# Patient Record
Sex: Male | Born: 1950 | Race: White | Hispanic: No | Marital: Married | Smoking: Never smoker
Health system: Southern US, Community
[De-identification: ages and names within clinical notes are randomized; demographics above are authoritative.]

## PROBLEM LIST (undated history)

## (undated) DIAGNOSIS — F0390 Unspecified dementia without behavioral disturbance: Secondary | ICD-10-CM

## (undated) DIAGNOSIS — N189 Chronic kidney disease, unspecified: Secondary | ICD-10-CM

## (undated) DIAGNOSIS — E109 Type 1 diabetes mellitus without complications: Secondary | ICD-10-CM

## (undated) DIAGNOSIS — G8929 Other chronic pain: Secondary | ICD-10-CM

## (undated) DIAGNOSIS — C61 Malignant neoplasm of prostate: Secondary | ICD-10-CM

## (undated) DIAGNOSIS — E039 Hypothyroidism, unspecified: Secondary | ICD-10-CM

## (undated) DIAGNOSIS — N183 Chronic kidney disease, stage 3 unspecified: Secondary | ICD-10-CM

## (undated) DIAGNOSIS — E079 Disorder of thyroid, unspecified: Secondary | ICD-10-CM

## (undated) DIAGNOSIS — M545 Other chronic pain: Secondary | ICD-10-CM

## (undated) DIAGNOSIS — N529 Male erectile dysfunction, unspecified: Secondary | ICD-10-CM

## (undated) DIAGNOSIS — G5603 Carpal tunnel syndrome, bilateral upper limbs: Secondary | ICD-10-CM

## (undated) DIAGNOSIS — E119 Type 2 diabetes mellitus without complications: Secondary | ICD-10-CM

## (undated) DIAGNOSIS — G629 Polyneuropathy, unspecified: Secondary | ICD-10-CM

## (undated) HISTORY — PX: FOOT SURGERY: SHX648

## (undated) HISTORY — DX: Malignant neoplasm of prostate: C61

## (undated) HISTORY — PX: APPENDECTOMY: SHX54

## (undated) HISTORY — PX: LUMBAR FUSION: SHX111

## (undated) HISTORY — DX: Disorder of thyroid, unspecified: E07.9

---

## 2000-06-14 HISTORY — PX: OTHER SURGICAL HISTORY: SHX169

## 2008-06-14 HISTORY — PX: PROSTATE CRYOABLATION: SUR358

## 2009-06-14 DIAGNOSIS — C61 Malignant neoplasm of prostate: Secondary | ICD-10-CM | POA: Insufficient documentation

## 2016-12-09 ENCOUNTER — Ambulatory Visit (INDEPENDENT_AMBULATORY_CARE_PROVIDER_SITE_OTHER): Payer: BLUE CROSS/BLUE SHIELD | Admitting: Urology

## 2016-12-09 ENCOUNTER — Encounter: Payer: Self-pay | Admitting: Urology

## 2016-12-09 VITALS — BP 127/71 | HR 73 | Ht 73.0 in | Wt 193.1 lb

## 2016-12-09 DIAGNOSIS — N5237 Erectile dysfunction following prostate ablative therapy: Secondary | ICD-10-CM

## 2016-12-09 DIAGNOSIS — R229 Localized swelling, mass and lump, unspecified: Secondary | ICD-10-CM

## 2016-12-09 DIAGNOSIS — C61 Malignant neoplasm of prostate: Secondary | ICD-10-CM | POA: Diagnosis not present

## 2016-12-09 NOTE — Progress Notes (Signed)
12/09/2016 10:06 AM   Laural Hess [birth year] [id number]  Referring provider: Glendon Axe, MD [facility] [place], [place] [address]  Chief Complaint  Patient presents with  . Erectile Dysfunction    HPI: The patient is a 66 year old gentleman with a past medical history of prostate cancer treated with cryosurgery in February 2011 presents today to discuss his erectile dysfunction.  1. Erectile dysfunction The patient has erectile dysfunction since cryotherapy for his prostate. He is not very concerned by this. His only concern was that he has been told by his previous urologist that this would help with his circulation. He is cold all the time, and his previous urologist told him that his erections would improve this. He is not currently sexually active and does not desire erection for intercourse.  2. History of prostate cancer   PSA was 0.02 in June 2018. He had cryotherapy in 2011 in [place], [place]. He is unaware of his Gleason score. Until today he had never heard the term before. He believes his pretreatment PSA was approximately 15. He states that when he was diagnosed with prostate cancer he was called in emergently and taken back for cryotherapy. He states that he was not informed of other treatment options including surgery or radiation therapy. He was told that this was an emergent surgery needed to treat his prostate cancer. He has never had imaging for his prostate cancer and again does not know what his Gleason score was.  3. Skin nodules The patient has skin nodules throughout his abdomen and chest that are superficial. He was told by his previous urologist that these are results of his cryotherapy and that they would eventually go away.   PMH: Past Medical History:  Diagnosis Date  . Prostate cancer ([place])   . Thyroid disease     Surgical History: Past Surgical History:  Procedure Laterality Date  . PROSTATE CRYOABLATION  2010     Home Medications:  Allergies as of 12/09/2016   No Known Allergies     Medication List       Accurate as of 12/09/16 10:06 AM. Always use your most recent med list.          cholecalciferol 1000 units tablet Commonly known as:  VITAMIN D Take by mouth.   levothyroxine 100 MCG tablet Commonly known as:  SYNTHROID, LEVOTHROID Take by mouth.   Vitamin B 12 100 MCG Lozg Take by mouth.       Allergies: No Known Allergies  Family History: Family History  Problem Relation Age of Onset  . Prostate cancer Neg Hx   . Bladder Cancer Neg Hx   . Kidney cancer Neg Hx     Social History:  reports that he has never smoked. He has never used smokeless tobacco. He reports that he drinks alcohol. He reports that he does not use drugs.  ROS: UROLOGY Frequent Urination?: No Hard to postpone urination?: No Burning/pain with urination?: No Get up at night to urinate?: No Leakage of urine?: No Urine stream starts and stops?: No Trouble starting stream?: No Do you have to strain to urinate?: No Blood in urine?: No Urinary tract infection?: No Sexually transmitted disease?: No Injury to kidneys or bladder?: No Painful intercourse?: No Weak stream?: No Erection problems?: Yes Penile pain?: No  Gastrointestinal Nausea?: No Vomiting?: No Indigestion/heartburn?: No Diarrhea?: No Constipation?: No  Constitutional Fever: No Night sweats?: No Weight loss?: No Fatigue?: No  Skin Skin rash/lesions?: No Itching?: No  Eyes Blurred vision?: No Double vision?: No  Ears/Nose/Throat Sore throat?: No Sinus problems?: No  Hematologic/Lymphatic Swollen glands?: No Easy bruising?: No  Cardiovascular Leg swelling?: No Chest pain?: No  Respiratory Cough?: No Shortness of breath?: No  Endocrine Excessive thirst?: No  Musculoskeletal Back pain?: No Joint pain?: No  Neurological Headaches?: No Dizziness?: No  Psychologic Depression?: No Anxiety?:  No  Physical Exam: BP 127/71 (BP Location: Left Arm, Patient Position: Sitting, Cuff Size: Normal)   Pulse 73   Ht 6\' 1"  (1.854 m)   Wt 193 lb 1.6 oz (87.6 kg)   BMI 25.48 kg/m   Constitutional:  Alert and oriented, No acute distress. HEENT: [place] AT, moist mucus membranes.  Trachea midline, no masses. Cardiovascular: No clubbing, cyanosis, or edema. Respiratory: Normal respiratory effort, no increased work of breathing. GI: Abdomen is soft, nontender, nondistended, no abdominal masses GU: No CVA tenderness.  Skin: No rashes, bruises or suspicious lesions. Lymph: No cervical or inguinal adenopathy. Neurologic: Grossly intact, no focal deficits, moving all 4 extremities. Psychiatric: Normal mood and affect.  Laboratory Data: No results found for: WBC, HGB, HCT, MCV, PLT  No results found for: CREATININE  No results found for: PSA  No results found for: TESTOSTERONE  No results found for: HGBA1C  Urinalysis No results found for: COLORURINE, APPEARANCEUR, LABSPEC, PHURINE, GLUCOSEU, HGBUR, BILIRUBINUR, KETONESUR, PROTEINUR, UROBILINOGEN, NITRITE, LEUKOCYTESUR   Assessment & Plan:    1. Erectile dysfunction I informed the patient that obtaining erection will not help with his circulation. Since he is not interested intercourse, I do not think he needs any intervention for this.  2. History of prostate cancer   -No evidence of disease. It is unclear what the patient's Gleason score was in the logic behind his emergent treatment for prostate cancer as this is not typically the case. We will try to obtain records from [place]. He'll follow-up with me annually.  3. Skin nodules I informed the patient and his skin nodules had no relationship to his cryotherapy.  Return in about 1 year (around 12/09/2017).  Nickie Retort, MD  [facility] [address] [place],  [address] [phone]

## 2017-05-31 DIAGNOSIS — N183 Chronic kidney disease, stage 3 unspecified: Secondary | ICD-10-CM | POA: Insufficient documentation

## 2017-05-31 DIAGNOSIS — E039 Hypothyroidism, unspecified: Secondary | ICD-10-CM | POA: Insufficient documentation

## 2017-12-08 ENCOUNTER — Encounter: Payer: Self-pay | Admitting: Urology

## 2017-12-08 ENCOUNTER — Ambulatory Visit: Payer: BLUE CROSS/BLUE SHIELD | Admitting: Urology

## 2017-12-28 DIAGNOSIS — M545 Low back pain, unspecified: Secondary | ICD-10-CM | POA: Insufficient documentation

## 2017-12-29 ENCOUNTER — Other Ambulatory Visit: Payer: Self-pay | Admitting: Internal Medicine

## 2017-12-29 DIAGNOSIS — N183 Chronic kidney disease, stage 3 unspecified: Secondary | ICD-10-CM

## 2018-01-04 ENCOUNTER — Ambulatory Visit
Admission: RE | Admit: 2018-01-04 | Discharge: 2018-01-04 | Disposition: A | Discharge status: Home / Self Care | Payer: Medicare Other | Source: Ambulatory Visit | Attending: Internal Medicine | Admitting: Internal Medicine

## 2018-01-04 DIAGNOSIS — N183 Chronic kidney disease, stage 3 unspecified: Secondary | ICD-10-CM

## 2018-03-21 IMAGING — US US RENAL
1 series · 14 of 25 positions shown · non-contrast
Comparison: None.

CLINICAL DATA: Chronic kidney disease stage 3

EXAM:
RENAL / URINARY TRACT ULTRASOUND COMPLETE

[Series 1: us renal · 0.25mm/px · 14 of 36 slices shown]
[im 1/36]
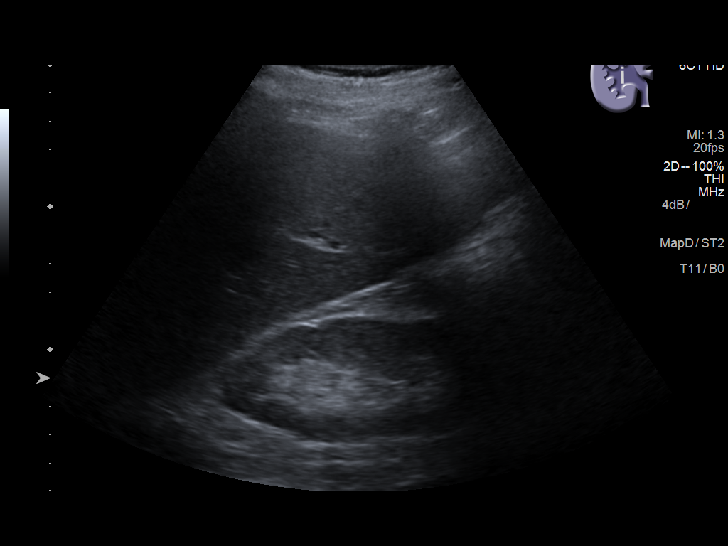
[im 3/36]
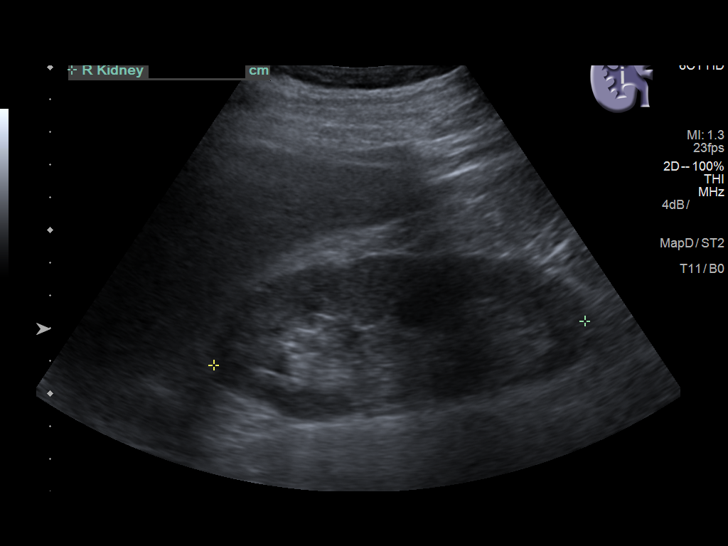
[im 6/36]
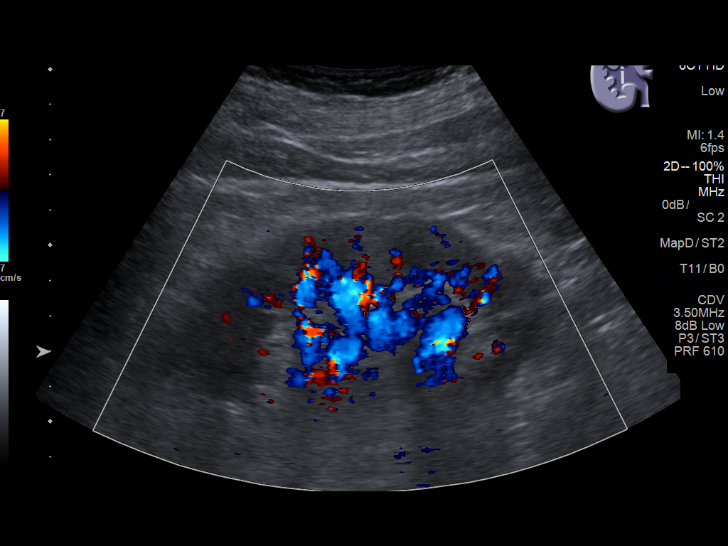
[im 9/36]
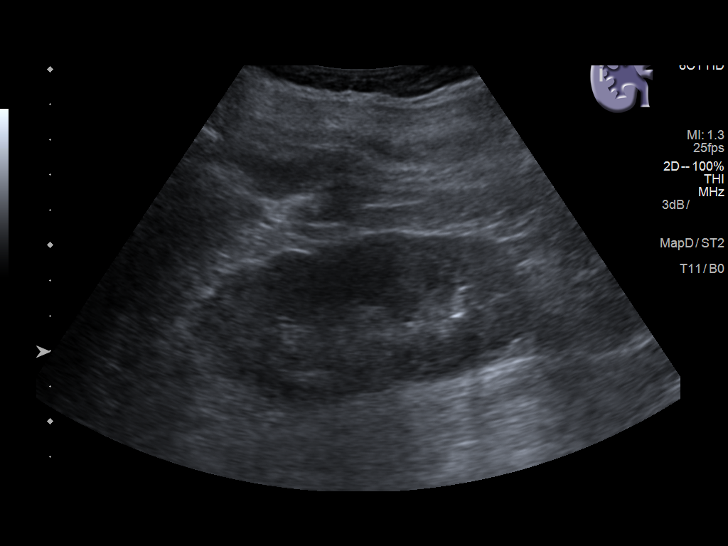
[im 12/36]
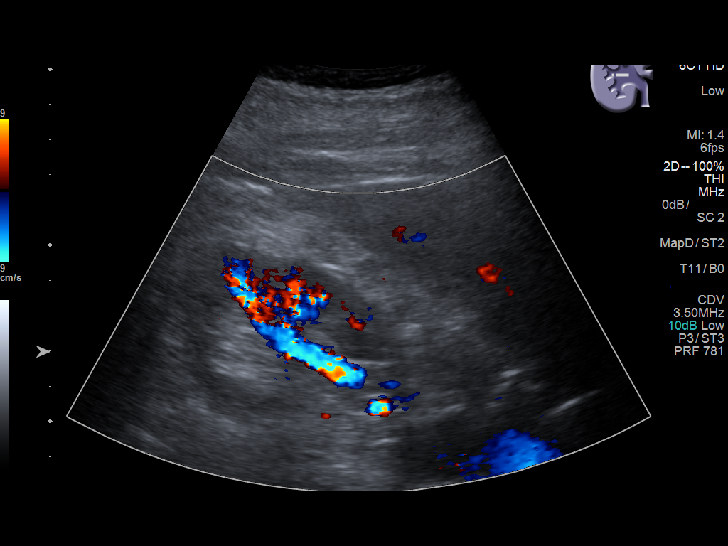
[im 14/36]
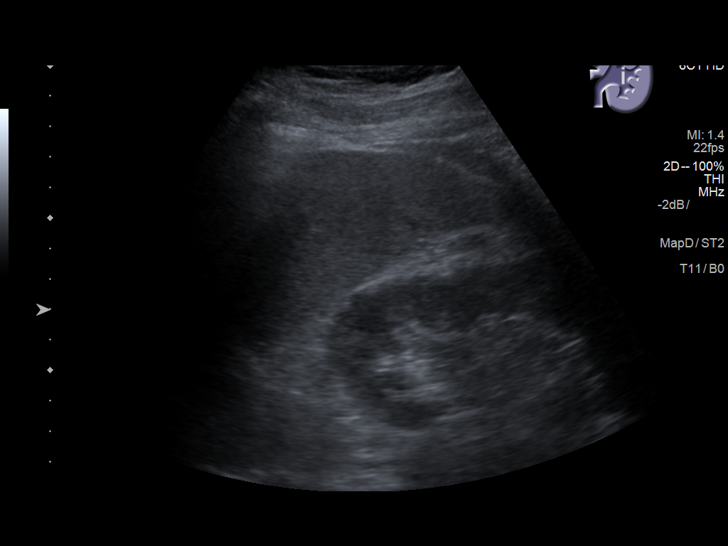
[im 17/36]
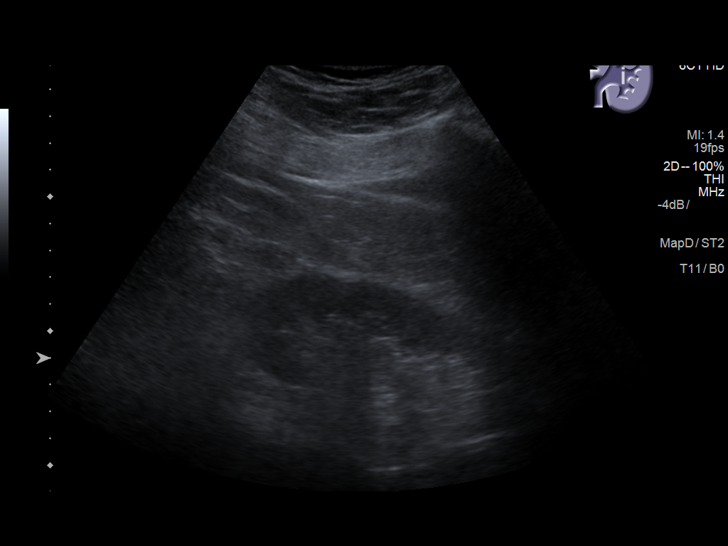
[im 19/36]
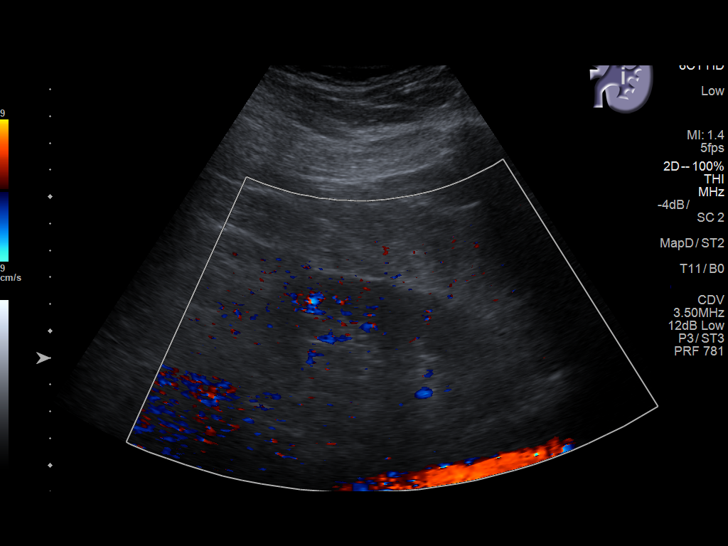
[im 22/36]
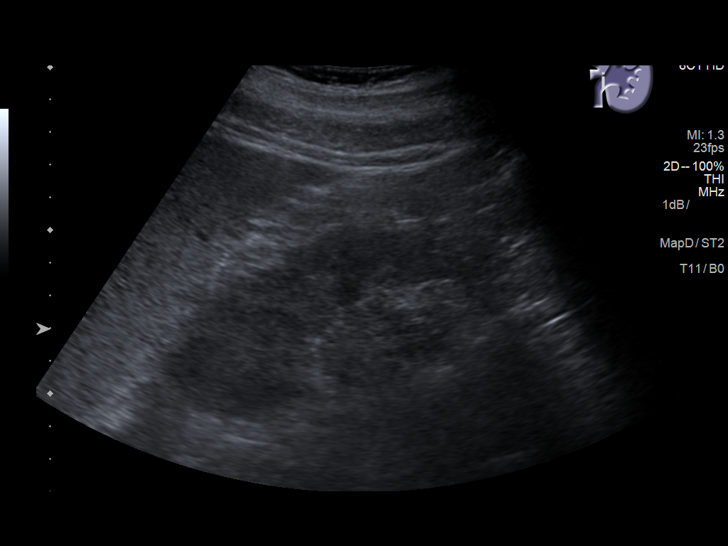
[im 24/36]
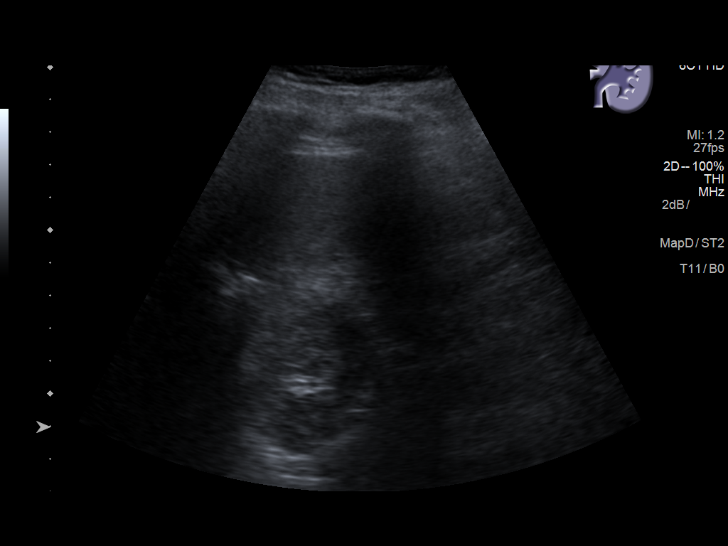
[im 27/36]
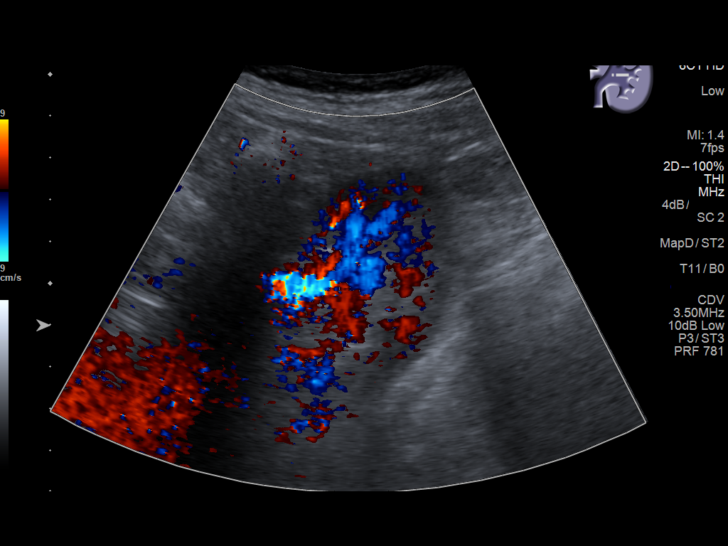
[im 30/36]
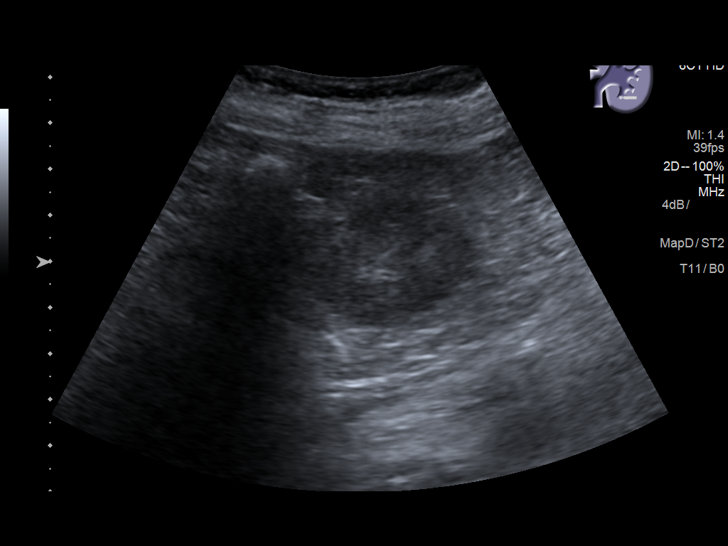
[im 33/36]
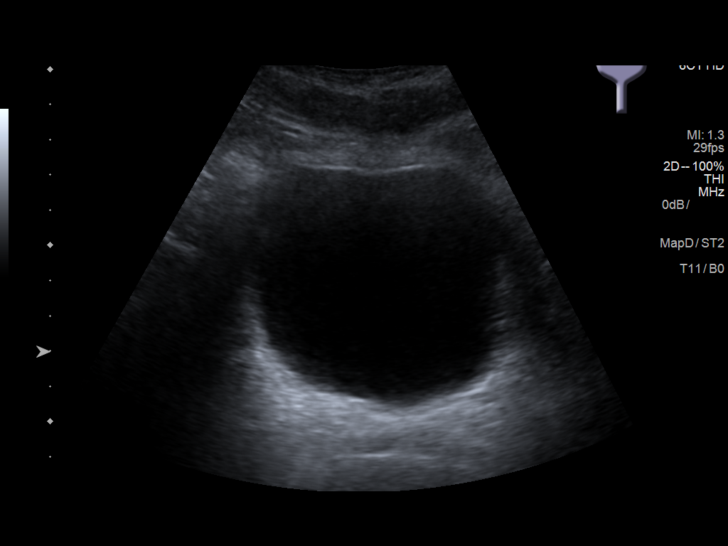
[im 36/36]
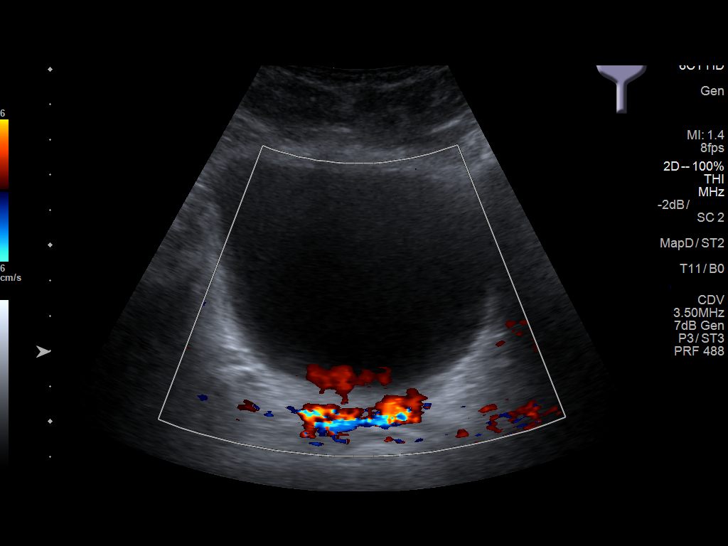

[14 of 25 positions shown; findings below may reference images not displayed]

FINDINGS: Right Kidney:

Length: 11.4 cm. Echogenicity within normal limits. No mass or
hydronephrosis visualized.

Left Kidney:

Length: 11.5 cm. Echogenicity within normal limits. No mass or
hydronephrosis visualized.

Bladder:

Appears normal for degree of bladder distention.
IMPRESSION: Normal renal ultrasound

## 2019-04-28 ENCOUNTER — Emergency Department: Payer: Medicare Other

## 2019-04-28 ENCOUNTER — Other Ambulatory Visit: Payer: Self-pay

## 2019-04-28 ENCOUNTER — Encounter: Payer: Self-pay | Admitting: Emergency Medicine

## 2019-04-28 ENCOUNTER — Emergency Department
Admission: EM | Admit: 2019-04-28 | Discharge: 2019-04-28 | Disposition: A | Discharge status: Home / Self Care | Payer: Medicare Other | Attending: Emergency Medicine | Admitting: Emergency Medicine

## 2019-04-28 DIAGNOSIS — Z20828 Contact with and (suspected) exposure to other viral communicable diseases: Secondary | ICD-10-CM | POA: Diagnosis not present

## 2019-04-28 DIAGNOSIS — R112 Nausea with vomiting, unspecified: Secondary | ICD-10-CM | POA: Diagnosis not present

## 2019-04-28 DIAGNOSIS — R519 Headache, unspecified: Secondary | ICD-10-CM | POA: Diagnosis not present

## 2019-04-28 LAB — COMPREHENSIVE METABOLIC PANEL
ALT: 20 U/L (ref 0–44)
AST: 20 U/L (ref 15–41)
Albumin: 4.1 g/dL (ref 3.5–5.0)
Alkaline Phosphatase: 75 U/L (ref 38–126)
Anion gap: 9 (ref 5–15)
BUN: 15 mg/dL (ref 8–23)
CO2: 23 mmol/L (ref 22–32)
Calcium: 8.7 mg/dL — ABNORMAL LOW (ref 8.9–10.3)
Chloride: 107 mmol/L (ref 98–111)
Creatinine, Ser: 1.2 mg/dL (ref 0.61–1.24)
GFR calc Af Amer: >60 mL/min (ref >60)
GFR calc non Af Amer: >60 mL/min (ref >60)
Glucose, Bld: 96 mg/dL (ref 70–99)
Potassium: 3.6 mmol/L (ref 3.5–5.1)
Sodium: 139 mmol/L (ref 135–145)
Total Bilirubin: 0.6 mg/dL (ref 0.3–1.2)
Total Protein: 7.2 g/dL (ref 6.5–8.1)

## 2019-04-28 LAB — URINALYSIS, COMPLETE (UACMP) WITH MICROSCOPIC
Bacteria, UA: NONE SEEN
Bilirubin Urine: NEGATIVE
Glucose, UA: NEGATIVE mg/dL
Hgb urine dipstick: NEGATIVE
Ketones, ur: NEGATIVE mg/dL
Leukocytes,Ua: NEGATIVE
Nitrite: NEGATIVE
Protein, ur: NEGATIVE mg/dL
Specific Gravity, Urine: 1.014 (ref 1.005–1.030)
Squamous Epithelial / HPF: NONE SEEN (ref 0–5)
pH: 6 (ref 5.0–8.0)

## 2019-04-28 LAB — LIPASE, BLOOD: Lipase: 26 U/L (ref 11–51)

## 2019-04-28 LAB — CK: Total CK: 90 U/L (ref 49–397)

## 2019-04-28 LAB — CBC
HCT: 40.6 % (ref 39.0–52.0)
Hemoglobin: 14.1 g/dL (ref 13.0–17.0)
MCH: 29.1 pg (ref 26.0–34.0)
MCHC: 34.7 g/dL (ref 30.0–36.0)
MCV: 83.7 fL (ref 80.0–100.0)
Platelets: 205 10*3/uL (ref 150–400)
RBC: 4.85 MIL/uL (ref 4.22–5.81)
RDW: 12.5 % (ref 11.5–15.5)
WBC: 5.3 10*3/uL (ref 4.0–10.5)
nRBC: 0 % (ref 0.0–0.2)

## 2019-04-28 LAB — INFLUENZA PANEL BY PCR (TYPE A & B)
Influenza A By PCR: NEGATIVE
Influenza B By PCR: NEGATIVE

## 2019-04-28 LAB — SARS CORONAVIRUS 2 (TAT 6-24 HRS): SARS Coronavirus 2: NEGATIVE

## 2019-04-28 IMAGING — CR DG CHEST 1V
1 series · 1 of 1 positions shown · non-contrast
Comparison: Report dated [DATE].

CLINICAL DATA: Fatigue. Low back pain.

EXAM:
CHEST  1 VIEW

[dg chest 1 view]
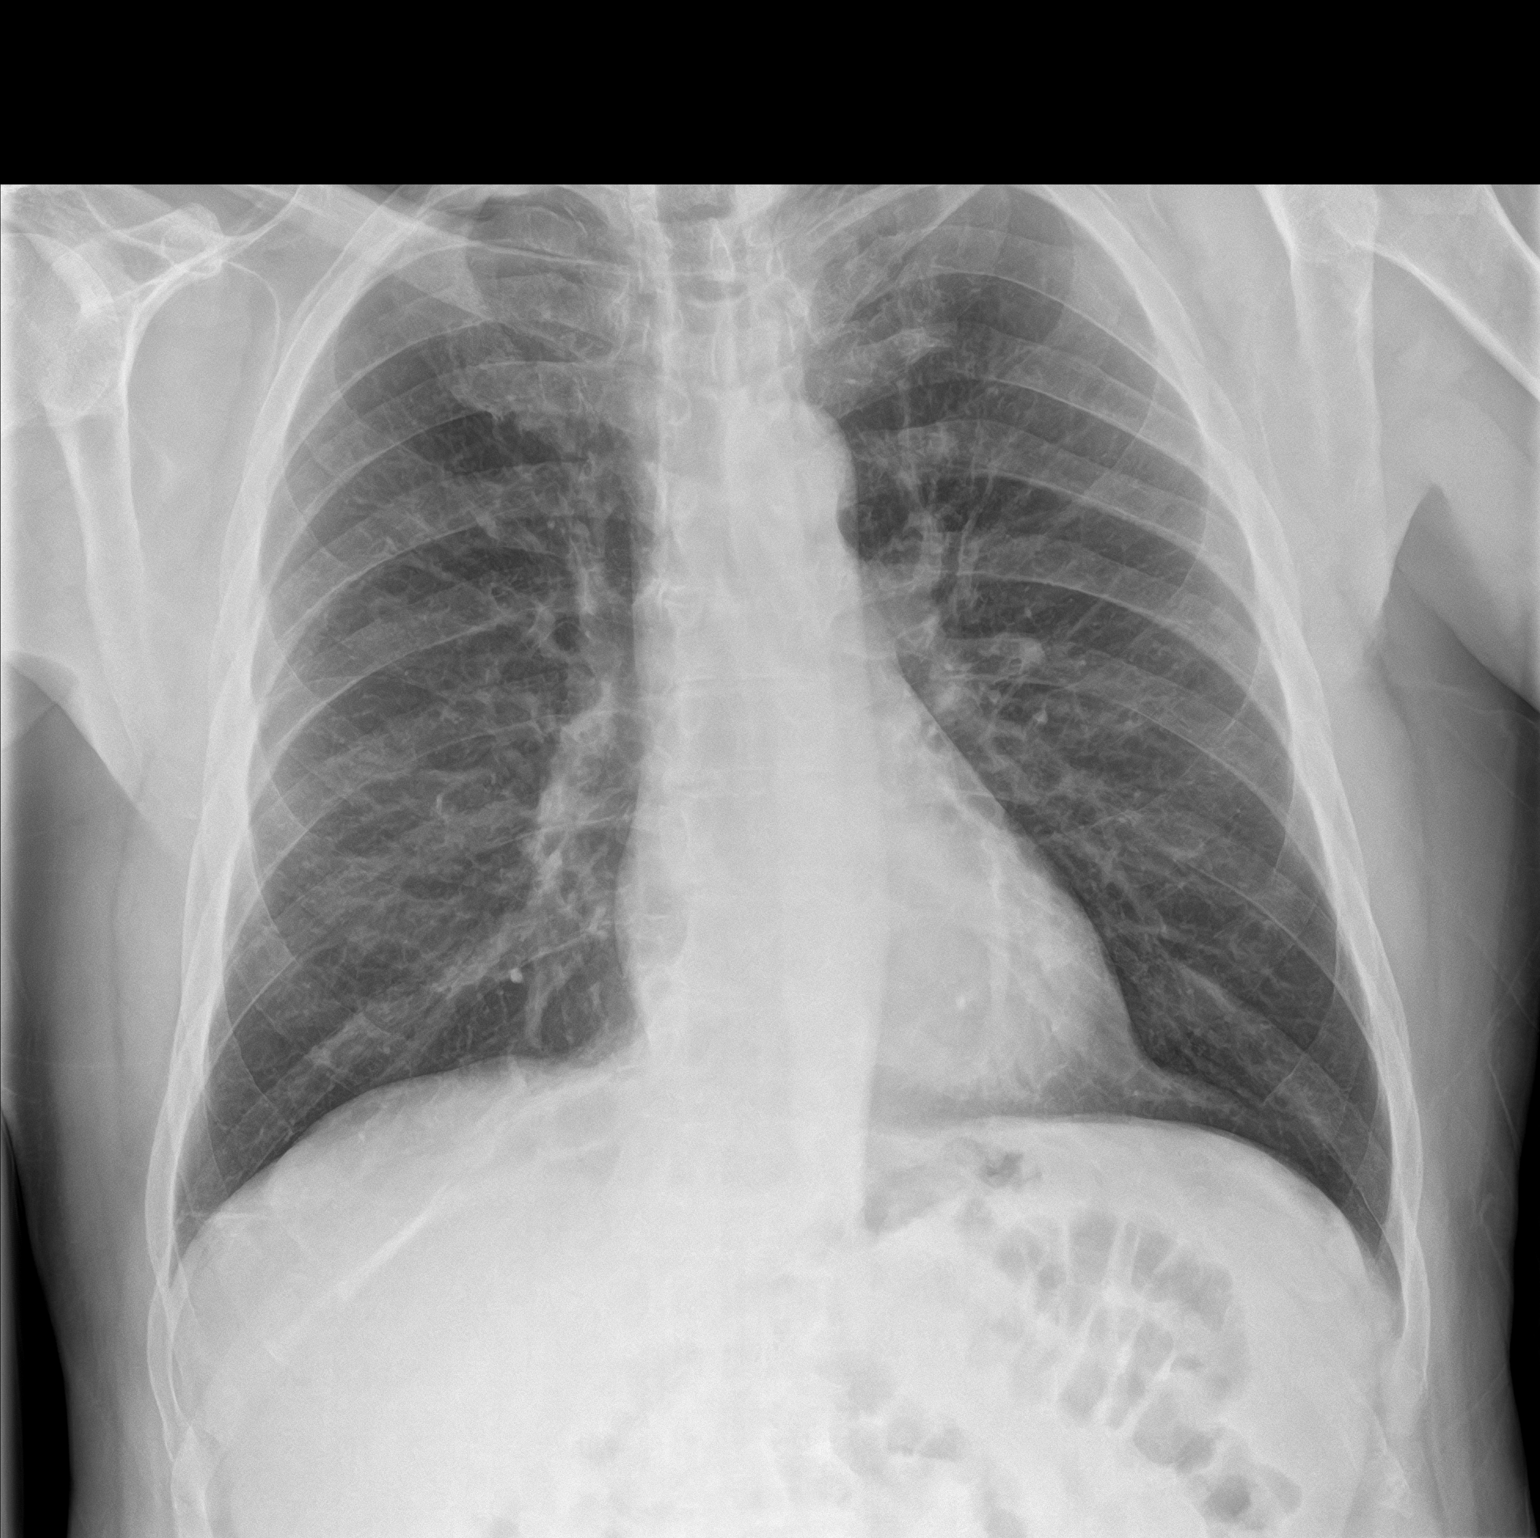

[1 of 1 positions shown; findings below may reference images not displayed]

FINDINGS: Normal sized heart. Clear lungs. The lungs are mildly hyperexpanded.
Unremarkable bones.
IMPRESSION: No acute abnormality. Mild changes of COPD.

## 2019-04-28 IMAGING — CT CT HEAD W/O CM
3 series · 15 of 46 positions shown, 18 images · non-contrast
Comparison: None.

CLINICAL DATA: Low back pain, headache and fatigue for 1 week.
Dizziness with rapid head movement. History of prostate cancer.

EXAM:
CT HEAD WITHOUT CONTRAST
TECHNIQUE: Contiguous axial images were obtained from the base of the skull
through the vertex without intravenous contrast.

[Series 3: head wo · axial · 0.43mm/px · z∈[+491,+611]mm · 9 of 29 slices shown, 12 images]
[im 3/29  brain]
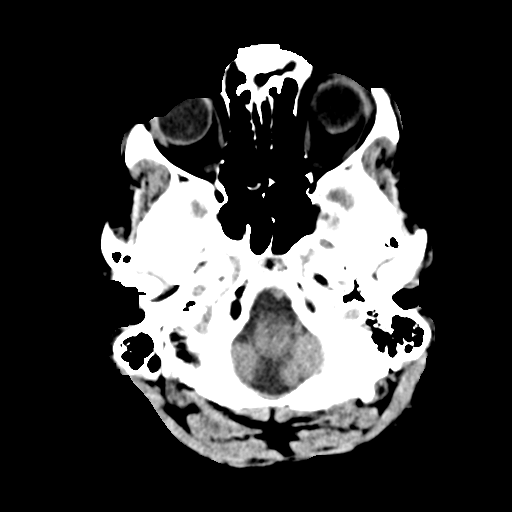
[im 3/29  bone]
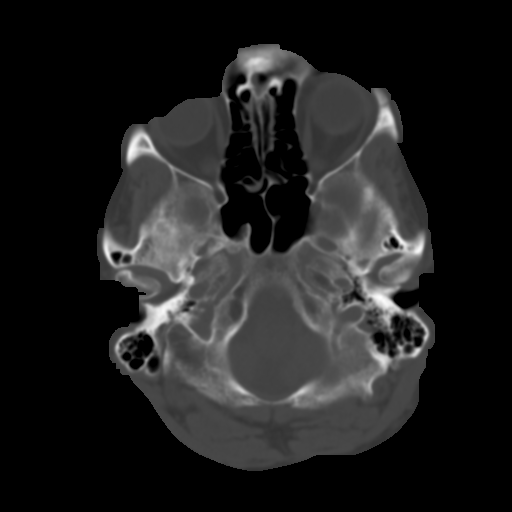
[im 6/29  brain]
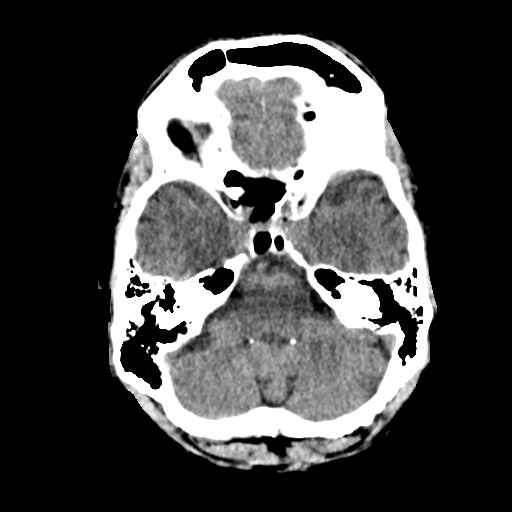
[im 9/29  brain]
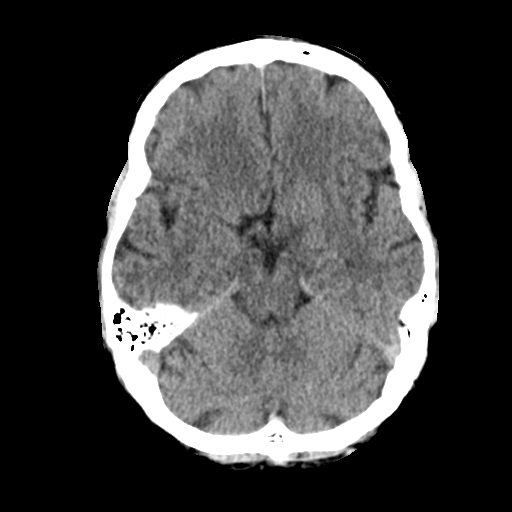
[im 12/29  brain]
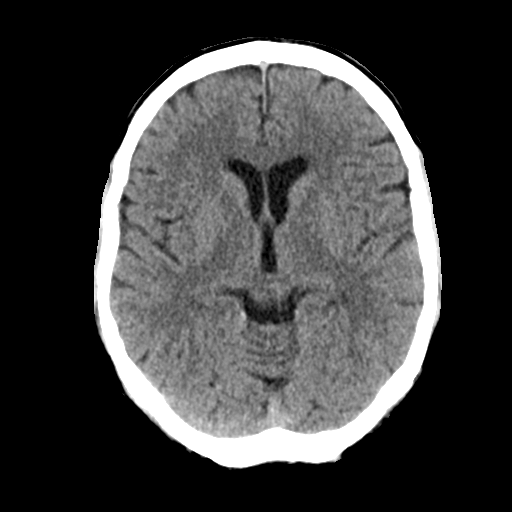
[im 15/29  brain]
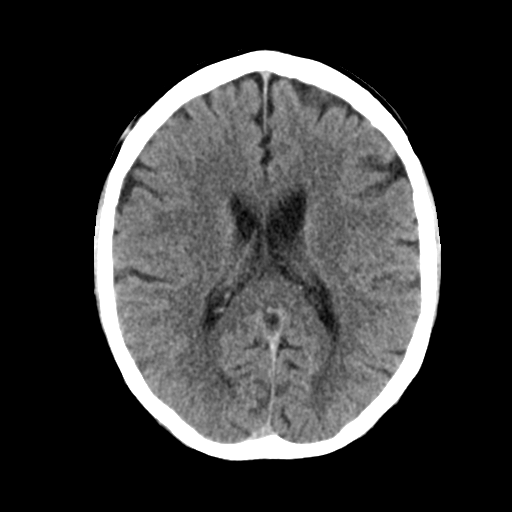
[im 15/29  bone]
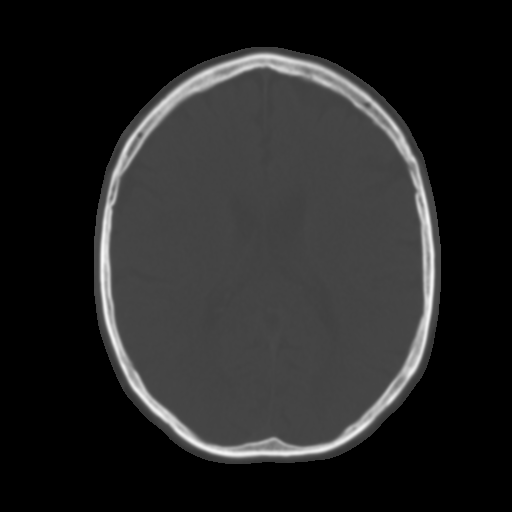
[im 18/29  brain]
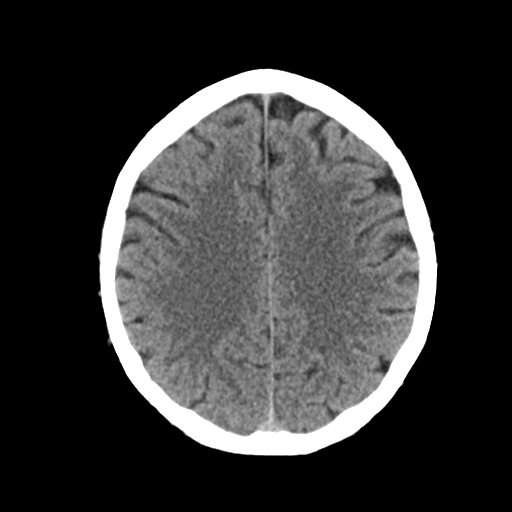
[im 21/29  brain]
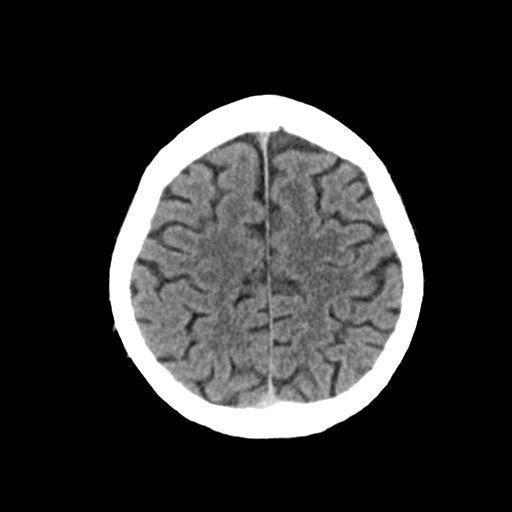
[im 24/29  brain]
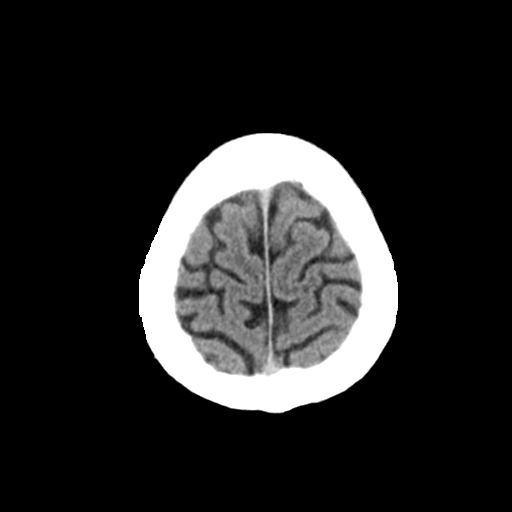
[im 27/29  brain]
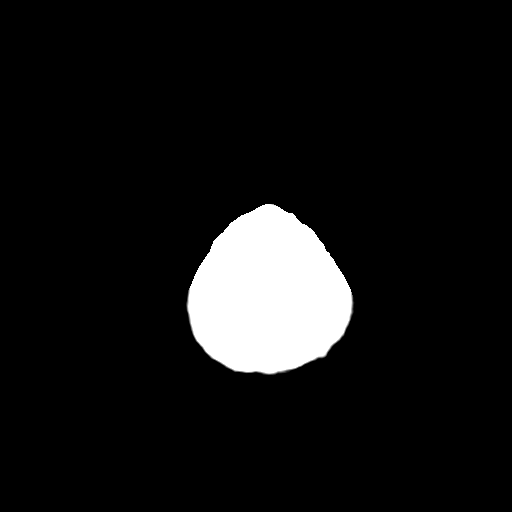
[im 27/29  bone]
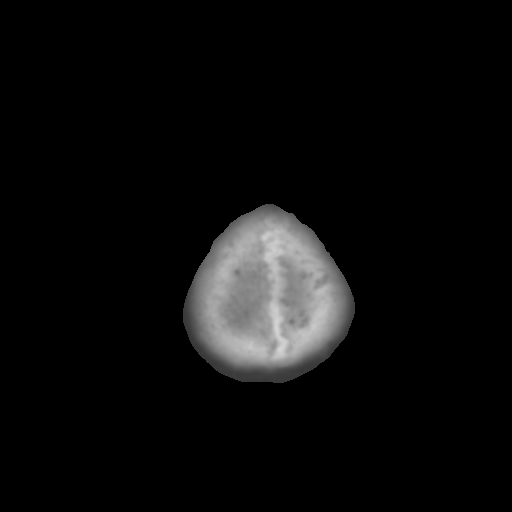

[Series 4: coronal soft tissue · coronal · 0.28mm/px · 3 of 65 slices shown]
[im 22/65  brain]
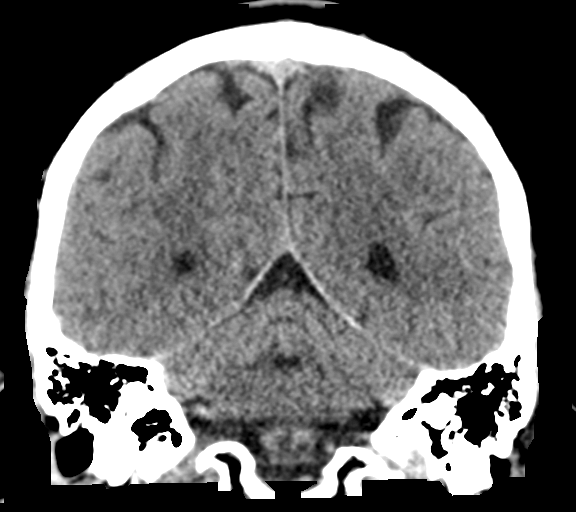
[im 29/65  brain]
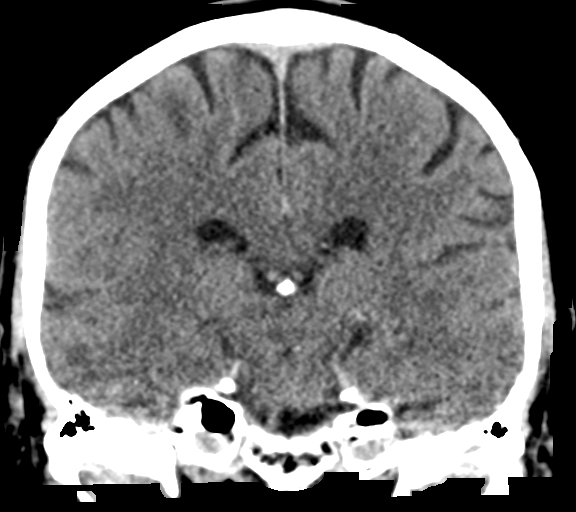
[im 36/65  brain]
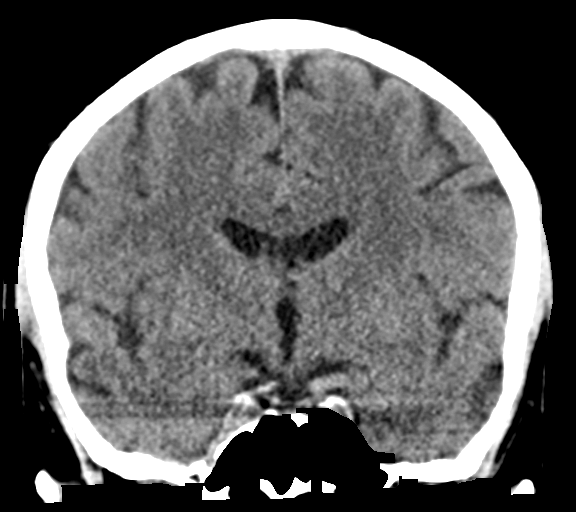

[Series 5: sagittal soft tissue · sagittal · 0.28mm/px · 3 of 54 slices shown]
[im 18/54  brain]
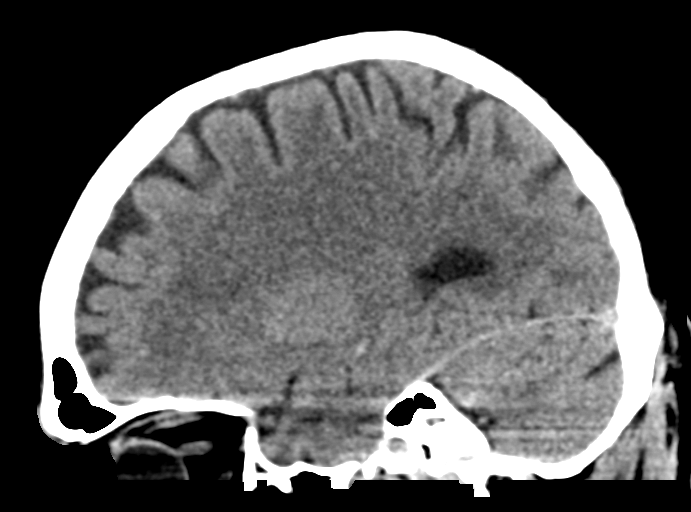
[im 27/54  brain]
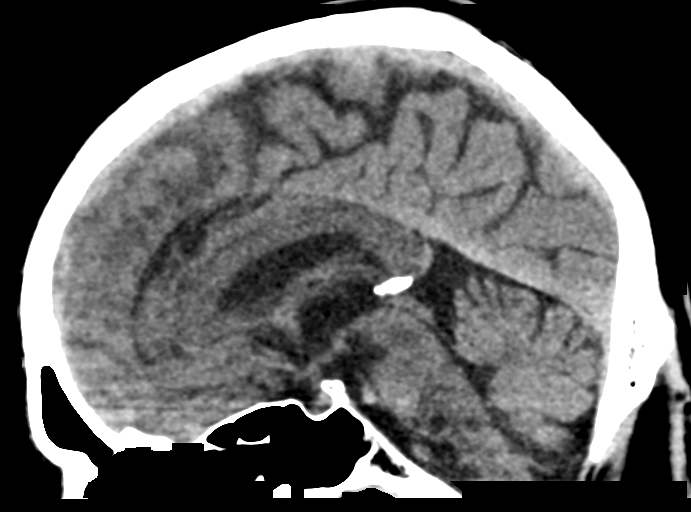
[im 36/54  brain]
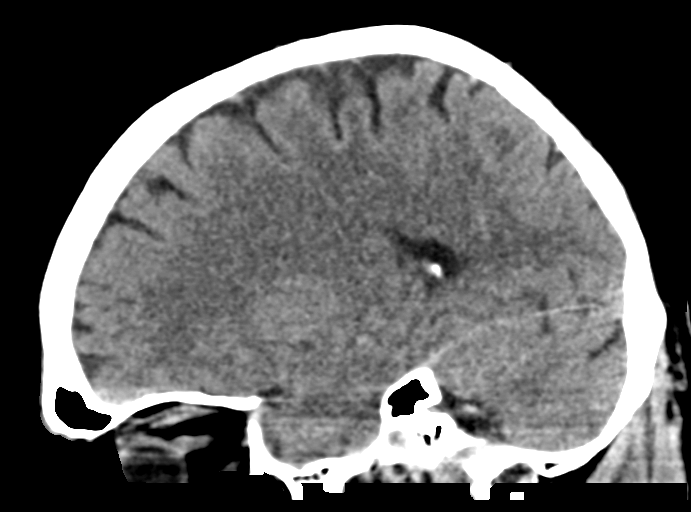

[15 of 46 positions shown; findings below may reference images not displayed]

FINDINGS: Brain: Minimally enlarged ventricles and subarachnoid spaces. No
intracranial hemorrhage, mass lesion or CT evidence of acute
infarction.

Vascular: No hyperdense vessel or unexpected calcification.

Skull: Normal. Negative for fracture or focal lesion.

Sinuses/Orbits: Unremarkable.

Other: None.
IMPRESSION: No acute abnormality. Minimal diffuse cerebral and cerebellar
atrophy.

## 2019-04-28 MED ORDER — ONDANSETRON HCL 4 MG PO TABS: 4.0000 mg | ORAL_TABLET | Freq: Three times a day (TID) | ORAL | 0 refills | Status: DC | PRN

## 2019-04-28 MED ORDER — TRAMADOL HCL 50 MG PO TABS: 50.0000 mg | ORAL_TABLET | Freq: Four times a day (QID) | ORAL | 0 refills | Status: AC | PRN

## 2019-04-28 NOTE — Discharge Instructions (Signed)
Take Zofran as needed for nausea. You have also been prescribed tramadol, which is a medicine that can be taken for headache. You should have COVID-19 results back in 1 to 2 days. Please return to the emergency department with new or worsening symptoms.

## 2019-04-28 NOTE — ED Provider Notes (Signed)
[facility] Emergency Department Provider Note  ____________________________________________  Time seen: Approximately 4:09 PM  I have reviewed the triage vital signs and the nursing notes.   HISTORY  Chief Complaint Emesis and Headache    HPI Jared Hess is a 68 y.o. male with a history of hypothyroidism, presents to the emergency department with multiple medical complaints that he has experienced for the past week.  Patient states that he is primarily concerned about constant headache that wraps around his head like a band with associated nausea and 2 episodes of vomiting that occurred last night.  Patient states that he is also had diarrhea for the past 24 hours and low back pain that started at the beginning of the week.  Patient states that he is also experienced some unusual malaise.  Patient owns a Biomedical scientist business and states that he lives an active lifestyle and does not seek medical care often.  Patient states that he has had hypothyroidism most of his adult life and states that his medication is well titrated and his thyroid labs were last assessed in the spring 2020.  He states that he has been under unusual stresses his businesses slower than what it normally is.  He has no sick contacts in his home and states that he has no sick contacts to his knowledge.  He denies rhinorrhea, nasal congestion, nonproductive cough and sore throat.  He has had no chest pain and chest tightness.  He does state that he has had some epigastric discomfort but no real pain.  No hemoptysis or hematochezia.  No other alleviating measures have been attempted.  Patient states that he does not take medication unnecessarily.        Past Medical History:  Diagnosis Date  . Prostate cancer ([place])   . Thyroid disease     There are no active problems to display for this patient.   Past Surgical History:  Procedure Laterality Date  . PROSTATE CRYOABLATION  2010    Prior  to Admission medications   Medication Sig Start Date End Date Taking? Authorizing Provider  diclofenac Sodium (VOLTAREN) 1 % GEL Apply 2 g topically 2 (two) times daily. 07/24/18 07/24/19 Yes [provider]  levothyroxine (SYNTHROID) 112 MCG tablet Take 112 mcg by mouth daily. 01/30/19   [provider]  ondansetron (ZOFRAN) 4 MG tablet Take 1 tablet (4 mg total) by mouth every 8 (eight) hours as needed for nausea or vomiting. 04/28/19   Lannie Fields, PA-C  traMADol (ULTRAM) 50 MG tablet Take 1 tablet (50 mg total) by mouth every 6 (six) hours as needed for up to 3 days. 04/28/19 05/01/19  Lannie Fields, PA-C    Allergies Patient has no known allergies.  Family History  Problem Relation Age of Onset  . Prostate cancer Neg Hx   . Bladder Cancer Neg Hx   . Kidney cancer Neg Hx     Social History Social History   Tobacco Use  . Smoking status: Never Smoker  . Smokeless tobacco: Never Used  Substance Use Topics  . Alcohol use: Yes  . Drug use: No     Review of Systems  Constitutional: No fever/chills Eyes: No visual changes. No discharge ENT: No upper respiratory complaints. Cardiovascular: no chest pain. Respiratory: no cough. No SOB. Gastrointestinal: No abdominal pain.  Patient has had nausea, emesis and diarrhea.  No diarrhea.  No constipation. Genitourinary: Negative for dysuria. No hematuria Musculoskeletal: Patient has body aches. Skin: Negative for  rash, abrasions, lacerations, ecchymosis. Neurological: Patient has headache.   ____________________________________________   PHYSICAL EXAM:  VITAL SIGNS: ED Triage Vitals  Enc Vitals Group     BP 04/28/19 1256 136/90     Pulse Rate 04/28/19 1256 75     Resp 04/28/19 1256 16     Temp 04/28/19 1256 97.7 F (36.5 C)     Temp Source 04/28/19 1256 Oral     SpO2 04/28/19 1256 98 %     Weight 04/28/19 1258 184 lb (83.5 kg)     Height 04/28/19 1258 6\' 1"  (1.854 m)     Head Circumference --       Peak Flow --      Pain Score 04/28/19 1257 5     Pain Loc --      Pain Edu? --      Excl. in [place]? --      Constitutional: Alert and oriented. Well appearing and in no acute distress. Eyes: Conjunctivae are normal. PERRL. EOMI. Head: Atraumatic. ENT:      Ears: TMs are effused bilaterally.      Nose: No congestion/rhinnorhea.      Mouth/Throat: Mucous membranes are moist.  Neck: No stridor.  No cervical spine tenderness to palpation. Cardiovascular: Normal rate, regular rhythm. Normal S1 and S2.  Good peripheral circulation. Respiratory: Normal respiratory effort without tachypnea or retractions. Lungs CTAB. Good air entry to the bases with no decreased or absent breath sounds. Gastrointestinal: Bowel sounds 4 quadrants. Soft and nontender to palpation. No guarding or rigidity. No palpable masses. No distention. No CVA tenderness. Musculoskeletal: Full range of motion to all extremities. No gross deformities appreciated. Neurologic:  Normal speech and language. No gross focal neurologic deficits are appreciated.  Skin:  Skin is warm, dry and intact. No rash noted. Psychiatric: Mood and affect are normal. Speech and behavior are normal. Patient exhibits appropriate insight and judgement.   ____________________________________________   LABS (all labs ordered are listed, but only abnormal results are displayed)  Labs Reviewed  COMPREHENSIVE METABOLIC PANEL - Abnormal; Notable for the following components:      Result Value   Calcium 8.7 (*)    All other components within normal limits  URINALYSIS, COMPLETE (UACMP) WITH MICROSCOPIC - Abnormal; Notable for the following components:   Color, Urine YELLOW (*)    APPearance CLEAR (*)    All other components within normal limits  SARS CORONAVIRUS 2 (TAT 6-24 HRS)  LIPASE, BLOOD  CBC  CK  INFLUENZA PANEL BY PCR (TYPE A & B)    ____________________________________________  EKG   ____________________________________________  RADIOLOGY I personally viewed and evaluated these images as part of my medical decision making, as well as reviewing the written report by the radiologist.    Dg Chest 1 View  Result Date: 04/28/2019 CLINICAL DATA:  Fatigue. Low back pain. EXAM: CHEST  1 VIEW COMPARISON:  Report dated 07/24/2018. FINDINGS: Normal sized heart. Clear lungs. The lungs are mildly hyperexpanded. Unremarkable bones. IMPRESSION: No acute abnormality. Mild changes of COPD. Electronically Signed   By: Claudie Revering M.D.   On: 04/28/2019 16:42   Ct Head Wo Contrast  Result Date: 04/28/2019 CLINICAL DATA:  Low back pain, headache and fatigue for 1 week. Dizziness with rapid head movement. History of prostate cancer. EXAM: CT HEAD WITHOUT CONTRAST TECHNIQUE: Contiguous axial images were obtained from the base of the skull through the vertex without intravenous contrast. COMPARISON:  None. FINDINGS: Brain: Minimally enlarged ventricles and subarachnoid spaces. No  intracranial hemorrhage, mass lesion or CT evidence of acute infarction. Vascular: No hyperdense vessel or unexpected calcification. Skull: Normal. Negative for fracture or focal lesion. Sinuses/Orbits: Unremarkable. Other: None. IMPRESSION: No acute abnormality. Minimal diffuse cerebral and cerebellar atrophy. Electronically Signed   By: Claudie Revering M.D.   On: 04/28/2019 16:30    ____________________________________________    PROCEDURES  Procedure(s) performed:    Procedures    Medications - No data to display   ____________________________________________   INITIAL IMPRESSION / ASSESSMENT AND PLAN / ED COURSE  Pertinent labs & imaging results that were available during my care of the patient were reviewed by me and considered in my medical decision making (see chart for details).  Review of the [place] CSRS was performed in accordance of the  [place] prior to dispensing any controlled drugs.           Assessment and plan Headache,  Nausea Diarrhea Low back pain  68 year old male presents to the emergency department with headache, malaise, low back pain, diarrhea and nausea for approximately 1 week.  Patient was mildly bradycardic at triage but vital signs were otherwise reassuring.  On physical exam, patient was alert and active and provided assisting to history.  He does not have an extensive past medical history and states that persistence of current symptoms have prompted him to seek care in the emergency department.  TMs were effused bilaterally.  Neuro exam was reassuring without acute deficits.  Had no abdominal tenderness or guarding elicited on palpation.  Differential diagnosis included COVID-19, influenza, rhabdo, intracranial bleed, anxiety, cystitis...  CBC returned without leukopenia or leukocytosis.  No concerning findings on CMP.  Urinalysis was noncontributory for cystitis.  Lipase was within reference range.  Plan is to obtain CT head, chest x-ray and testing for COVID-19 and influenza and will reassess....  CT head was reassuring without intracranial bleed.  No concerning findings on chest x-ray.  COVID-19 and flu testing are in process.  Patient was discharged with a short course of tramadol and Zofran.  He was given return precautions to return to the emergency department with new or worsening symptoms.  He voiced understanding.   ____________________________________________  FINAL CLINICAL IMPRESSION(S) / ED DIAGNOSES  Final diagnoses:  Acute nonintractable headache, unspecified headache type      NEW MEDICATIONS STARTED DURING THIS VISIT:  ED Discharge Orders         Ordered    ondansetron (ZOFRAN) 4 MG tablet  Every 8 hours PRN     04/28/19 1736    traMADol (ULTRAM) 50 MG tablet  Every 6 hours PRN     04/28/19 1736              This chart was dictated using voice recognition  software/Dragon. Despite best efforts to proofread, errors can occur which can change the meaning. Any change was purely unintentional.    Lannie Fields, PA-C 04/28/19 1755    Nena Polio, MD 04/28/19 [phone]

## 2019-04-28 NOTE — ED Triage Notes (Addendum)
Patient presents to the ED with lower back pain, headache and fatigue x 1 week.  Patient states, "If I move my head really fast, I feel dizzy."  Patient states, "It's just not my normal, I'm not sure what's going on."  Patient reports 2 episodes of vomiting and 3 episodes of diarrhea in the past 24 hours.

## 2019-04-28 NOTE — ED Notes (Signed)
Patient was in a hurry to leave due to having a Zoom meeting at 1800.

## 2019-04-28 NOTE — ED Notes (Signed)
CT tech was contacted and will come to transport patient to CT before swabs are obtained.

## 2019-07-31 ENCOUNTER — Other Ambulatory Visit: Payer: Self-pay | Admitting: Internal Medicine

## 2019-07-31 DIAGNOSIS — R270 Ataxia, unspecified: Secondary | ICD-10-CM

## 2019-07-31 DIAGNOSIS — H532 Diplopia: Secondary | ICD-10-CM

## 2019-08-09 ENCOUNTER — Ambulatory Visit
Admission: RE | Admit: 2019-08-09 | Discharge: 2019-08-09 | Disposition: A | Discharge status: Home / Self Care | Payer: Medicare Other | Source: Ambulatory Visit | Attending: Internal Medicine | Admitting: Internal Medicine

## 2019-08-09 ENCOUNTER — Other Ambulatory Visit: Payer: Self-pay

## 2019-08-09 DIAGNOSIS — H532 Diplopia: Secondary | ICD-10-CM

## 2019-08-09 DIAGNOSIS — R270 Ataxia, unspecified: Secondary | ICD-10-CM | POA: Diagnosis present

## 2019-08-09 LAB — POCT I-STAT CREATININE: Creatinine, Ser: 1.5 mg/dL — ABNORMAL HIGH (ref 0.61–1.24)

## 2019-08-09 IMAGING — MR MR HEAD WO/W CM
11 of 12 series · 45 of 48 positions shown · IV contrast (gadavist)
Comparison: None.

CLINICAL DATA: Diplopia for 2 months.

EXAM:
MRI HEAD WITHOUT AND WITH CONTRAST
TECHNIQUE: Multiplanar, multiecho pulse sequences of the brain and surrounding
structures were obtained without and with intravenous contrast.
CONTRAST:  8mL GADAVIST GADOBUTROL 1 MMOL/ML IV SOLN

[Series 5: ax dwi_tracew · axial · 3.0mm · 0.71mm/px · z∈[-97,+64]mm · 7 of 56 slices shown]
[im 1/56]
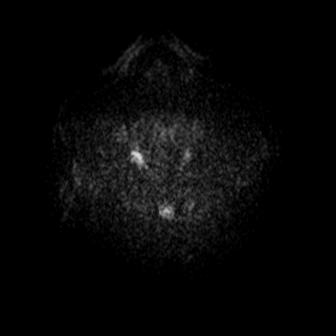
[im 10/56]
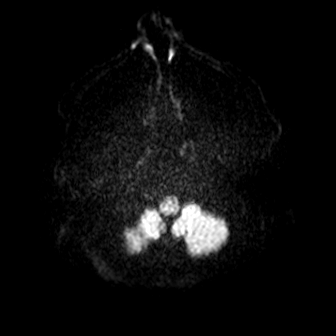
[im 19/56]
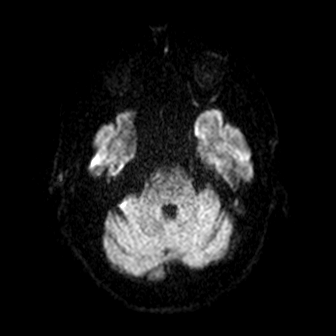
[im 28/56]
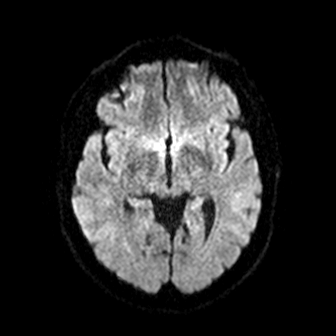
[im 37/56]
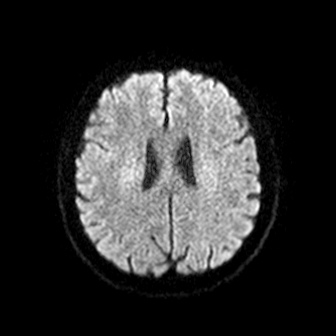
[im 46/56]
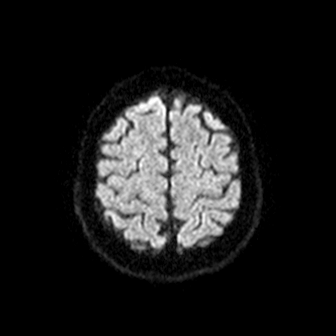
[im 56/56]
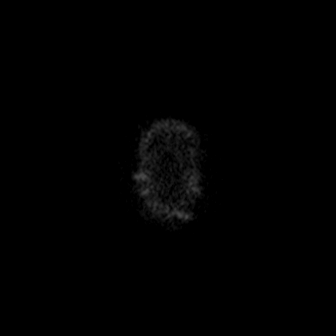

[Series 6: ax dwi_adc · axial · 3.0mm · 0.71mm/px · z∈[-97,+64]mm · 7 of 56 slices shown]
[im 1/56]
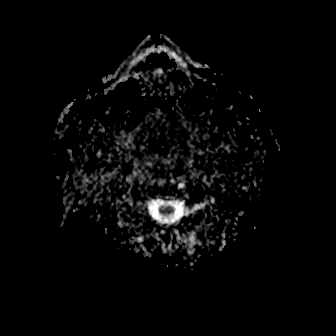
[im 10/56]
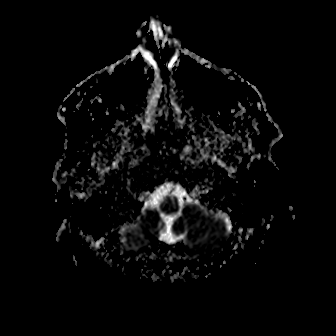
[im 19/56]
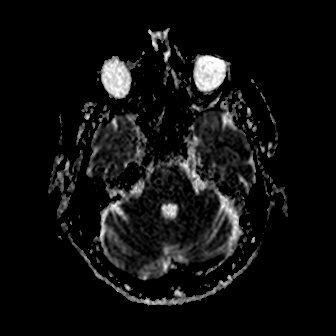
[im 28/56]
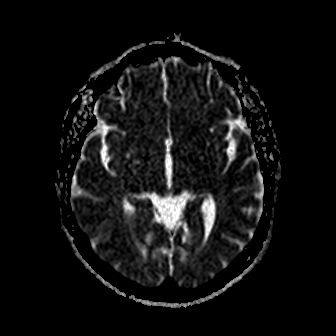
[im 37/56]
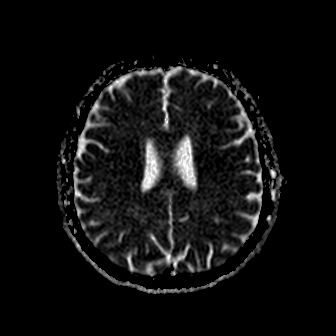
[im 46/56]
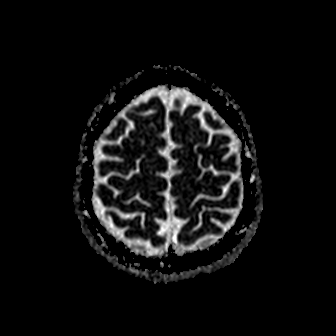
[im 56/56]
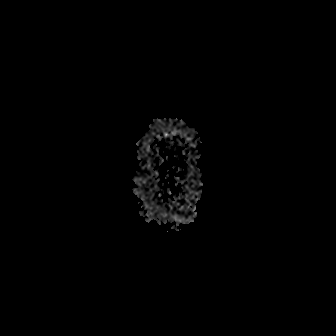

[Series 7: cor dwi_tracew · coronal · 5.0mm · 0.68mm/px · 4 of 36 slices shown]
[im 1/36]
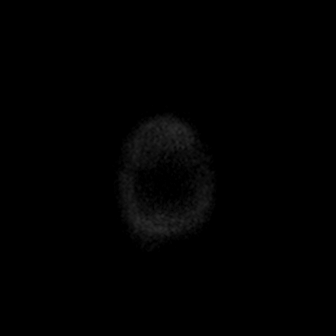
[im 12/36]
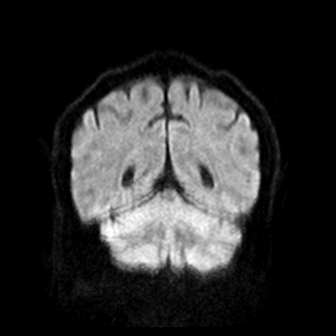
[im 24/36]
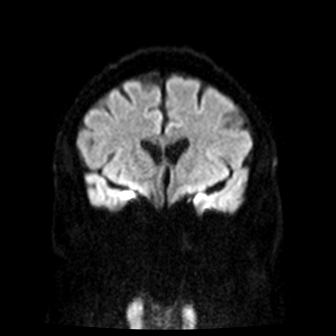
[im 36/36]
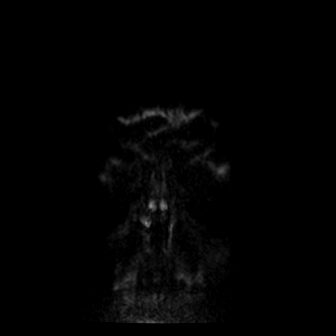

[Series 8: cor dwi_adc · coronal · 5.0mm · 0.68mm/px · 4 of 36 slices shown]
[im 1/36]
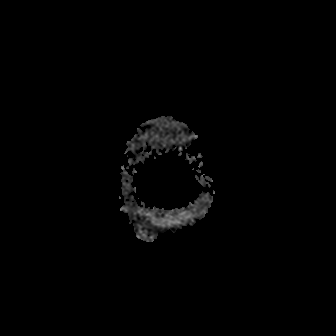
[im 12/36]
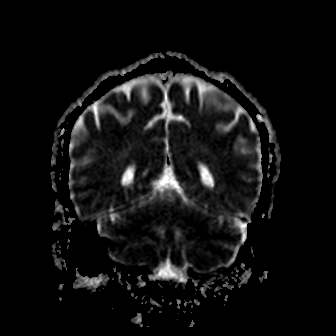
[im 24/36]
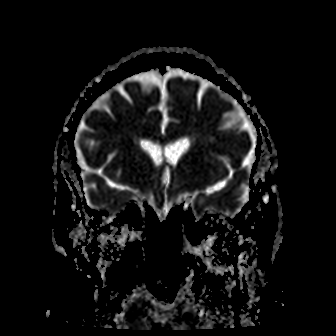
[im 36/36]
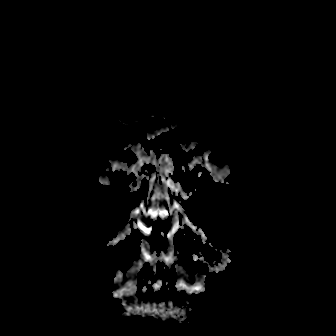

[Series 9: T1 · sagittal · 5.0mm · 0.94mm/px · 3 of 25 slices shown (1 of 2)]
[im 1/25]
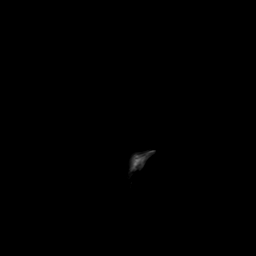
[im 13/25]
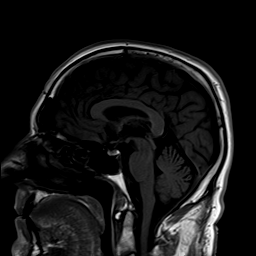
[im 25/25]
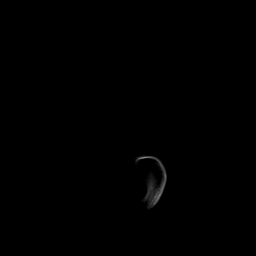

[Series 10: T2 · axial · 5.0mm · 0.45mm/px · z∈[-96,+62]mm · 3 of 28 slices shown (1 of 2)]
[im 1/28]
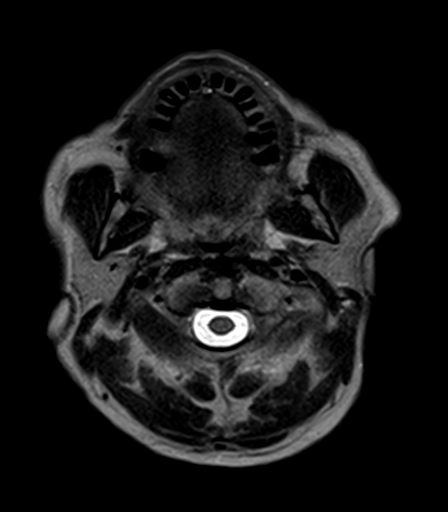
[im 14/28]
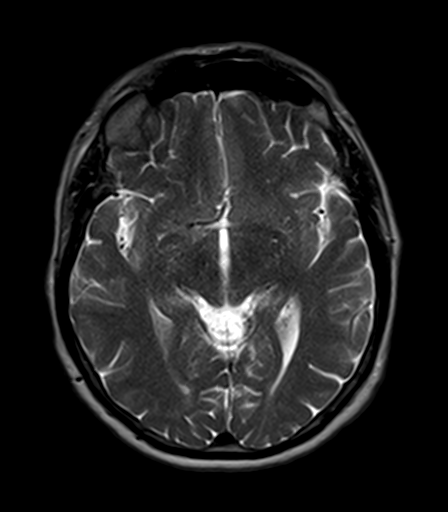
[im 28/28]
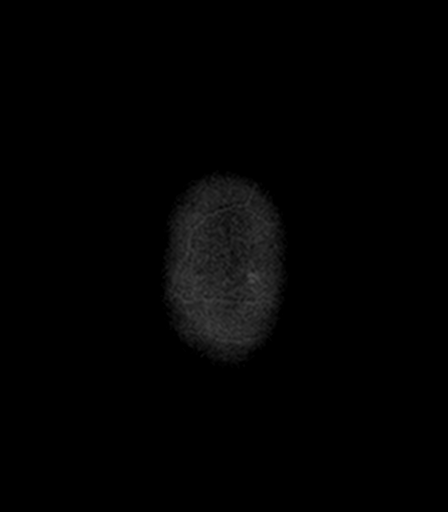

[Series 12: T1 · axial · 5.0mm · 0.90mm/px · z∈[-96,+62]mm · 3 of 28 slices shown (2 of 2)]
[im 1/28]
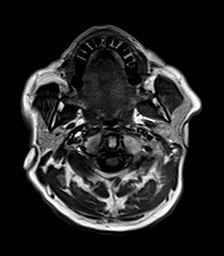
[im 14/28]
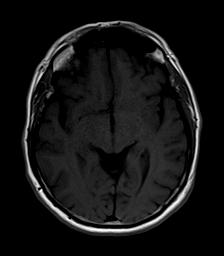
[im 28/28]
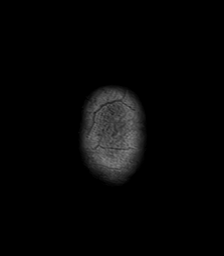

[Series 13: FLAIR · axial · 5.0mm · 1.20mm/px · z∈[-97,+62]mm · 3 of 28 slices shown]
[im 1/28]
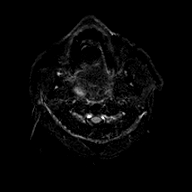
[im 14/28]
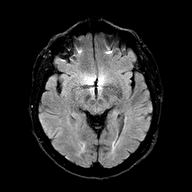
[im 28/28]
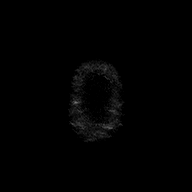

[Series 14: T2 · coronal · 5.0mm · 0.45mm/px · 4 of 31 slices shown (2 of 2)]
[im 1/31]
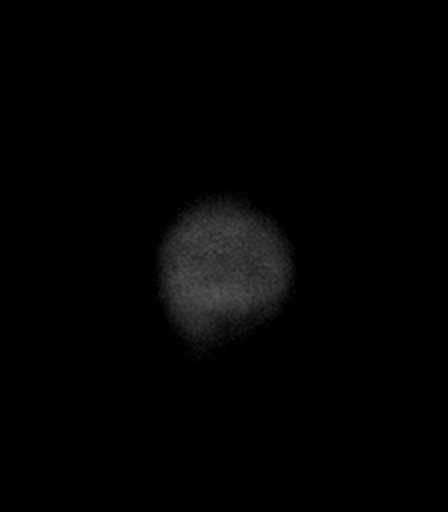
[im 11/31]
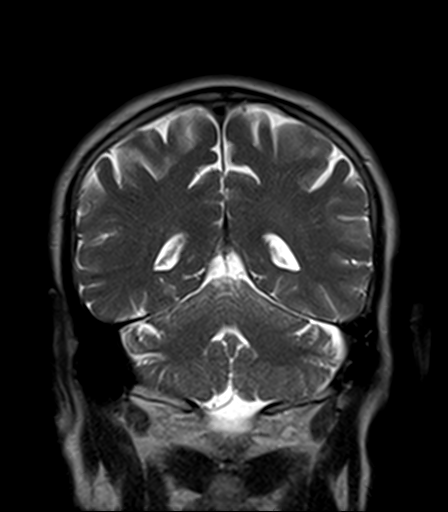
[im 21/31]
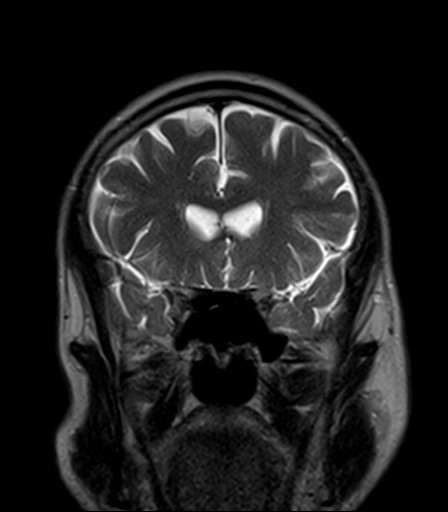
[im 31/31]
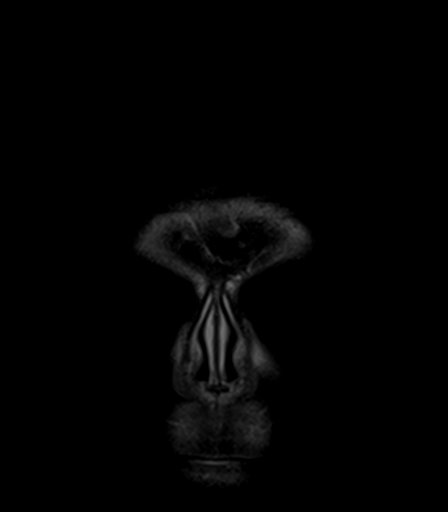

[Series 15: T1 post-contrast · axial · 5.0mm · 0.90mm/px · z∈[-96,+62]mm · 3 of 28 slices shown (1 of 2)]
[im 1/28]
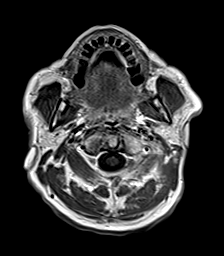
[im 14/28]
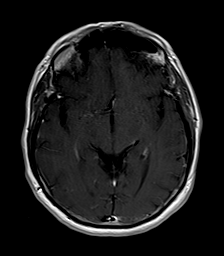
[im 28/28]
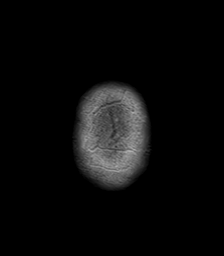

[Series 16: T1 post-contrast · coronal · 5.0mm · 0.90mm/px · 4 of 31 slices shown (2 of 2)]
[im 1/31]
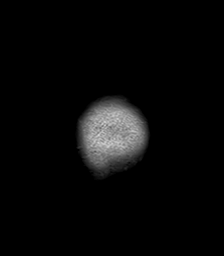
[im 11/31]
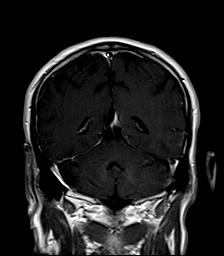
[im 21/31]
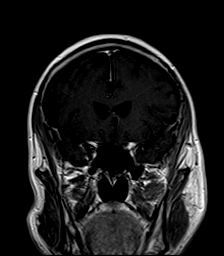
[im 31/31]
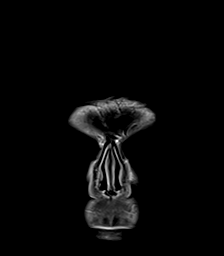

[45 of 48 positions shown; findings below may reference images not displayed]

FINDINGS: Brain: No acute infarct, hemorrhage, or mass lesion is present. No
significant white matter lesions are present. The ventricles are of
normal size. Minimal periventricular and subcortical T2
hyperintensities are likely within normal limits for age. No
associated enhancement is present.

The internal auditory canals are within normal limits. The
ventricles are of normal size.

Postcontrast images demonstrate no pathologic enhancement.

Vascular: Flow is present in the major intracranial arteries.

Skull and upper cervical spine: The craniocervical junction is
normal. Upper cervical spine is within normal limits. Marrow signal
is unremarkable.

Sinuses/Orbits: The paranasal sinuses and mastoid air cells are
clear. The globes and orbits are within normal limits.
IMPRESSION: Normal MRI of the brain for age. No acute or focal lesion to explain
the patient's symptoms.

## 2019-08-09 MED ORDER — GADOBUTROL 1 MMOL/ML IV SOLN
8.0000 mL | Freq: Once | INTRAVENOUS | Status: AC | PRN
  Administered 2019-08-09: 17:00:00 8 mL via INTRAVENOUS

## 2019-10-24 ENCOUNTER — Other Ambulatory Visit: Payer: Self-pay

## 2019-10-24 ENCOUNTER — Ambulatory Visit
Admission: EM | Admit: 2019-10-24 | Discharge: 2019-10-24 | Disposition: A | Discharge status: Home / Self Care | Payer: Medicare Other | Attending: Family Medicine | Admitting: Family Medicine

## 2019-10-24 ENCOUNTER — Ambulatory Visit (INDEPENDENT_AMBULATORY_CARE_PROVIDER_SITE_OTHER): Payer: Medicare Other

## 2019-10-24 DIAGNOSIS — S6710XA Crushing injury of unspecified finger(s), initial encounter: Secondary | ICD-10-CM | POA: Diagnosis not present

## 2019-10-24 DIAGNOSIS — S61217A Laceration without foreign body of left little finger without damage to nail, initial encounter: Secondary | ICD-10-CM

## 2019-10-24 IMAGING — CR DG FINGER LITTLE 2+V*L*
3 series · 3 of 3 positions shown · non-contrast
Comparison: None

CLINICAL DATA: Crush injury and laceration LEFT little finger,
ANIULA car door shut on his finger while he was working on
today

EXAM:
LEFT LITTLE FINGER 2+V

[finger ap]
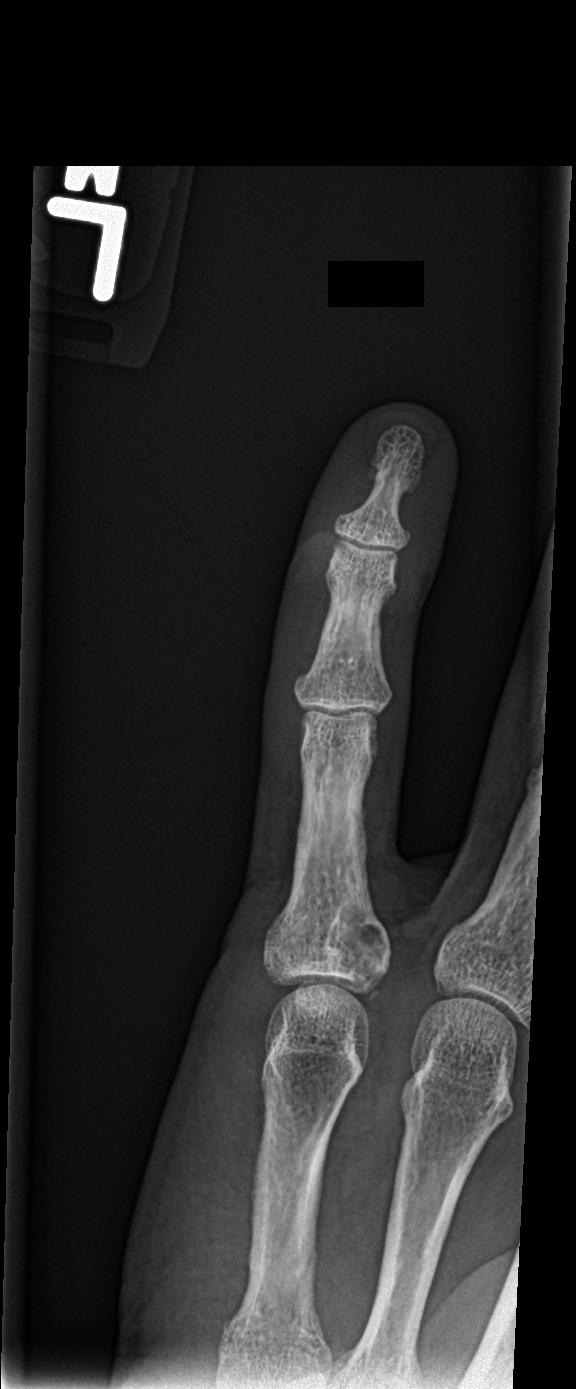

[finger obl]
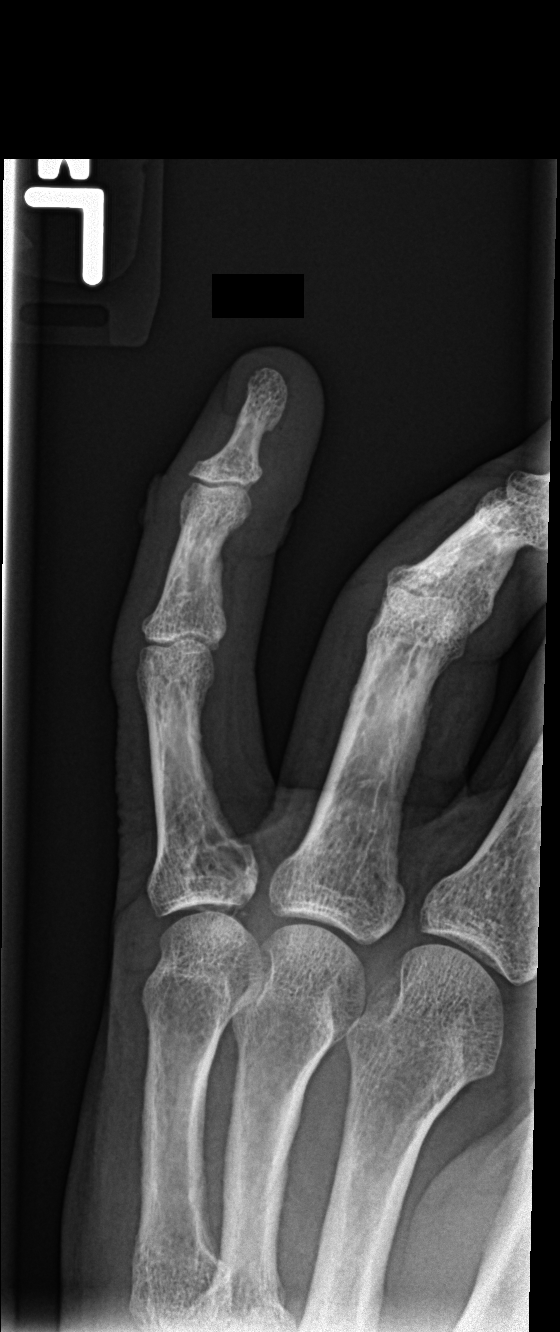

[finger lat]
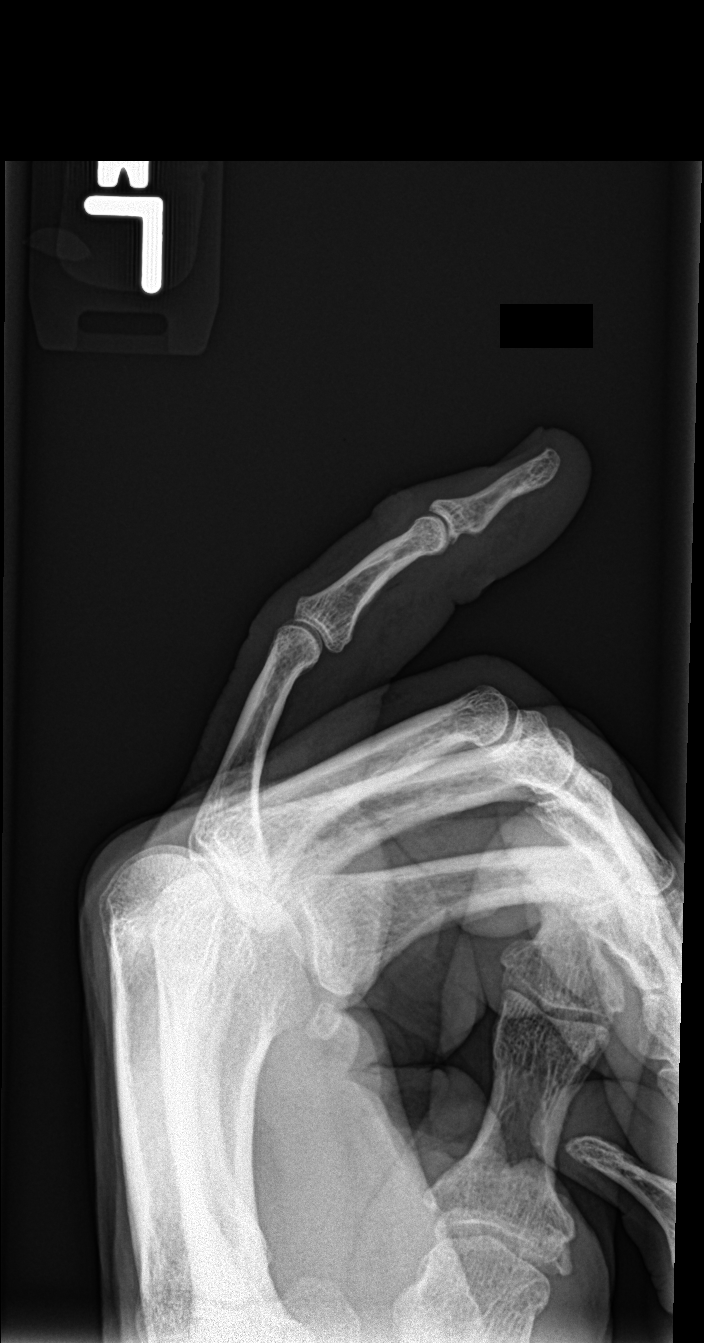

[3 of 3 positions shown; findings below may reference images not displayed]

FINDINGS: Osseous demineralization.

Bubbly well-circumscribed lytic focus at the base of the proximal
phalanx question small enchondroma.

Joint spaces preserved.

No acute fracture, dislocation, or bone destruction.
IMPRESSION: No acute osseous abnormalities.

Question small enchondroma at base of proximal phalanx LEFT little
finger.

## 2019-10-24 MED ORDER — MUPIROCIN 2 % EX OINT: TOPICAL_OINTMENT | CUTANEOUS | 0 refills | Status: DC

## 2019-10-24 MED ORDER — BUPIVACAINE HCL 0.25 % IJ SOLN
5.0000 mL | Freq: Once | INTRAMUSCULAR | Status: AC
  Administered 2019-10-24: 5 mL

## 2019-10-24 MED ORDER — CEPHALEXIN 500 MG PO CAPS: 500.0000 mg | ORAL_CAPSULE | Freq: Three times a day (TID) | ORAL | 0 refills | Status: AC

## 2019-10-24 MED ORDER — TETANUS-DIPHTH-ACELL PERTUSSIS 5-2.5-18.5 LF-MCG/0.5 IM SUSP
0.5000 mL | Freq: Once | INTRAMUSCULAR | Status: AC
  Administered 2019-10-24: 0.5 mL via INTRAMUSCULAR

## 2019-10-24 MED ORDER — LIDOCAINE HCL (PF) 1 % IJ SOLN
5.0000 mL | Freq: Once | INTRAMUSCULAR | Status: AC
  Administered 2019-10-24: 5 mL

## 2019-10-24 NOTE — ED Provider Notes (Signed)
[facility] ____________________________________________  Time seen: Approximately 3:48 PM  I have reviewed the triage vital signs and the nursing notes.   HISTORY  Chief Complaint Finger Injury (10/24/19)   HPI Loraine Ferriss is a 69 y.o. male presenting for evaluation of laceration to left pinky finger that occurred approximately 2 hours prior to arrival.  Patient reports he was working on his truck and the door shot on the pinky finger causing pain and injury.  Has been holding pressure.  Pain currently moderate.  Denies other alleviating measures.  Pain worse with movement.  Denies paresthesias.  Right-hand-dominant.  Unsure of last tetanus immunization.  Denies recent sickness or fever.  Reports otherwise doing well.   Past Medical History:  Diagnosis Date  . Prostate cancer ([place])   . Thyroid disease     There are no problems to display for this patient.   Past Surgical History:  Procedure Laterality Date  . PROSTATE CRYOABLATION  2010     No current facility-administered medications for this encounter.  Current Outpatient Medications:  .  levothyroxine (SYNTHROID) 112 MCG tablet, Take 112 mcg by mouth daily., Disp: , Rfl:  .  cephALEXin (KEFLEX) 500 MG capsule, Take 1 capsule (500 mg total) by mouth 3 (three) times daily for 7 days., Disp: 21 capsule, Rfl: 0 .  mupirocin ointment (BACTROBAN) 2 %, Apply two times a day for 7 days., Disp: 22 g, Rfl: 0 .  ondansetron (ZOFRAN) 4 MG tablet, Take 1 tablet (4 mg total) by mouth every 8 (eight) hours as needed for nausea or vomiting., Disp: 20 tablet, Rfl: 0  Allergies Patient has no known allergies.  Family History  Problem Relation Age of Onset  . Prostate cancer Neg Hx   . Bladder Cancer Neg Hx   . Kidney cancer Neg Hx     Social History Social History   Tobacco Use  . Smoking status: Never Smoker  . Smokeless tobacco: Never Used  Substance Use Topics  . Alcohol use: Yes  . Drug use: No     Review of Systems Constitutional: No fever Cardiovascular: Denies chest pain. Respiratory: Denies shortness of breath. Musculoskeletal: Positive left pinky pain. Skin: Positive laceration  ____________________________________________   PHYSICAL EXAM:  VITAL SIGNS: ED Triage Vitals  Enc Vitals Group     BP 10/24/19 1439 (!) 148/97     Pulse Rate 10/24/19 1439 85     Resp 10/24/19 1439 18     Temp 10/24/19 1439 97.8 F (36.6 C)     Temp Source 10/24/19 1439 Oral     SpO2 10/24/19 1439 100 %     Weight 10/24/19 1437 176 lb (79.8 kg)     Height 10/24/19 1437 6' 1.5" (1.867 m)     Head Circumference --      Peak Flow --      Pain Score 10/24/19 1436 9     Pain Loc --      Pain Edu? --      Excl. in [place]? --     Constitutional: Alert and oriented. Well appearing and in no acute distress. Eyes: Conjunctivae are normal.  ENT      Head: Normocephalic and atraumatic. Cardiovascular:Good peripheral circulation. Respiratory: Normal respiratory effort without tachypnea nor retractions. Musculoskeletal: Except: Left hand fifth digit middle phalanx to distal phalanx moderate tenderness to direct palpation with mild swelling, dorsal aspect along DIP joint 1.5 cm superficial laceration, palmar aspect of DIP joint 1.5 cm laceration with minimal dehiscence and  active bleeding, no foreign body noted, painful and mildly limited distal resisted flexion and extension of fifth digit.  Left hand otherwise nontender Neurologic:  Normal speech and language.  Skin:  Skin is warm, dry.  As above Psychiatric: Mood and affect are normal. Speech and behavior are normal. Patient exhibits appropriate insight and judgment   ___________________________________________   LABS (all labs ordered are listed, but only abnormal results are displayed)  Labs Reviewed - No data to display  RADIOLOGY  DG Finger Little Left  Result Date: 10/24/2019 CLINICAL DATA:  Crush injury and laceration LEFT little  finger, [place] blue car door shut on his finger while he was working on today EXAM: LEFT LITTLE FINGER 2+V COMPARISON:  None FINDINGS: Osseous demineralization. Bubbly well-circumscribed lytic focus at the base of the proximal phalanx question small enchondroma. Joint spaces preserved. No acute fracture, dislocation, or bone destruction. IMPRESSION: No acute osseous abnormalities. 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 Electronically Signed   By: Lavonia Dana M.D.   On: 10/24/2019 15:30   ____________________________________________   PROCEDURES Procedures  Procedure(s) performed:  Procedure explained and verbal consent obtained. Consent: Verbal consent obtained. Written consent not obtained. Risks and benefits: risks, benefits and alternatives were discussed Patient identity confirmed: verbally with patient and hospital-assigned identification number  Consent given by: patient   Laceration Repair Location: Left fifth digit Length: 1.5 cm Foreign bodies: no foreign bodies Tendon involvement: none Nerve involvement: none Preparation: Patient was prepped and draped in the usual sterile fashion. Digital block 0.25% bupivacaine 2 mL Anesthesia with 1% Lidocaine 3 mls Cleaned with Betadine Irrigation solution: saline Irrigation method: jet lavage Amount of cleaning: copious Repaired with 5-0 nylon Number of sutures: 5 sutures to palmar aspect and one suture to dorsal aspect Technique: simple interrupted  Approximation: loose Patient tolerate well. Wound well approximated post repair.  Antibiotic ointment and dressing applied.  Wound care instructions provided.  Observe for any signs of infection or other problems.      INITIAL IMPRESSION / ASSESSMENT AND PLAN / ED COURSE  Pertinent labs & imaging results that were available during my care of the patient were reviewed by me and considered in my medical decision making (see chart for details).   Well-appearing patient.  No acute distress.  Crush injury to left fifth digit.  Wound copiously cleaned, irrigated and repaired as above.  Left fifth digit x-ray as above radiologist, no acute osseous abnormalities, question small and enchondroma.  Counseled regarding x-ray findings with patient and x-ray copy given to patient-follow-up with primary care.  Patient does have mildly limited distal flexion and extension but overall range of motion good, recommend for patient have follow-up with orthopedic to ensure no tendon injury.  Prophylactic Keflex and topical Bactroban.  Tetanus immunization updated.  Ice elevation and wound care.  Finger splint applied and buddy taping for 3 days.  Discussed her follow-up and return parameters.  Return in 10 days for suture removal.Discussed indication, risks and benefits of medications with patient.   Discussed follow up with Primary care physician this week. Discussed follow up and return parameters including no resolution or any worsening concerns. Patient verbalized understanding and agreed to plan.   ____________________________________________   FINAL CLINICAL IMPRESSION(S) / ED DIAGNOSES  Final diagnoses:  Laceration of left little finger without foreign body without damage to nail, initial encounter  Crushing injury of finger, initial encounter     ED Discharge Orders  Ordered    cephALEXin (KEFLEX) 500 MG capsule  3 times daily     10/24/19 1613    mupirocin ointment (BACTROBAN) 2 %     10/24/19 1613           Note: This dictation was prepared with Dragon dictation along with smaller phrase technology. Any transcriptional errors that result from this process are unintentional.         Marylene Land, NP 10/24/19 1659

## 2019-10-24 NOTE — Discharge Instructions (Addendum)
Take medication as prescribed. Rest. Keep clean. Monitor.  Use finger splint for the next 3 to 4 days.  Ice.  Follow-up with orthopedic, see above, as discussed.  Suture removal in 10 days.  Return to urgent care or follow-up with primary care for suture removal.  Follow up with your primary care physician this week as needed. Return to Urgent care for new or worsening concerns.

## 2019-10-24 NOTE — ED Triage Notes (Signed)
Pt presents with c/o laceration and crushing injury to the left pinky finger that happened today. Pt states he was working on his car door and the wind blew it shut on his finger. Pt has small laceration to top and bottom of distal knuckle, along with decreased ROM. Pt does not recall his last Tetanus shot.

## 2020-01-10 ENCOUNTER — Encounter: Payer: Self-pay | Admitting: Emergency Medicine

## 2020-01-10 ENCOUNTER — Other Ambulatory Visit: Payer: Self-pay

## 2020-01-10 ENCOUNTER — Ambulatory Visit
Admission: EM | Admit: 2020-01-10 | Discharge: 2020-01-10 | Disposition: A | Discharge status: Home / Self Care | Payer: Medicare Other | Attending: Family Medicine | Admitting: Family Medicine

## 2020-01-10 DIAGNOSIS — T148XXA Other injury of unspecified body region, initial encounter: Secondary | ICD-10-CM

## 2020-01-10 DIAGNOSIS — L089 Local infection of the skin and subcutaneous tissue, unspecified: Secondary | ICD-10-CM | POA: Diagnosis not present

## 2020-01-10 MED ORDER — LEVOFLOXACIN 750 MG PO TABS: 750.0000 mg | ORAL_TABLET | Freq: Every day | ORAL | 0 refills | Status: DC

## 2020-01-10 MED ORDER — MUPIROCIN 2 % EX OINT: 1.0000 "application " | TOPICAL_OINTMENT | Freq: Two times a day (BID) | CUTANEOUS | 0 refills | Status: AC

## 2020-01-10 NOTE — ED Provider Notes (Signed)
[facility]    CSN: [id number] Arrival date & time: 01/10/20  1225  History   Chief Complaint Chief Complaint  Patient presents with  . Puncture Wound    right foot   HPI   69 year old male presents with the above complaint.  Patient states that he stepped on a nail yesterday.  He had no issues.  Punctured shoe and then subsequently punctured his right foot just below the MTP joint of the fifth toe.  Tetanus up-to-date.  Area is painful.  No current drainage.  Mild erythema.  No fever.  No other associated symptoms.  No other complaints or concerns at this time.  Past Medical History:  Diagnosis Date  . Prostate cancer ([place])   . Thyroid disease    Past Surgical History:  Procedure Laterality Date  . PROSTATE CRYOABLATION  2010   Home Medications    Prior to Admission medications   Medication Sig Start Date End Date Taking? Authorizing Provider  levothyroxine (SYNTHROID) 112 MCG tablet Take 112 mcg by mouth daily. 01/30/19  Yes [provider]  levofloxacin (LEVAQUIN) 750 MG tablet Take 1 tablet (750 mg total) by mouth daily. 01/10/20   Coral Spikes, DO  mupirocin ointment (BACTROBAN) 2 % Apply 1 application topically 2 (two) times daily for 7 days. 01/10/20 01/17/20  Coral Spikes, DO    Family History Family History  Problem Relation Age of Onset  . Prostate cancer Neg Hx   . Bladder Cancer Neg Hx   . Kidney cancer Neg Hx     Social History Social History   Tobacco Use  . Smoking status: Never Smoker  . Smokeless tobacco: Never Used  Vaping Use  . Vaping Use: Never used  Substance Use Topics  . Alcohol use: Yes  . Drug use: No     Allergies   Patient has no known allergies.   Review of Systems Review of Systems  Constitutional: Negative for fever.  Skin: Positive for wound.   Physical Exam Triage Vital Signs ED Triage Vitals  Enc Vitals Group     BP 01/10/20 1239 120/82     Pulse Rate 01/10/20 1239 76     Resp 01/10/20 1239  18     Temp 01/10/20 1239 98.1 F (36.7 C)     Temp Source 01/10/20 1239 Oral     SpO2 01/10/20 1239 98 %     Weight 01/10/20 1236 175 lb 14.8 oz (79.8 kg)     Height 01/10/20 1236 6' 0.5" (1.842 m)     Head Circumference --      Peak Flow --      Pain Score 01/10/20 1236 7     Pain Loc --      Pain Edu? --      Excl. in [place]? --    Updated Vital Signs BP 120/82 (BP Location: Left Arm)   Pulse 76   Temp 98.1 F (36.7 C) (Oral)   Resp 18   Ht 6' 0.5" (1.842 m)   Wt 79.8 kg   SpO2 98%   BMI 23.53 kg/m   Visual Acuity Right Eye Distance:   Left Eye Distance:   Bilateral Distance:    Right Eye Near:   Left Eye Near:    Bilateral Near:     Physical Exam Vitals and nursing note reviewed.  Constitutional:      General: He is not in acute distress.    Appearance: Normal appearance. He is not ill-appearing.  HENT:     Head: Normocephalic and atraumatic.  Eyes:     General:        Right eye: No discharge.        Left eye: No discharge.     Conjunctiva/sclera: Conjunctivae normal.  Pulmonary:     Effort: Pulmonary effort is normal. No respiratory distress.  Musculoskeletal:       Feet:  Feet:     Comments: Small puncture wound noted at the labeled location.  Tender to palpation.  Mild surrounding erythema and warmth. Neurological:     Mental Status: He is alert.  Psychiatric:        Mood and Affect: Mood normal.        Behavior: Behavior normal.    UC Treatments / Results  Labs (all labs ordered are listed, but only abnormal results are displayed) Labs Reviewed - No data to display  EKG   Radiology No results found.  Procedures Procedures (including critical care time)  Medications Ordered in UC Medications - No data to display  Initial Impression / Assessment and Plan / UC Course  I have reviewed the triage vital signs and the nursing notes.  Pertinent labs & imaging results that were available during my care of the patient were reviewed by me and  considered in my medical decision making (see chart for details).    69 year old male presents with an infected plantar puncture wound.  Treating with Levaquin and Bactroban ointment.   Final Clinical Impressions(s) / UC Diagnoses   Final diagnoses:  Infected puncture wound     Discharge Instructions     Medication as prescribed.  If you worsen, go to the ER.  Take care  Dr. Lacinda Axon    ED Prescriptions    Medication Sig Dispense Auth. Provider   mupirocin ointment (BACTROBAN) 2 % Apply 1 application topically 2 (two) times daily for 7 days. 22 g Leyanna Bittman G, DO   levofloxacin (LEVAQUIN) 750 MG tablet Take 1 tablet (750 mg total) by mouth daily. 7 tablet Coral Spikes, DO     PDMP not reviewed this encounter.   Coral Spikes, [place] 01/10/20 1328

## 2020-01-10 NOTE — ED Triage Notes (Signed)
Pt states he stepped on a nail yesterday with his right foot. His last tetanus vaccine was 10/24/19.

## 2020-01-10 NOTE — Discharge Instructions (Signed)
Medication as prescribed. ° °If you worsen, go to the ER. ° °Take care ° °Dr. Dashay Giesler  °

## 2020-02-20 ENCOUNTER — Other Ambulatory Visit: Payer: Self-pay | Admitting: Neurology

## 2020-02-20 ENCOUNTER — Other Ambulatory Visit (HOSPITAL_COMMUNITY): Payer: Self-pay | Admitting: Neurology

## 2020-02-20 DIAGNOSIS — R4189 Other symptoms and signs involving cognitive functions and awareness: Secondary | ICD-10-CM

## 2020-03-08 ENCOUNTER — Other Ambulatory Visit: Payer: Self-pay

## 2020-03-08 ENCOUNTER — Ambulatory Visit
Admission: RE | Admit: 2020-03-08 | Discharge: 2020-03-08 | Disposition: A | Discharge status: Home / Self Care | Payer: Medicare Other | Source: Ambulatory Visit | Attending: Neurology | Admitting: Neurology

## 2020-03-08 DIAGNOSIS — R4189 Other symptoms and signs involving cognitive functions and awareness: Secondary | ICD-10-CM | POA: Insufficient documentation

## 2020-03-08 IMAGING — MR MR HEAD WO/W CM
15 series · 48 of 48 positions shown · IV contrast (gadavist)
Comparison: [DATE] MRI head.  [DATE] head CT.

CLINICAL DATA: Cognitive impairment.

EXAM:
MRI HEAD WITHOUT AND WITH CONTRAST
TECHNIQUE: Multiplanar, multiecho pulse sequences of the brain and surrounding
structures were obtained without and with intravenous contrast.
CONTRAST:  7.5mL GADAVIST GADOBUTROL 1 MMOL/ML IV SOLN

[Series 5: ax dwi_tracew · axial · 3.0mm · 0.71mm/px · z∈[-154,+9]mm · 5 of 112 slices shown]
[im 1/112]
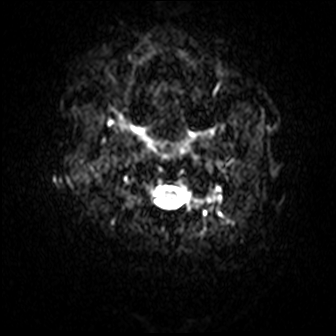
[im 28/112]
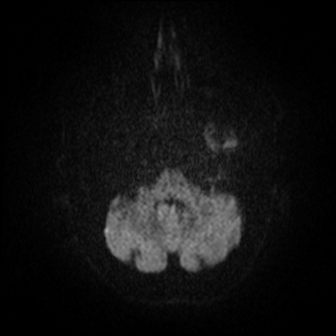
[im 56/112]
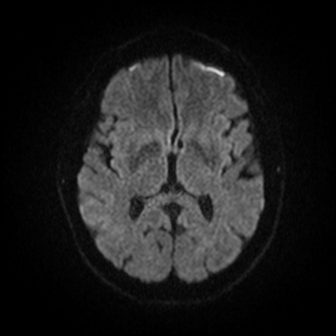
[im 84/112]
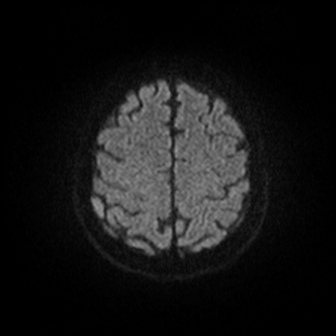
[im 112/112]
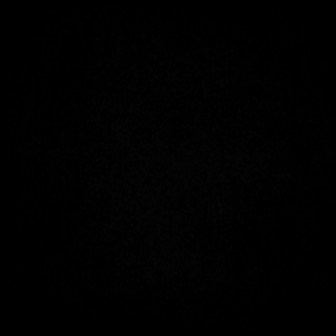

[Series 6: ax dwi_adc · axial · 3.0mm · 0.71mm/px · z∈[-154,+6]mm · 3 of 55 slices shown]
[im 1/55]
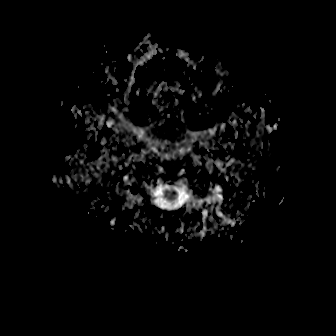
[im 28/55]
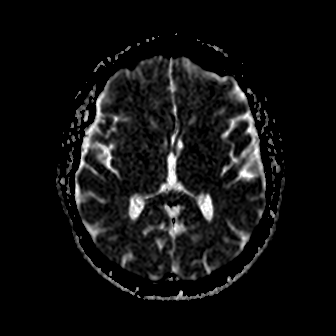
[im 55/55]
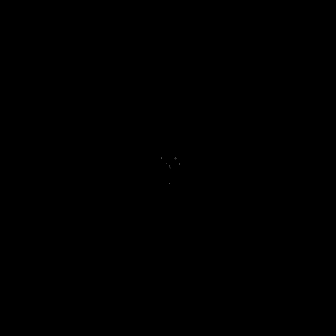

[Series 7: cor dwi_tracew · coronal · 5.0mm · 0.68mm/px · 4 of 80 slices shown]
[im 1/80]
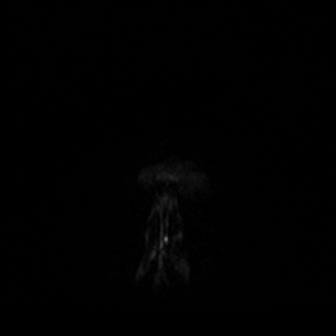
[im 27/80]
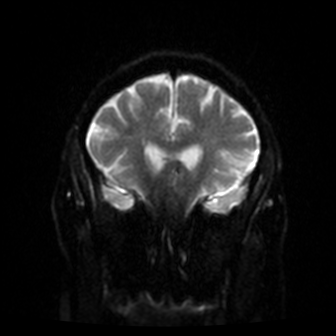
[im 53/80]
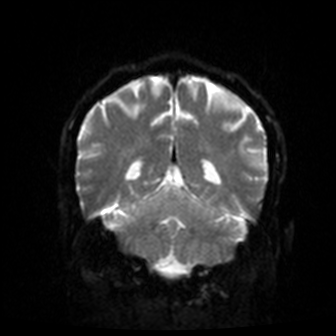
[im 80/80]
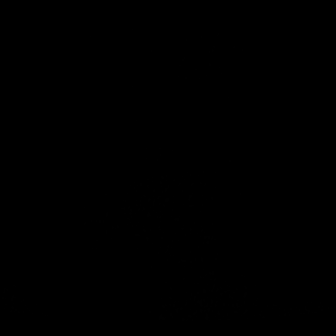

[Series 8: cor dwi_adc · coronal · 5.0mm · 0.68mm/px · 2 of 39 slices shown]
[im 1/39]
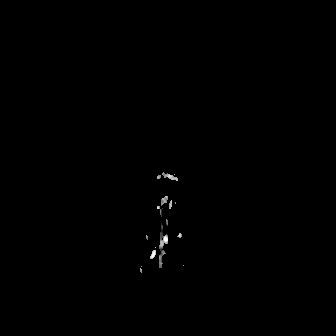
[im 39/39]
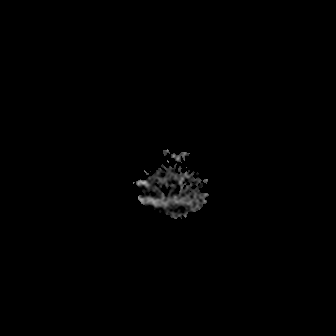

[Series 9: T1 · sagittal · 5.0mm · 0.94mm/px · 1 of 25 slices shown (1 of 2)]
[im 1/25]
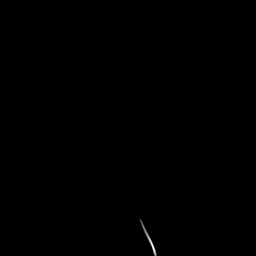

[Series 10: T2 · axial · 5.0mm · 0.53mm/px · 1 of 25 slices shown]
[im 1/25]
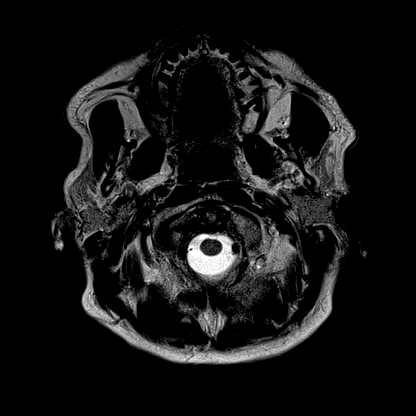

[Series 11: mag_images · axial · 3.0mm · 0.90mm/px · z∈[-159,+16]mm · 3 of 60 slices shown]
[im 1/60]
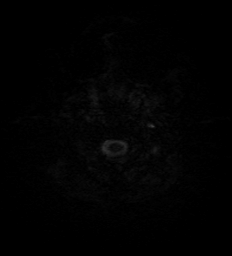
[im 30/60]
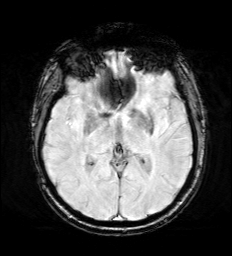
[im 60/60]
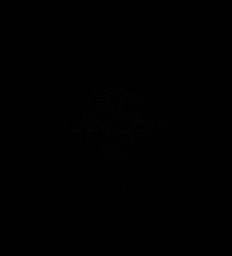

[Series 12: pha_images · axial · 3.0mm · 0.90mm/px · z∈[-159,+16]mm · 3 of 58 slices shown]
[im 1/58]
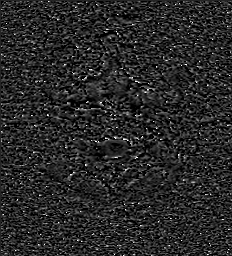
[im 29/58]
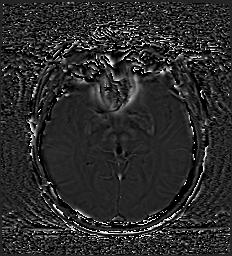
[im 58/58]
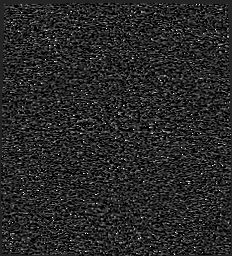

[Series 13: swi_images · axial · 3.0mm · 0.90mm/px · z∈[-159,+16]mm · 3 of 60 slices shown]
[im 1/60]
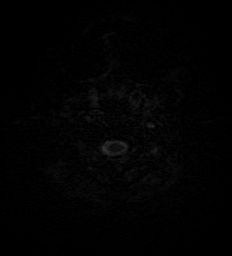
[im 30/60]
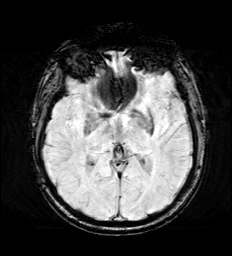
[im 60/60]
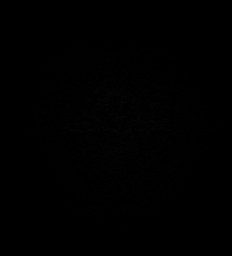

[Series 14: mip_images(sw) · axial · 24.0mm · 0.90mm/px · z∈[-149,+6]mm · 2 of 53 slices shown]
[im 1/53]
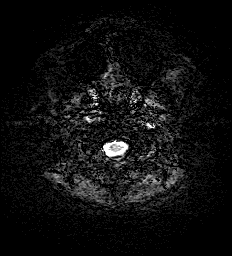
[im 53/53]
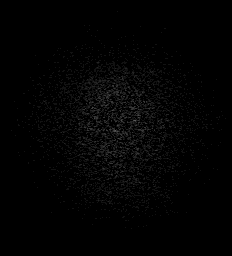

[Series 15: FLAIR · axial · 3.0mm · 0.53mm/px · z∈[-151,+9]mm · 3 of 55 slices shown]
[im 1/55]
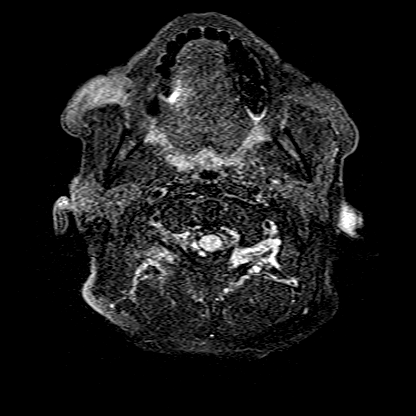
[im 28/55]
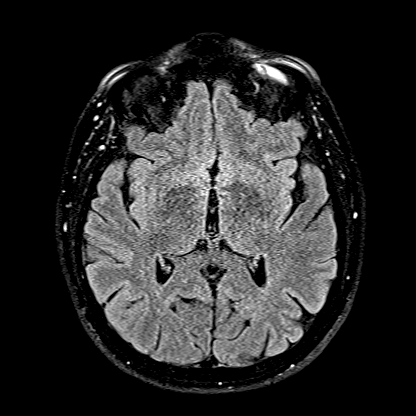
[im 55/55]
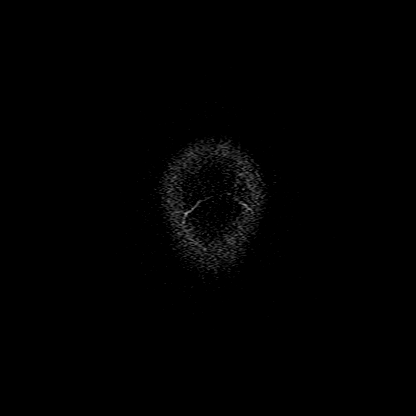

[Series 16: T1 · axial · 1.0mm · 0.98mm/px · z∈[-160,+13]mm · 8 of 176 slices shown (2 of 2)]
[im 1/176]
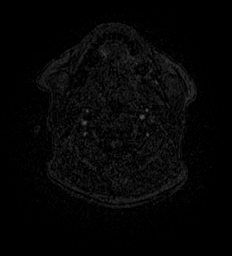
[im 26/176]
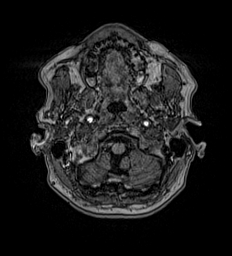
[im 51/176]
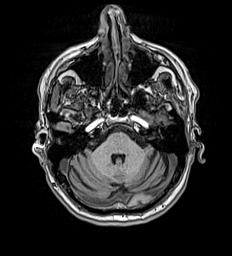
[im 76/176]
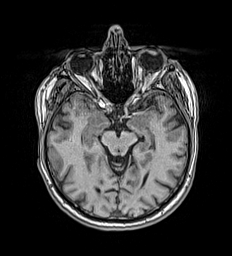
[im 101/176]
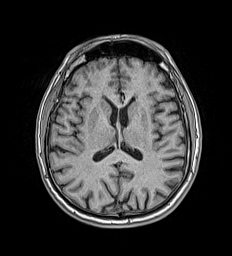
[im 126/176]
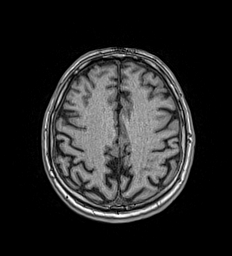
[im 151/176]
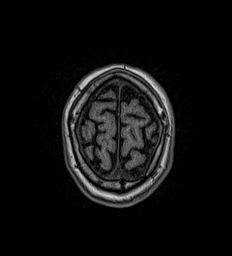
[im 176/176]
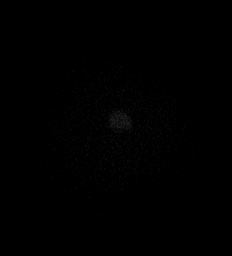

[Series 17: T2 post-contrast · coronal · 5.0mm · 0.57mm/px · 1 of 29 slices shown]
[im 1/29]
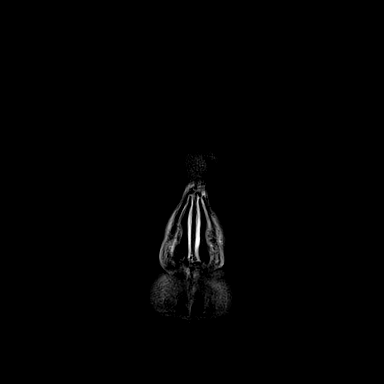

[Series 18: T1 post-contrast · axial · 1.0mm · 0.98mm/px · z∈[-160,+13]mm · 8 of 176 slices shown (1 of 2)]
[im 1/176]
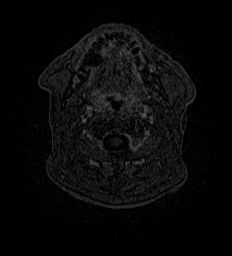
[im 26/176]
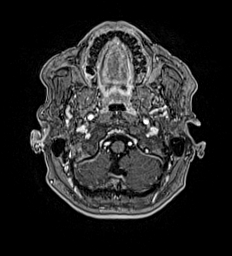
[im 51/176]
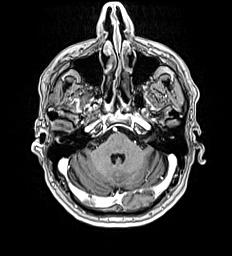
[im 76/176]
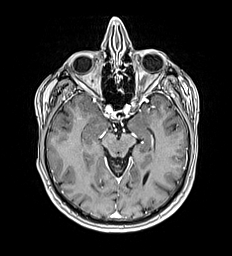
[im 101/176]
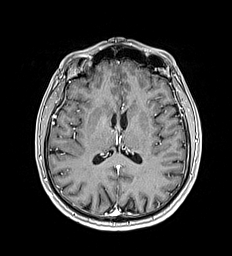
[im 126/176]
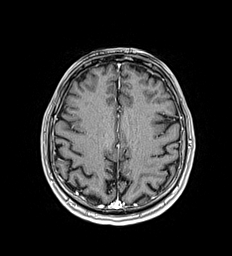
[im 151/176]
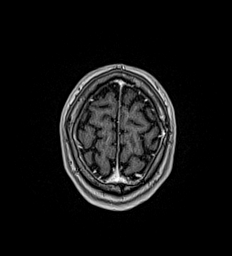
[im 176/176]
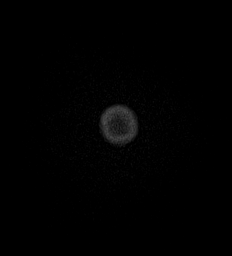

[Series 19: T1 post-contrast · coronal · 5.0mm · 0.57mm/px · 1 of 29 slices shown (2 of 2)]
[im 1/29]
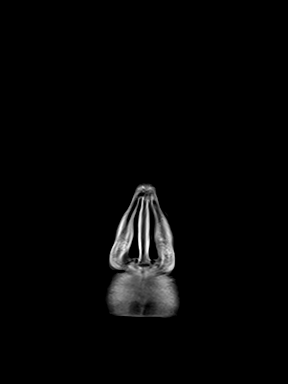

[48 of 48 positions shown; findings below may reference images not displayed]

FINDINGS: Brain: No diffusion-weighted signal abnormality. No intracranial
hemorrhage. Cerebral volume is within normal limits. No significant
white matter disease. Bilateral hippocampi are symmetric
demonstrating normal morphology and signal intensity.

No midline shift, ventriculomegaly or extra-axial fluid collection.
No mass lesion. No abnormal enhancement.

Vascular: Normal flow voids.

Skull and upper cervical spine: Normal marrow signal.

Sinuses/Orbits: Normal orbits. Clear paranasal sinuses. No mastoid
effusion.

Other: None.
IMPRESSION: Normal MRI of the brain.

## 2020-03-08 MED ORDER — GADOBUTROL 1 MMOL/ML IV SOLN
7.5000 mL | Freq: Once | INTRAVENOUS | Status: AC | PRN
  Administered 2020-03-08: 7.5 mL via INTRAVENOUS

## 2020-06-14 DIAGNOSIS — R2689 Other abnormalities of gait and mobility: Secondary | ICD-10-CM | POA: Insufficient documentation

## 2020-06-19 DIAGNOSIS — F0390 Unspecified dementia without behavioral disturbance: Secondary | ICD-10-CM | POA: Insufficient documentation

## 2020-12-26 ENCOUNTER — Other Ambulatory Visit: Payer: Self-pay

## 2020-12-26 ENCOUNTER — Ambulatory Visit
Admission: EM | Admit: 2020-12-26 | Discharge: 2020-12-26 | Disposition: A | Discharge status: Home / Self Care | Payer: Medicare Other | Attending: Family Medicine | Admitting: Family Medicine

## 2020-12-26 ENCOUNTER — Encounter: Payer: Self-pay | Admitting: Emergency Medicine

## 2020-12-26 DIAGNOSIS — E871 Hypo-osmolality and hyponatremia: Secondary | ICD-10-CM | POA: Insufficient documentation

## 2020-12-26 DIAGNOSIS — E875 Hyperkalemia: Secondary | ICD-10-CM | POA: Diagnosis present

## 2020-12-26 DIAGNOSIS — R739 Hyperglycemia, unspecified: Secondary | ICD-10-CM | POA: Insufficient documentation

## 2020-12-26 LAB — CBC WITH DIFFERENTIAL/PLATELET
Abs Immature Granulocytes: 0.05 10*3/uL (ref 0.00–0.07)
Basophils Absolute: 0 10*3/uL (ref 0.0–0.1)
Basophils Relative: 1 %
Eosinophils Absolute: 0.1 10*3/uL (ref 0.0–0.5)
Eosinophils Relative: 2 %
HCT: 42.9 % (ref 39.0–52.0)
Hemoglobin: 15.1 g/dL (ref 13.0–17.0)
Immature Granulocytes: 1 %
Lymphocytes Relative: 25 %
Lymphs Abs: 1.7 10*3/uL (ref 0.7–4.0)
MCH: 29.1 pg (ref 26.0–34.0)
MCHC: 35.2 g/dL (ref 30.0–36.0)
MCV: 82.7 fL (ref 80.0–100.0)
Monocytes Absolute: 0.6 10*3/uL (ref 0.1–1.0)
Monocytes Relative: 9 %
Neutro Abs: 4.3 10*3/uL (ref 1.7–7.7)
Neutrophils Relative %: 62 %
Platelets: 231 10*3/uL (ref 150–400)
RBC: 5.19 MIL/uL (ref 4.22–5.81)
RDW: 12 % (ref 11.5–15.5)
WBC: 6.8 10*3/uL (ref 4.0–10.5)
nRBC: 0 % (ref 0.0–0.2)

## 2020-12-26 LAB — COMPREHENSIVE METABOLIC PANEL
ALT: 25 U/L (ref 0–44)
AST: 17 U/L (ref 15–41)
Albumin: 4.4 g/dL (ref 3.5–5.0)
Alkaline Phosphatase: 136 U/L — ABNORMAL HIGH (ref 38–126)
Anion gap: 8 (ref 5–15)
BUN: 24 mg/dL — ABNORMAL HIGH (ref 8–23)
CO2: 28 mmol/L (ref 22–32)
Calcium: 9.2 mg/dL (ref 8.9–10.3)
Chloride: 88 mmol/L — ABNORMAL LOW (ref 98–111)
Creatinine, Ser: 1.7 mg/dL — ABNORMAL HIGH (ref 0.61–1.24)
GFR, Estimated: 43 mL/min — ABNORMAL LOW (ref >60)
Glucose, Bld: 808 mg/dL (ref 70–99)
Potassium: 5.5 mmol/L — ABNORMAL HIGH (ref 3.5–5.1)
Sodium: 124 mmol/L — ABNORMAL LOW (ref 135–145)
Total Bilirubin: 0.9 mg/dL (ref 0.3–1.2)
Total Protein: 8.2 g/dL — ABNORMAL HIGH (ref 6.5–8.1)

## 2020-12-26 LAB — HEMOGLOBIN A1C
Hgb A1c MFr Bld: 11.3 % — ABNORMAL HIGH (ref 4.8–5.6)
Mean Plasma Glucose: 277.61 mg/dL

## 2020-12-26 LAB — URINALYSIS, COMPLETE (UACMP) WITH MICROSCOPIC
Bilirubin Urine: NEGATIVE
Glucose, UA: >1000 mg/dL — AB
Hgb urine dipstick: NEGATIVE
Ketones, ur: NEGATIVE mg/dL
Leukocytes,Ua: NEGATIVE
Nitrite: NEGATIVE
Protein, ur: NEGATIVE mg/dL
Specific Gravity, Urine: <1.005 — ABNORMAL LOW (ref 1.005–1.030)
pH: 5 (ref 5.0–8.0)

## 2020-12-26 NOTE — ED Triage Notes (Signed)
Patient c/o increase in urination and urinary frequency that started a month ago.  Patient reports having to go to the bathroom every 2 hours.  Patient states that he had blood work done 3 weeks ago.  Patient denies any pain.

## 2020-12-26 NOTE — ED Provider Notes (Signed)
[facility]    CSN: [id number] Arrival date & time: 12/26/20  0857      History   Chief Complaint Chief Complaint  Patient presents with   Urinary Frequency    HPI Jared Hess is a 70 y.o. male.   HPI  70 year old male here for evaluation of urinary complaints.  Patient is here with his wife who is reporting increasing urinary frequency over the past month.  He states that he is voiding large volumes of urine every hour and a half.  This is despite limiting oral intake of fluids.  He has had associated symptoms of mouth dryness with increased thirst, has lost 8 pounds in last 2 months without trying, and fatigue.  He also has blurry vision with eye movement.  He states that when he turns his head or moves his eyes it takes him a second to focus on what he is looking.  He denies any pain with urination, blood in his urine, back pain, or fever.  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  He is currently being followed by neurology and has a primary care provider at [facility] clinic.  Patient states that he recently had blood work done 3 weeks ago at [facility] in [place] but we do not have those results available to [place].  When asked about diet and fluid intake [name] that his usual was to drink 2-3 bottles of water during the day and some nonalcoholic beers at night but now due to the mouth dryness he has to drink 3 bottles of water first thing in the morning when he gets up as his mouth and throat are so dry he can even speak.  Patient's wife is with him and reports that she knows he eats a good dinner but she is unsure of what he eats during the day.  Patient and spouse both state that he does not eat breakfast every  day.  Past Medical History:  Diagnosis Date   Prostate cancer [facility])    Thyroid disease     There are no problems to display for this patient.   Past Surgical History:  Procedure Laterality Date   PROSTATE CRYOABLATION  2010       Home Medications    Prior to Admission medications   Medication Sig Start Date End Date Taking? Authorizing Provider  levothyroxine (SYNTHROID) 112 MCG tablet Take 112 mcg by mouth daily. 01/30/19  Yes [provider]    Family History Family History  Problem Relation Age of Onset   Prostate cancer Neg Hx    Bladder Cancer Neg Hx    Kidney cancer Neg Hx     Social History Social History   Tobacco Use   Smoking status: Never   Smokeless tobacco: Never  Vaping Use   Vaping Use: Never used  Substance Use Topics   Alcohol use: Yes   Drug use: No     Allergies   Patient has no known allergies.   Review of Systems Review of Systems  Constitutional:  Positive for fatigue and unexpected weight change. Negative for activity change, appetite change and fever.  Endocrine: Positive for polydipsia and polyuria.  Hematological: Negative.   Psychiatric/Behavioral:  Negative.      Physical Exam Triage Vital Signs ED Triage Vitals  Enc Vitals Group     BP 12/26/20 0910 120/85     Pulse Rate 12/26/20 0910 72     Resp 12/26/20 0910 16     Temp 12/26/20 0910 97.7 F (36.5 C)     Temp Source 12/26/20 0910 Oral     SpO2 12/26/20 0910 97 %     Weight 12/26/20 0907 178 lb (80.7 kg)     Height 12/26/20 0907 6\' 1"  (1.854 m)     Head Circumference --      Peak Flow --      Pain Score 12/26/20 0907 0     Pain Loc --      Pain Edu? --      Excl. in [place]? --    No data found.  Updated Vital Signs BP 120/85 (BP Location: Left Arm)   Pulse 72   Temp 97.7 F (36.5 C) (Oral)   Resp 16   Ht 6\' 1"  (1.854 m)   Wt 178 lb (80.7 kg)   SpO2 97%   BMI 23.48 kg/m   Visual Acuity Right Eye Distance:   Left Eye Distance:   Bilateral  Distance:    Right Eye Near:   Left Eye Near:    Bilateral Near:     Physical Exam Vitals and nursing note reviewed.  Constitutional:      General: He is not in acute distress.    Appearance: Normal appearance. He is normal weight. He is not ill-appearing.  HENT:     Head: Normocephalic and atraumatic.  Cardiovascular:     Rate and Rhythm: Normal rate and regular rhythm.     Pulses: Normal pulses.     Heart sounds: Normal heart sounds. No murmur heard.   No gallop.  Pulmonary:     Effort: Pulmonary effort is normal.     Breath sounds: Normal breath sounds. No wheezing, rhonchi or rales.  Abdominal:     General: Abdomen is flat.     Palpations: Abdomen is soft.     Tenderness: There is no abdominal tenderness. There is no right CVA tenderness, left CVA tenderness or guarding.  Skin:    General: Skin is warm and dry.     Capillary Refill: Capillary refill takes less than 2 seconds.     Findings: No erythema.  Neurological:     General: No focal deficit present.     Mental Status: He is alert and oriented to person, place, and time. Mental status is at baseline.  Psychiatric:        Mood and Affect: Mood normal.        Behavior: Behavior normal.        Thought Content: Thought content normal.        Judgment: Judgment normal.     UC Treatments / Results  Labs (all labs ordered are listed, but only abnormal results are displayed) Labs Reviewed  URINALYSIS, COMPLETE (UACMP) WITH MICROSCOPIC - Abnormal; Notable for the following components:      Result Value   Specific Gravity, Urine <1.005 (*)    Glucose, UA >1,000 (*)    Bacteria, UA RARE (*)    All other components within normal limits  COMPREHENSIVE METABOLIC PANEL - Abnormal; Notable for the following components:   Sodium 124 (*)    Potassium 5.5 (*)    Chloride 88 (*)    Glucose, Bld 808 (*)  BUN 24 (*)    Creatinine, Ser 1.70 (*)    Total Protein 8.2 (*)    Alkaline Phosphatase 136 (*)    GFR, Estimated  43 (*)    All other components within normal limits  CBC WITH DIFFERENTIAL/PLATELET  HEMOGLOBIN A1C    EKG   Radiology No results found.  Procedures Procedures (including critical care time)  Medications Ordered in UC Medications - No data to display  Initial Impression / Assessment and Plan / UC Course  I have reviewed the triage vital signs and the nursing notes.  Pertinent labs & imaging results that were available during my care of the patient were reviewed by me and considered in my medical decision making (see chart for details).  Patient is a pleasant, nontoxic-appearing 70 year old male here for evaluation of urinary complaints as outlined in HPI above.  The complaint has been increased urination despite fluid restriction without pain or blood going on a month.  He has not had any recent blood work with his primary care provider but did go to a new clinic 3 weeks ago.  Those results not available to [place].  Patient's physical exam reveals a benign cardiopulmonary exam.  No CVA tenderness elicited on exam and abdomen is soft and nontender.  Urinalysis was collected at triage.  Urinalysis shows a low specific gravity of <1.005, >1000 glucose, and rare bacteria.  Urinalysis otherwise unremarkable.  With glucosuria we will draw CBC, CMP, and hemoglobin A1c as I suspect patient may be a new onset diabetic.  My other concern is SIADH though patient is not showing concentration of his urine on his UA.  CBC is unremarkable.  CMP shows critically low sodium of 124, elevated potassium of 5.5, glucose of 808, BUN of 24 and creatinine of 1.70.  I have advised the patient that he needs to be evaluated in the emergency department for his altered chemistry and will probably be admitted.  He has elected to go via privately owned vehicle to [facility].  Called to Pearson Forster the charge nurse in the ER at [facility]   Final Clinical Impressions(s) / UC Diagnoses   Final  diagnoses:  Hyperglycemia  Hyponatremia  Hyperkalemia     Discharge Instructions      Go to the emergency department [facility] to be evaluated for your elevated blood sugar, low sodium, and high potassium.     ED Prescriptions   None    PDMP not reviewed this encounter.   Margarette Canada, NP 12/26/20 1202

## 2020-12-26 NOTE — Discharge Instructions (Addendum)
Go to the emergency department [facility] to be evaluated for your elevated blood sugar, low sodium, and high potassium.

## 2021-01-14 DIAGNOSIS — E1165 Type 2 diabetes mellitus with hyperglycemia: Secondary | ICD-10-CM | POA: Insufficient documentation

## 2021-05-11 DIAGNOSIS — E079 Disorder of thyroid, unspecified: Secondary | ICD-10-CM | POA: Insufficient documentation

## 2021-05-11 DIAGNOSIS — G629 Polyneuropathy, unspecified: Secondary | ICD-10-CM | POA: Insufficient documentation

## 2021-05-26 ENCOUNTER — Other Ambulatory Visit: Payer: Self-pay | Admitting: Specialist

## 2021-05-26 DIAGNOSIS — S46011A Strain of muscle(s) and tendon(s) of the rotator cuff of right shoulder, initial encounter: Secondary | ICD-10-CM

## 2021-05-27 ENCOUNTER — Other Ambulatory Visit: Payer: Self-pay

## 2021-05-27 ENCOUNTER — Ambulatory Visit
Admission: RE | Admit: 2021-05-27 | Discharge: 2021-05-27 | Disposition: A | Discharge status: Home / Self Care | Payer: Medicare Other | Source: Ambulatory Visit | Attending: Specialist | Admitting: Specialist

## 2021-05-27 DIAGNOSIS — S46011A Strain of muscle(s) and tendon(s) of the rotator cuff of right shoulder, initial encounter: Secondary | ICD-10-CM

## 2021-06-06 ENCOUNTER — Ambulatory Visit
Admission: RE | Admit: 2021-06-06 | Discharge: 2021-06-06 | Disposition: A | Discharge status: Home / Self Care | Payer: Medicare Other | Source: Ambulatory Visit | Attending: Specialist | Admitting: Specialist

## 2021-06-06 ENCOUNTER — Other Ambulatory Visit: Payer: Self-pay

## 2021-06-06 DIAGNOSIS — S46011A Strain of muscle(s) and tendon(s) of the rotator cuff of right shoulder, initial encounter: Secondary | ICD-10-CM | POA: Diagnosis not present

## 2021-06-06 IMAGING — MR MR SHOULDER*R* W/O CM
4 of 5 series · 31 of 40 positions shown · non-contrast
Comparison: None.

CLINICAL DATA: Right shoulder pain after fall 4 weeks ago

EXAM:
MRI OF THE RIGHT SHOULDER WITHOUT CONTRAST
TECHNIQUE: Multiplanar, multisequence MR imaging of the shoulder was performed.
No intravenous contrast was administered.

[Series 5: T2 fat-sat · axial · right · 4.0mm · 0.44mm/px · z∈[-59,+59]mm · 8 of 26 slices shown (1 of 3)]
[im 1/26]
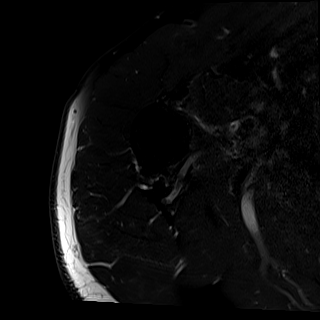
[im 4/26]
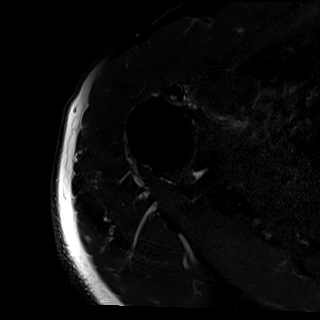
[im 8/26]
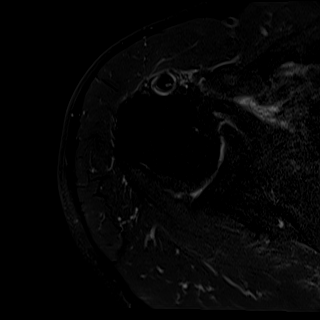
[im 11/26]
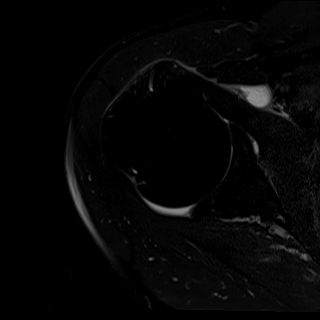
[im 15/26]
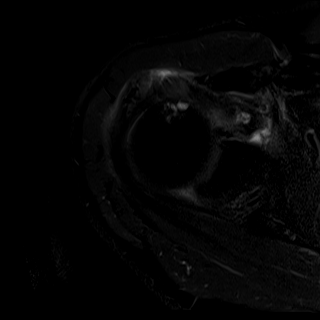
[im 18/26]
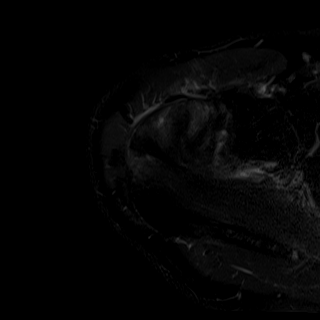
[im 22/26]
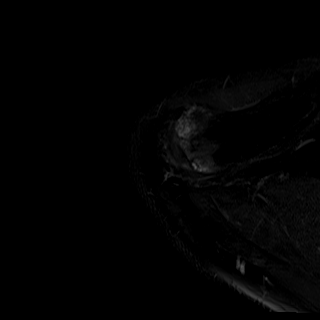
[im 26/26]
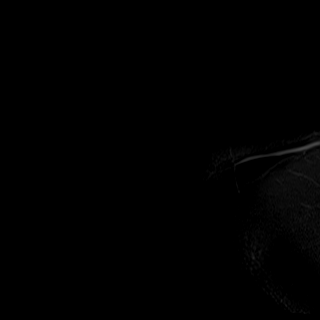

[Series 6: PD · oblique · right · 4.0mm · 0.44mm/px · 9 of 26 slices shown]
[im 1/26]
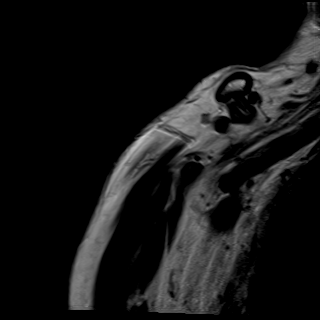
[im 4/26]
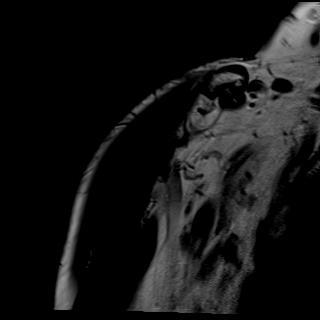
[im 7/26]
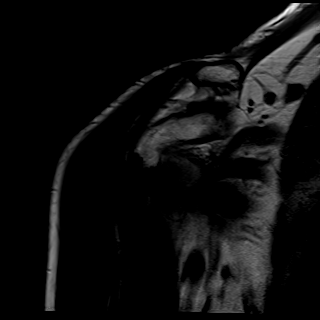
[im 10/26]
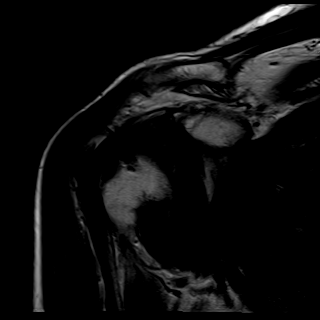
[im 13/26]
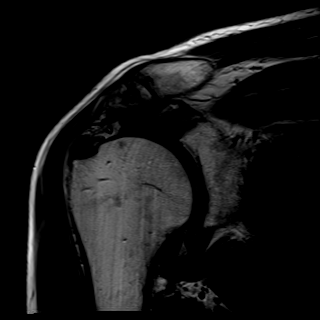
[im 16/26]
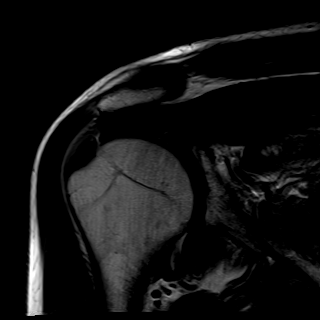
[im 19/26]
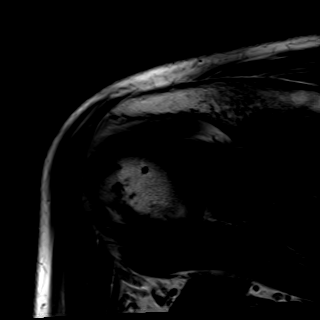
[im 22/26]
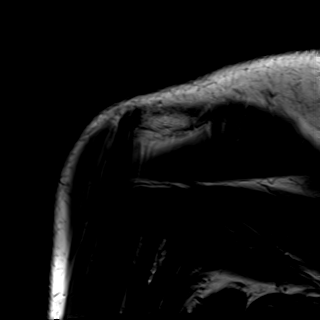
[im 26/26]
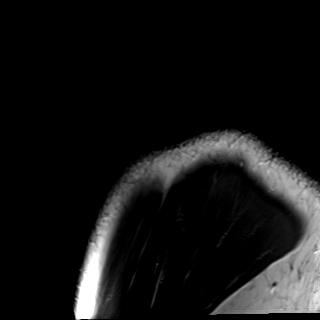

[Series 7: T2 fat-sat · oblique · right · 4.0mm · 0.44mm/px · 9 of 26 slices shown (2 of 3)]
[im 1/26]
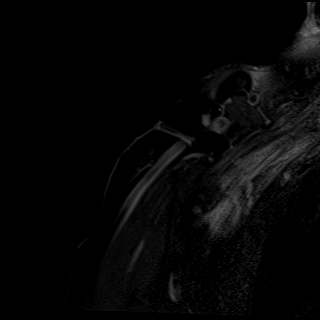
[im 4/26]
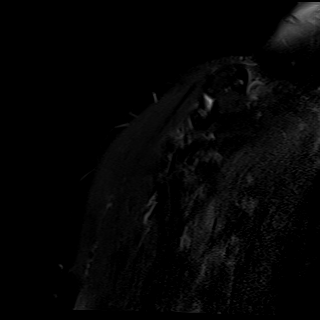
[im 7/26]
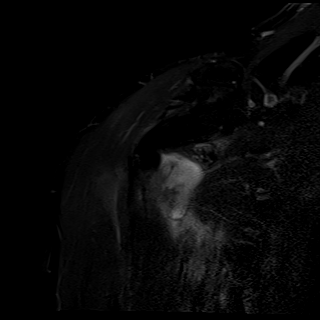
[im 10/26]
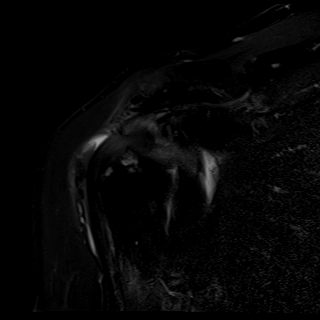
[im 13/26]
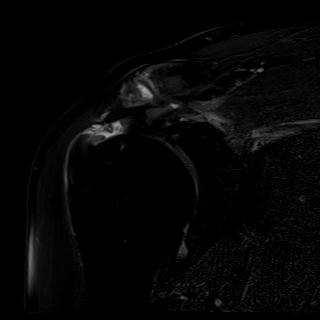
[im 16/26]
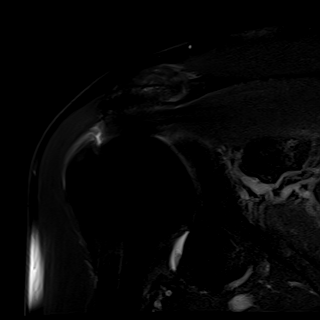
[im 19/26]
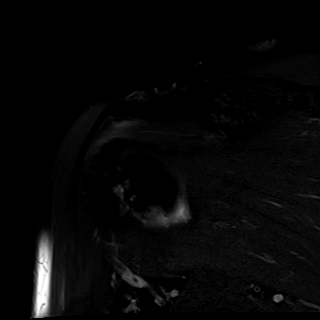
[im 22/26]
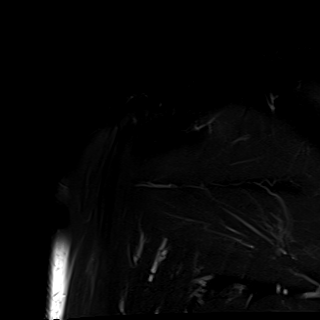
[im 26/26]
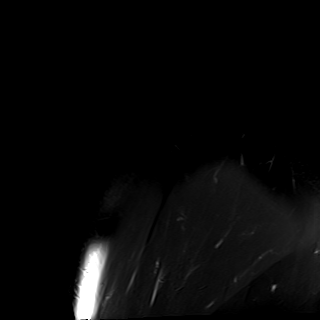

[Series 8: T2 fat-sat · oblique · right · 4.0mm · 0.22mm/px · 5 of 22 slices shown (3 of 3)]
[im 1/22]
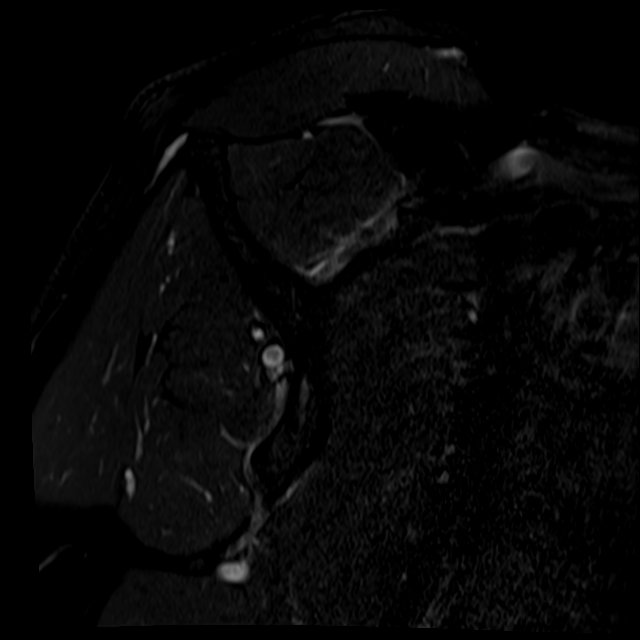
[im 4/22]
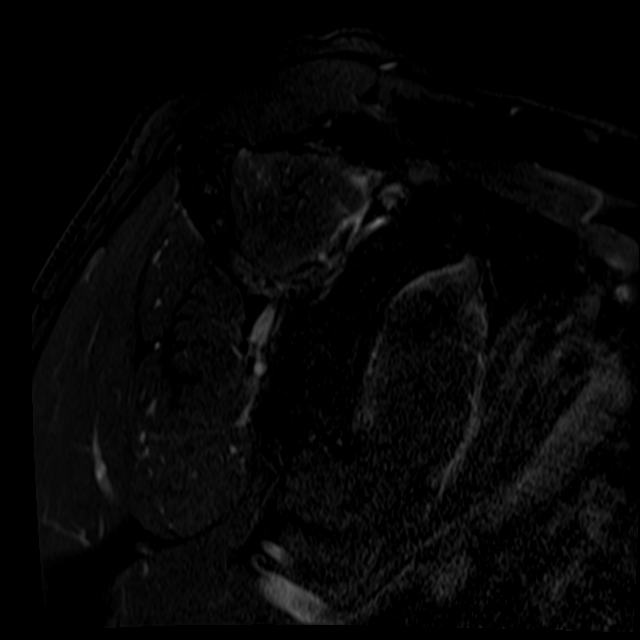
[im 8/22]
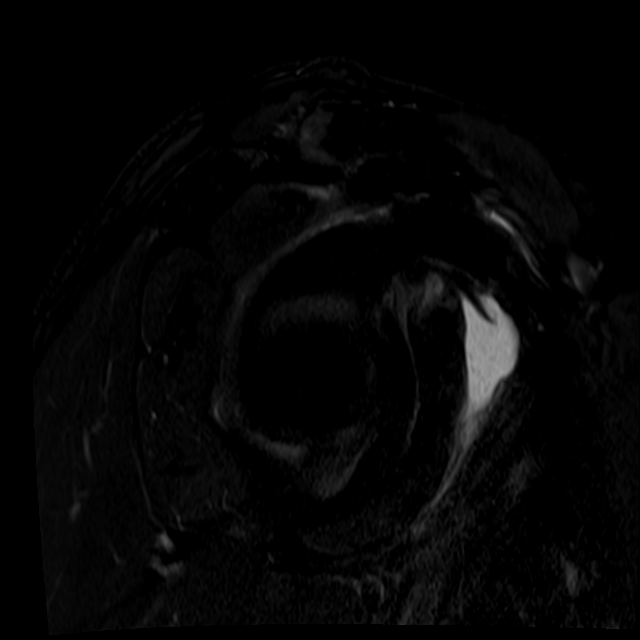
[im 11/22]
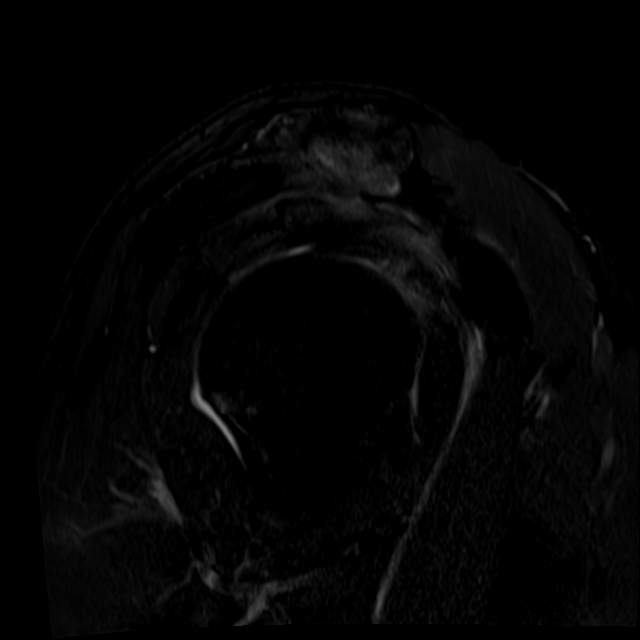
[im 18/22]
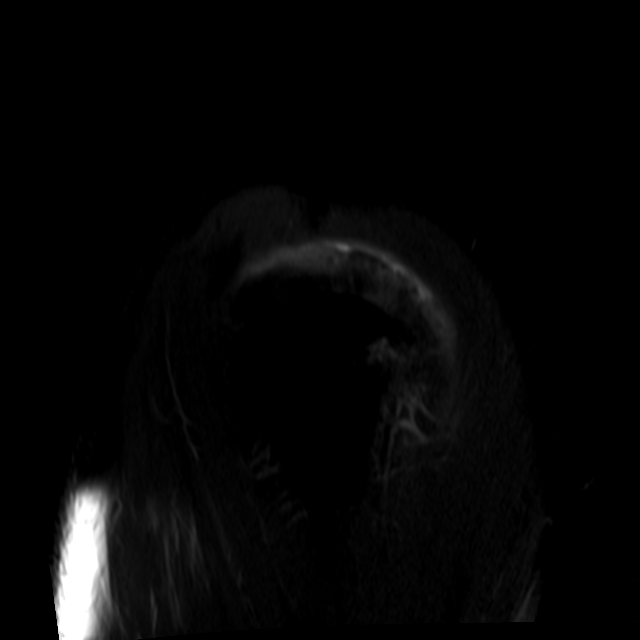

[31 of 40 positions shown; findings below may reference images not displayed]

FINDINGS: Rotator cuff: Severe tendinosis with full-thickness, full-width tear
of the supraspinatus tendon in the region of the critical zone with
mild retraction resulting in up to a 1.3 cm tendon gap.
Mild-moderate infraspinatus and subscapularis tendinosis without
tear. Intact teres minor.

Muscles: Preserved bulk and signal intensity of the rotator cuff
musculature without edema, atrophy, or fatty infiltration.

Biceps long head: Severe tendinosis of the long head biceps tendon
with mild tenosynovitis.

Acromioclavicular Joint: Moderate degenerative changes of the AC
joint. Small AC joint effusion. Small volume subacromial-subdeltoid
bursal fluid communicating with the glenohumeral joint.

Glenohumeral Joint: Small glenohumeral joint effusion. Mild chondral
thinning, most pronounced along the inferior aspect of the
glenohumeral joint.

Labrum: Superior labrum appears degenerated. No well-defined tear on
non-arthrographic imaging.

Bones: No acute fracture. No dislocation. No bone marrow edema. No
marrow replacing bone lesion.

Other: None.
IMPRESSION: 1. Full-thickness, full-width tear of the supraspinatus tendon in
the region of the critical zone with mild retraction resulting in up
to a 1.3 cm tendon gap.
2. Mild-moderate infraspinatus and subscapularis tendinosis without
tear.
3. Severe tendinosis of the long head biceps tendon with mild
tenosynovitis.
4. Moderate AC and mild glenohumeral osteoarthritis.

## 2021-06-22 ENCOUNTER — Other Ambulatory Visit: Payer: Self-pay

## 2021-06-22 ENCOUNTER — Other Ambulatory Visit
Admission: RE | Admit: 2021-06-22 | Discharge: 2021-06-22 | Disposition: A | Discharge status: Home / Self Care | Payer: Medicare Other | Source: Ambulatory Visit | Attending: Orthopedic Surgery | Admitting: Orthopedic Surgery

## 2021-06-22 ENCOUNTER — Other Ambulatory Visit: Payer: Self-pay | Admitting: Orthopedic Surgery

## 2021-06-22 VITALS — Ht 73.5 in | Wt 196.0 lb

## 2021-06-22 DIAGNOSIS — Z01818 Encounter for other preprocedural examination: Secondary | ICD-10-CM | POA: Insufficient documentation

## 2021-06-22 DIAGNOSIS — E1065 Type 1 diabetes mellitus with hyperglycemia: Secondary | ICD-10-CM

## 2021-06-22 HISTORY — DX: Type 2 diabetes mellitus without complications: E11.9

## 2021-06-22 HISTORY — DX: Chronic kidney disease, unspecified: N18.9

## 2021-06-22 LAB — BASIC METABOLIC PANEL
Anion gap: 7 (ref 5–15)
BUN: 30 mg/dL — ABNORMAL HIGH (ref 8–23)
CO2: 25 mmol/L (ref 22–32)
Calcium: 8.6 mg/dL — ABNORMAL LOW (ref 8.9–10.3)
Chloride: 101 mmol/L (ref 98–111)
Creatinine, Ser: 1.3 mg/dL — ABNORMAL HIGH (ref 0.61–1.24)
GFR, Estimated: 60 mL/min — ABNORMAL LOW (ref >60)
Glucose, Bld: 290 mg/dL — ABNORMAL HIGH (ref 70–99)
Potassium: 4 mmol/L (ref 3.5–5.1)
Sodium: 133 mmol/L — ABNORMAL LOW (ref 135–145)

## 2021-06-22 NOTE — Patient Instructions (Signed)
Your procedure is scheduled on: Tuesday June 30, 2021 Report to Day Surgery inside [facility] 2nd floor. To find out your arrival time please call [phone] between 1PM - 3PM on Monday June 29, 2021.  Remember: Instructions that are not followed completely may result in serious medical risk,  up to and including death, or upon the discretion of your surgeon and anesthesiologist your  surgery may need to be rescheduled.     _X__ 1. Do not eat food or drink fluids after midnight the night before your procedure.                 No chewing gum or hard candies.   __X__2.  On the morning of surgery brush your teeth with toothpaste and water, you                may rinse your mouth with mouthwash if you wish.  Do not swallow any toothpaste or mouthwash.     _X__ 3.  No Alcohol for 24 hours before or after surgery.   _X__ 4.  Do Not Smoke or use e-cigarettes For 24 Hours Prior to Your Surgery.                 Do not use any chewable tobacco products for at least 6 hours prior to                 Surgery.  _X__  5.  Do not use any recreational drugs (marijuana, cocaine, heroin, ecstasy, MDMA or other)                For at least one week prior to your surgery.  Combination of these drugs with anesthesia                May have life threatening results.  __X__6.  Notify your doctor if there is any change in your medical condition      (cold, fever, infections).     Do not wear jewelry, make-up, hairpins, clips or nail polish. Do not wear lotions, powders, or perfumes or deodorant. Do not shave 48 hours prior to surgery. Men may shave face and neck. Do not bring valuables to the hospital.    [facility] is not responsible for any belongings or valuables.  Contacts, dentures or bridgework may not be worn into surgery. Leave your suitcase in the car. After surgery it may be brought to your room. For patients admitted to the hospital, discharge time is  determined by your treatment team.   Patients discharged the day of surgery will not be allowed to drive home.   Make arrangements for someone to be with you for the first 24 hours of your Same Day Discharge.   __X__ Take these medicines the morning of surgery with A SIP OF WATER:    1. levothyroxine (SYNTHROID) 112 MCG  2. liothyronine (CYTOMEL) 5 MCG  3.   4.  5.  6.  ____ Fleet Enema (as directed)   __X__ Use CHG Soap (or wipes) as directed  ____ Use Benzoyl Peroxide Gel as instructed  ____ Use inhalers on the day of surgery  ____ Stop metformin 2 days prior to surgery    __X__ Take 1/2 of usual insulin dose the night before surgery. No insulin the morning          of surgery. TRESIBA FLEXTOUCH 100 UNIT/ML FlexTouch Pen or NOVOLOG FLEXPEN 100 UNIT/ML FlexPen  ____ Call your PCP, cardiologist, or Pulmonologist  if taking Coumadin/Plavix/aspirin and ask when to stop before your surgery.   __X__ One Week prior to surgery- Stop Anti-inflammatories such as Ibuprofen, Aleve, Advil, Motrin, meloxicam (MOBIC), diclofenac, etodolac, ketorolac, Toradol, Daypro, piroxicam, Goody's or BC powders. OK TO USE TYLENOL IF NEEDED   __X__ Stop supplements until after surgery.    ____ Bring C-Pap to the hospital.    If you have any questions regarding your pre-procedure instructions,  Please call Pre-admit Testing at [phone]

## 2021-06-24 NOTE — Pre-Procedure Instructions (Signed)
Fax would not go thru to [facility] internal medicine on 06-22-21-Continued to say busy-Refaxed on 06-23-21 and fax went thru-saw a telephone encounter in care every where from 06-23-21 that stated that pt needs to come in for clearance and repeat ekg.   Received fax back today that states when office called to set up appt for clearance that pts wife and himself stated that he had a new PCP and that he had not seen Dr Ouida Sills in 2 years. Pt called to PAT and I spoke with him. I asked him who his new PCP was and he said Teacher, early years/pre at [place]. I told pt I had sent the clearance to Dr Ouida Sills bc pt had just seen him on1-6-22 which was only a year ago. Pt states that he wants to be cleared by Dr Ouida Sills bc there would ne no way his new pcp could get him cleared prior to surgery. But now that pt states he has a new pcp now I will have to restart the whole clearance process again with Dr Ouida Sills to see if he will clear pt.   I informed pt that he really needs to call Dr Sharyon Cable office to see if they will let him be cleared there now since he has told them he has a new PCP.Pt verbalized he would and would call me back to let me know

## 2021-06-26 NOTE — Pre-Procedure Instructions (Signed)
Medical clearance on chart from Dr Ouida Sills

## 2021-06-29 NOTE — Pre-Procedure Instructions (Signed)
Medical Clearance requested by Anesthesia for abnormal EKG-Faxed this over to Dr Sharyon Cable office-Pt has not been seen by Ouida Sills in 1 year.

## 2021-07-14 ENCOUNTER — Ambulatory Visit
Admission: RE | Admit: 2021-07-14 | Discharge: 2021-07-14 | Disposition: A | Discharge status: Home / Self Care | Payer: Medicare Other | Attending: Orthopedic Surgery | Admitting: Orthopedic Surgery

## 2021-07-14 ENCOUNTER — Encounter
Admission: RE | Disposition: A | Discharge status: Home / Self Care | Payer: Self-pay | Source: Home / Self Care | Attending: Orthopedic Surgery

## 2021-07-14 ENCOUNTER — Ambulatory Visit: Payer: Medicare Other | Admitting: Anesthesiology

## 2021-07-14 ENCOUNTER — Other Ambulatory Visit: Payer: Self-pay

## 2021-07-14 ENCOUNTER — Ambulatory Visit: Payer: Medicare Other

## 2021-07-14 ENCOUNTER — Encounter: Payer: Self-pay | Admitting: Orthopedic Surgery

## 2021-07-14 DIAGNOSIS — E079 Disorder of thyroid, unspecified: Secondary | ICD-10-CM | POA: Insufficient documentation

## 2021-07-14 DIAGNOSIS — M75121 Complete rotator cuff tear or rupture of right shoulder, not specified as traumatic: Secondary | ICD-10-CM | POA: Insufficient documentation

## 2021-07-14 DIAGNOSIS — M19011 Primary osteoarthritis, right shoulder: Secondary | ICD-10-CM | POA: Diagnosis not present

## 2021-07-14 DIAGNOSIS — S43431A Superior glenoid labrum lesion of right shoulder, initial encounter: Secondary | ICD-10-CM | POA: Diagnosis not present

## 2021-07-14 DIAGNOSIS — X58XXXA Exposure to other specified factors, initial encounter: Secondary | ICD-10-CM | POA: Insufficient documentation

## 2021-07-14 DIAGNOSIS — S46211A Strain of muscle, fascia and tendon of other parts of biceps, right arm, initial encounter: Secondary | ICD-10-CM | POA: Insufficient documentation

## 2021-07-14 DIAGNOSIS — M25811 Other specified joint disorders, right shoulder: Secondary | ICD-10-CM | POA: Diagnosis not present

## 2021-07-14 DIAGNOSIS — E1022 Type 1 diabetes mellitus with diabetic chronic kidney disease: Secondary | ICD-10-CM | POA: Diagnosis not present

## 2021-07-14 DIAGNOSIS — N189 Chronic kidney disease, unspecified: Secondary | ICD-10-CM | POA: Insufficient documentation

## 2021-07-14 DIAGNOSIS — Z419 Encounter for procedure for purposes other than remedying health state, unspecified: Secondary | ICD-10-CM

## 2021-07-14 DIAGNOSIS — Z8546 Personal history of malignant neoplasm of prostate: Secondary | ICD-10-CM | POA: Diagnosis not present

## 2021-07-14 HISTORY — PX: BICEPT TENODESIS: SHX5116

## 2021-07-14 HISTORY — PX: SHOULDER ARTHROSCOPY WITH OPEN ROTATOR CUFF REPAIR AND DISTAL CLAVICLE ACROMINECTOMY: SHX5683

## 2021-07-14 LAB — GLUCOSE, CAPILLARY
Glucose-Capillary: 263 mg/dL — ABNORMAL HIGH (ref 70–99)
Glucose-Capillary: 224 mg/dL — ABNORMAL HIGH (ref 70–99)

## 2021-07-14 SURGERY — SHOULDER ARTHROSCOPY WITH OPEN ROTATOR CUFF REPAIR AND DISTAL CLAVICLE ACROMINECTOMY
Anesthesia: General | Site: Shoulder | Laterality: Right

## 2021-07-14 MED ORDER — PHENYLEPHRINE 40 MCG/ML (10ML) SYRINGE FOR IV PUSH (FOR BLOOD PRESSURE SUPPORT)
PREFILLED_SYRINGE | INTRAVENOUS | Status: DC | PRN
  Administered 2021-07-14: 80 ug via INTRAVENOUS
  Administered 2021-07-14: 160 ug via INTRAVENOUS
  Administered 2021-07-14: 80 ug via INTRAVENOUS

## 2021-07-14 MED ORDER — SUGAMMADEX SODIUM 200 MG/2ML IV SOLN
INTRAVENOUS | Status: DC | PRN
  Administered 2021-07-14 (×2): 100 mg via INTRAVENOUS

## 2021-07-14 MED ORDER — GLYCOPYRROLATE 0.2 MG/ML IJ SOLN
INTRAMUSCULAR | Status: AC
  Filled 2021-07-14: qty 1

## 2021-07-14 MED ORDER — SODIUM CHLORIDE 0.9 % IV SOLN: INTRAVENOUS | Status: DC

## 2021-07-14 MED ORDER — GLYCOPYRROLATE 0.2 MG/ML IJ SOLN
INTRAMUSCULAR | Status: DC | PRN
  Administered 2021-07-14: .2 mg via INTRAVENOUS

## 2021-07-14 MED ORDER — ROCURONIUM BROMIDE 10 MG/ML (PF) SYRINGE
PREFILLED_SYRINGE | INTRAVENOUS | Status: AC
  Filled 2021-07-14: qty 10

## 2021-07-14 MED ORDER — MIDAZOLAM HCL 2 MG/2ML IJ SOLN
1.0000 mg | INTRAMUSCULAR | Status: AC | PRN
  Administered 2021-07-14: 1 mg via INTRAVENOUS

## 2021-07-14 MED ORDER — BUPIVACAINE HCL (PF) 0.5 % IJ SOLN
INTRAMUSCULAR | Status: DC | PRN
  Administered 2021-07-14: 20 mL via PERINEURAL

## 2021-07-14 MED ORDER — FENTANYL CITRATE (PF) 100 MCG/2ML IJ SOLN
INTRAMUSCULAR | Status: AC
  Filled 2021-07-14: qty 2

## 2021-07-14 MED ORDER — ONDANSETRON HCL 4 MG/2ML IJ SOLN
INTRAMUSCULAR | Status: AC
  Filled 2021-07-14: qty 2

## 2021-07-14 MED ORDER — PROPOFOL 10 MG/ML IV BOLUS
INTRAVENOUS | Status: DC | PRN
  Administered 2021-07-14: 200 mg via INTRAVENOUS

## 2021-07-14 MED ORDER — FENTANYL CITRATE PF 50 MCG/ML IJ SOSY: 50.0000 ug | PREFILLED_SYRINGE | INTRAMUSCULAR | Status: DC | PRN

## 2021-07-14 MED ORDER — SODIUM CHLORIDE 0.9 % IR SOLN
Status: DC | PRN
  Administered 2021-07-14: 502 mL

## 2021-07-14 MED ORDER — FAMOTIDINE 20 MG PO TABS: 20.0000 mg | ORAL_TABLET | Freq: Once | ORAL | Status: AC

## 2021-07-14 MED ORDER — LIDOCAINE HCL (PF) 1 % IJ SOLN
INTRAMUSCULAR | Status: AC
  Filled 2021-07-14: qty 5

## 2021-07-14 MED ORDER — MIDAZOLAM HCL 2 MG/2ML IJ SOLN
INTRAMUSCULAR | Status: AC
  Administered 2021-07-14: 1 mg via INTRAVENOUS
  Filled 2021-07-14: qty 2

## 2021-07-14 MED ORDER — ORAL CARE MOUTH RINSE: 15.0000 mL | Freq: Once | OROMUCOSAL | Status: AC

## 2021-07-14 MED ORDER — FENTANYL CITRATE (PF) 100 MCG/2ML IJ SOLN
INTRAMUSCULAR | Status: DC | PRN
  Administered 2021-07-14 (×2): 25 ug via INTRAVENOUS
  Administered 2021-07-14: 50 ug via INTRAVENOUS

## 2021-07-14 MED ORDER — PHENYLEPHRINE HCL-NACL 20-0.9 MG/250ML-% IV SOLN
INTRAVENOUS | Status: DC | PRN
  Administered 2021-07-14: 35 ug/min via INTRAVENOUS

## 2021-07-14 MED ORDER — ONDANSETRON HCL 4 MG PO TABS: 4.0000 mg | ORAL_TABLET | Freq: Three times a day (TID) | ORAL | 0 refills | Status: DC | PRN

## 2021-07-14 MED ORDER — ONDANSETRON HCL 4 MG/2ML IJ SOLN
INTRAMUSCULAR | Status: DC | PRN
  Administered 2021-07-14: 4 mg via INTRAVENOUS

## 2021-07-14 MED ORDER — ROCURONIUM BROMIDE 100 MG/10ML IV SOLN
INTRAVENOUS | Status: DC | PRN
  Administered 2021-07-14: 10 mg via INTRAVENOUS
  Administered 2021-07-14: 60 mg via INTRAVENOUS
  Administered 2021-07-14: 10 mg via INTRAVENOUS

## 2021-07-14 MED ORDER — LIDOCAINE HCL (PF) 1 % IJ SOLN
INTRAMUSCULAR | Status: DC | PRN
Start: 2021-07-14 — End: 2021-07-14
  Administered 2021-07-14 (×2): 3 mL

## 2021-07-14 MED ORDER — BUPIVACAINE LIPOSOME 1.3 % IJ SUSP
INTRAMUSCULAR | Status: AC
  Filled 2021-07-14: qty 20

## 2021-07-14 MED ORDER — PROPOFOL 10 MG/ML IV BOLUS
INTRAVENOUS | Status: AC
  Filled 2021-07-14: qty 20

## 2021-07-14 MED ORDER — ACETAMINOPHEN 500 MG PO TABS
ORAL_TABLET | ORAL | Status: AC
  Administered 2021-07-14: 1000 mg via ORAL
  Filled 2021-07-14: qty 2

## 2021-07-14 MED ORDER — CHLORHEXIDINE GLUCONATE 0.12 % MT SOLN
OROMUCOSAL | Status: AC
  Administered 2021-07-14: 15 mL via OROMUCOSAL
  Filled 2021-07-14: qty 15

## 2021-07-14 MED ORDER — CHLORHEXIDINE GLUCONATE CLOTH 2 % EX PADS
6.0000 | MEDICATED_PAD | Freq: Once | CUTANEOUS | Status: AC
  Administered 2021-07-14: 6 via TOPICAL

## 2021-07-14 MED ORDER — EPINEPHRINE PF 1 MG/ML IJ SOLN
INTRAMUSCULAR | Status: AC
  Filled 2021-07-14: qty 4

## 2021-07-14 MED ORDER — CEFAZOLIN SODIUM-DEXTROSE 2-4 GM/100ML-% IV SOLN
2.0000 g | INTRAVENOUS | Status: AC
  Administered 2021-07-14: 2 g via INTRAVENOUS

## 2021-07-14 MED ORDER — LACTATED RINGERS IR SOLN
Status: DC | PRN
  Administered 2021-07-14 (×8): 3000 mL

## 2021-07-14 MED ORDER — BUPIVACAINE HCL (PF) 0.5 % IJ SOLN
INTRAMUSCULAR | Status: AC
  Filled 2021-07-14: qty 10

## 2021-07-14 MED ORDER — LACTATED RINGERS IR SOLN
Status: DC | PRN
  Administered 2021-07-14 (×4): 3001 mL

## 2021-07-14 MED ORDER — ACETAMINOPHEN 500 MG PO TABS: 1000.0000 mg | ORAL_TABLET | ORAL | Status: AC

## 2021-07-14 MED ORDER — INSULIN ASPART 100 UNIT/ML IJ SOLN: 3.0000 [IU] | Freq: Once | INTRAMUSCULAR | Status: AC

## 2021-07-14 MED ORDER — FAMOTIDINE 20 MG PO TABS
ORAL_TABLET | ORAL | Status: AC
  Administered 2021-07-14: 20 mg via ORAL
  Filled 2021-07-14: qty 1

## 2021-07-14 MED ORDER — DEXAMETHASONE SODIUM PHOSPHATE 10 MG/ML IJ SOLN
INTRAMUSCULAR | Status: AC
  Filled 2021-07-14: qty 1

## 2021-07-14 MED ORDER — OXYCODONE HCL 5 MG PO TABS: 5.0000 mg | ORAL_TABLET | ORAL | 0 refills | Status: DC | PRN

## 2021-07-14 MED ORDER — FENTANYL CITRATE PF 50 MCG/ML IJ SOSY
PREFILLED_SYRINGE | INTRAMUSCULAR | Status: AC
  Filled 2021-07-14: qty 2

## 2021-07-14 MED ORDER — INSULIN ASPART 100 UNIT/ML IJ SOLN
INTRAMUSCULAR | Status: AC
  Administered 2021-07-14: 3 [IU] via SUBCUTANEOUS
  Filled 2021-07-14: qty 1

## 2021-07-14 MED ORDER — CHLORHEXIDINE GLUCONATE 0.12 % MT SOLN: 15.0000 mL | Freq: Once | OROMUCOSAL | Status: AC

## 2021-07-14 MED ORDER — EPHEDRINE 5 MG/ML INJ
INTRAVENOUS | Status: AC
  Filled 2021-07-14: qty 5

## 2021-07-14 MED ORDER — DEXAMETHASONE SODIUM PHOSPHATE 10 MG/ML IJ SOLN
INTRAMUSCULAR | Status: DC | PRN
  Administered 2021-07-14: 5 mg via INTRAVENOUS

## 2021-07-14 MED ORDER — LIDOCAINE HCL (PF) 2 % IJ SOLN
INTRAMUSCULAR | Status: AC
  Filled 2021-07-14: qty 5

## 2021-07-14 MED ORDER — EPHEDRINE SULFATE (PRESSORS) 50 MG/ML IJ SOLN
INTRAMUSCULAR | Status: DC | PRN
  Administered 2021-07-14 (×3): 5 mg via INTRAVENOUS

## 2021-07-14 MED ORDER — CEFAZOLIN SODIUM-DEXTROSE 2-4 GM/100ML-% IV SOLN
INTRAVENOUS | Status: AC
  Filled 2021-07-14: qty 100

## 2021-07-14 MED ORDER — NEOMYCIN-POLYMYXIN B GU 40-200000 IR SOLN
Status: AC
  Filled 2021-07-14: qty 2

## 2021-07-14 MED ORDER — BUPIVACAINE LIPOSOME 1.3 % IJ SUSP
INTRAMUSCULAR | Status: DC | PRN
  Administered 2021-07-14: 10 mL via PERINEURAL

## 2021-07-14 MED ORDER — LIDOCAINE HCL (CARDIAC) PF 100 MG/5ML IV SOSY
PREFILLED_SYRINGE | INTRAVENOUS | Status: DC | PRN
  Administered 2021-07-14: 80 mg via INTRAVENOUS

## 2021-07-14 SURGICAL SUPPLY — 69 items
ANCH SUT 5.5 KNTLS PEEK (Orthopedic Implant) ×3 IMPLANT
ANCH SUT Q-FX 2.8 (Anchor) ×2 IMPLANT
ANCHOR ALL-SUT Q-FIX 2.8 (Anchor) ×10 IMPLANT
ANCHOR SUT 5.5 MULTIFIX (Orthopedic Implant) ×3 IMPLANT
ANCHOR SUT 5.5MM MULTIFIX (Orthopedic Implant) IMPLANT
ANCHOR SUT BIOC ST 3X145 (Anchor) IMPLANT
CANNULA 5.75X7 CRYSTAL CLEAR (CANNULA) ×4 IMPLANT
CANNULA PARTIAL THREAD 2X7 (CANNULA) ×2 IMPLANT
CANNULA TWIST IN 8.25X9CM (CANNULA) ×4 IMPLANT
CONNECTOR PERFECT PASSER (CONNECTOR) ×3 IMPLANT
COOLER POLAR GLACIER W/PUMP (MISCELLANEOUS) ×2 IMPLANT
DEVICE SUCT BLK HOLE OR FLOOR (MISCELLANEOUS) ×4 IMPLANT
DRAPE 3/4 80X56 (DRAPES) ×2 IMPLANT
DRAPE INCISE IOBAN 66X45 STRL (DRAPES) ×2 IMPLANT
DRAPE U-SHAPE 47X51 STRL (DRAPES) ×2 IMPLANT
DURAPREP 26ML APPLICATOR (WOUND CARE) ×7 IMPLANT
ELECT REM PT RETURN 9FT ADLT (ELECTROSURGICAL) ×2
ELECTRODE REM PT RTRN 9FT ADLT (ELECTROSURGICAL) ×1 IMPLANT
GAUZE SPONGE 4X4 12PLY STRL (GAUZE/BANDAGES/DRESSINGS) ×2 IMPLANT
GAUZE XEROFORM 1X8 LF (GAUZE/BANDAGES/DRESSINGS) ×2 IMPLANT
GLOVE SURG ORTHO LTX SZ9 (GLOVE) ×6 IMPLANT
GLOVE SURG UNDER POLY LF SZ9 (GLOVE) ×2 IMPLANT
GOWN STRL REUS TWL 2XL XL LVL4 (GOWN DISPOSABLE) ×2 IMPLANT
GOWN STRL REUS W/ TWL LRG LVL3 (GOWN DISPOSABLE) ×1 IMPLANT
GOWN STRL REUS W/TWL LRG LVL3 (GOWN DISPOSABLE) ×2
IV LACTATED RINGER IRRG 3000ML (IV SOLUTION) ×24
IV LR IRRIG 3000ML ARTHROMATIC (IV SOLUTION) ×8 IMPLANT
KIT STABILIZATION SHOULDER (MISCELLANEOUS) ×2 IMPLANT
KIT SUTURE 2.8 Q-FIX DISP (MISCELLANEOUS) ×2 IMPLANT
KIT SUTURETAK 3.0 INSERT PERC (KITS) IMPLANT
KIT TURNOVER KIT A (KITS) ×2 IMPLANT
MANIFOLD NEPTUNE II (INSTRUMENTS) ×4 IMPLANT
MASK FACE SPIDER DISP (MASK) ×2 IMPLANT
MAT ABSORB  FLUID 56X50 GRAY (MISCELLANEOUS) ×2
MAT ABSORB FLUID 56X50 GRAY (MISCELLANEOUS) ×3 IMPLANT
NDL SAFETY ECLIPSE 18X1.5 (NEEDLE) ×1 IMPLANT
NEEDLE HYPO 18GX1.5 SHARP (NEEDLE) ×2
NEEDLE HYPO 22GX1.5 SAFETY (NEEDLE) ×2 IMPLANT
NS IRRIG 500ML POUR BTL (IV SOLUTION) ×2 IMPLANT
PACK ARTHROSCOPY SHOULDER (MISCELLANEOUS) ×2 IMPLANT
PAD ABD DERMACEA PRESS 5X9 (GAUZE/BANDAGES/DRESSINGS) ×1 IMPLANT
PAD ARMBOARD 7.5X6 YLW CONV (MISCELLANEOUS) ×4 IMPLANT
PAD WRAPON POLAR SHDR XLG (MISCELLANEOUS) ×1 IMPLANT
PASSER SUT FIRSTPASS SELF (INSTRUMENTS) ×2 IMPLANT
SHAVER BLADE BONE CUTTER 4.5 (BLADE) ×2 IMPLANT
SHAVER BLADE TAPERED BLUNT 4 (BLADE) ×2 IMPLANT
SPONGE T-LAP 18X18 ~~LOC~~+RFID (SPONGE) ×2 IMPLANT
STRIP CLOSURE SKIN 1/2X4 (GAUZE/BANDAGES/DRESSINGS) ×1 IMPLANT
SUT ETHILON 4-0 (SUTURE) ×2
SUT ETHILON 4-0 FS2 18XMFL BLK (SUTURE) ×1
SUT LASSO 90 DEG SD STR (SUTURE) IMPLANT
SUT MNCRL 4-0 (SUTURE) ×2
SUT MNCRL 4-0 27XMFL (SUTURE) ×1
SUT PDS AB 0 CT1 27 (SUTURE) ×6 IMPLANT
SUT PERFECTPASSER WHITE CART (SUTURE) ×12 IMPLANT
SUT SMART STITCH CARTRIDGE (SUTURE) ×8 IMPLANT
SUT ULTRABRAID 2 COBRAID 38 (SUTURE) ×3 IMPLANT
SUT VIC AB 0 CT1 36 (SUTURE) ×4 IMPLANT
SUT VIC AB 2-0 CT2 27 (SUTURE) ×2 IMPLANT
SUTURE ETHLN 4-0 FS2 18XMF BLK (SUTURE) ×1 IMPLANT
SUTURE MNCRL 4-0 27XMF (SUTURE) ×1 IMPLANT
SYR 10ML LL (SYRINGE) ×2 IMPLANT
TAPE MICROFOAM 4IN (TAPE) ×2 IMPLANT
TUBING CONNECTING 10 (TUBING) ×2 IMPLANT
TUBING INFLOW SET DBFLO PUMP (TUBING) ×2 IMPLANT
TUBING OUTFLOW SET DBLFO PUMP (TUBING) ×2 IMPLANT
WAND WEREWOLF FLOW 90D (MISCELLANEOUS) ×1 IMPLANT
WATER STERILE IRR 500ML POUR (IV SOLUTION) ×2 IMPLANT
WRAPON POLAR PAD SHDR XLG (MISCELLANEOUS) ×2

## 2021-07-14 NOTE — H&P (Signed)
PREOPERATIVE H&P  Chief Complaint: Right Shoulder Rotator Cuff Tear  HPI: Jared Hess is a 71 y.o. male who presents for preoperative history and physical with a diagnosis of Right Shoulder Rotator Cuff Tear confirmed by MRI. Symptoms of pain, weakness and limited ROM are significantly impairing activities of daily living.  He failed nonoperative management wished to proceed with surgical of his right shoulder rotator cuff tear.  Past Medical History:  Diagnosis Date   Chronic kidney disease    Diabetes mellitus without complication ([place])    Prostate cancer ([place])    Thyroid disease    Past Surgical History:  Procedure Laterality Date   PROSTATE CRYOABLATION  2010   shoulder blade surgery  2002   Social History   Socioeconomic History   Marital status: Married    Spouse name: Not on file   Number of children: Not on file   Years of education: Not on file   Highest education level: Not on file  Occupational History   Not on file  Tobacco Use   Smoking status: Never   Smokeless tobacco: Never  Vaping Use   Vaping Use: Never used  Substance and Sexual Activity   Alcohol use: Yes   Drug use: No   Sexual activity: Not on file  Other Topics Concern   Not on file  Social History Narrative   Not on file   Social Determinants of Health   Financial Resource Strain: Not on file  Food Insecurity: Not on file  Transportation Needs: Not on file  Physical Activity: Not on file  Stress: Not on file  Social Connections: Not on file   Family History  Problem Relation Age of Onset   Prostate cancer Neg Hx    Bladder Cancer Neg Hx    Kidney cancer Neg Hx    No Known Allergies Prior to Admission medications   Medication Sig Start Date End Date Taking? Authorizing Provider  levothyroxine (SYNTHROID) 112 MCG tablet Take 112 mcg by mouth daily before breakfast. 01/30/19  Yes [provider]  liothyronine (CYTOMEL) 5 MCG tablet Take 5 mcg by mouth daily. 01/13/21  Yes  [provider]  NOVOLOG FLEXPEN 100 UNIT/ML FlexPen Inject 4 Units into the skin 3 (three) times daily before meals. 05/15/21  Yes [provider]  TRESIBA FLEXTOUCH 100 UNIT/ML FlexTouch Pen Inject 17 Units into the skin every morning. 04/26/21  Yes [provider]     Positive ROS: All other systems have been reviewed and were otherwise negative with the exception of those mentioned in the HPI and as above.  Physical Exam: General: Alert, no acute distress Cardiovascular: Regular rate and rhythm, no murmurs rubs or gallops.  No pedal edema Respiratory: Clear to auscultation bilaterally, no wheezes rales or rhonchi. No cyanosis, no use of accessory musculature GI: No organomegaly, abdomen is soft and non-tender nondistended with positive bowel sounds. Skin: Skin intact, no lesions within the operative field. Neurologic: Sensation intact distally Psychiatric: Patient is competent for consent with normal mood and affect Lymphatic: No cervical lymphadenopathy  MUSCULOSKELETAL: Right Shoulder: The patient has significant pain with any attempted active abduction to the right shoulder. He demonstrates significant pain with resisted right shoulder abduction. He has full digital, wrist and elbow range of motion, intact sensation to light touch and has a palpable radial pulse. The patient did not demonstrate significant weakness with shoulder external rotation or internal rotation, however.   Radiology: I reviewed the patient's MRI from [facility], performed on  06/06/2021. The patient has a full-thickness, full width tear of the supraspinatus with retraction of approximately 1.5 cm. He had moderate infraspinatus and subscapularis tendinosis without tears. The patient had severe tendinosis of the long head of the biceps tendon and moderate AC joint arthrosis and mild glenohumeral joint arthrosis. There is no evidence of rotator cuff muscle atrophy on the lateral images of  his right shoulder MRI.   Assessment: Right Shoulder Rotator Cuff Tear  Plan: Plan for Procedure(s): RIGHT SHOULDER ARTHROSCOPY SUBACROMIAL DECOMPRESSION, DISTAL CLAVICLE  WITH OPEN ROTATOR CUFF REPAIR AND BICEPS TENODESIS  I met the patient in the preoperative area this morning.  I marked the right shoulder according the hospital's correct site of surgery protocol.  Operative history and physical was performed at the bedside.  He received an interscalene block with Exparel by the anesthesia service.  I reviewed each of the operation as well as the postoperative course with the patient.  I discussed the risks and benefits of surgery. The risks include but are not limited to infection, bleeding, nerve or blood vessel injury, joint stiffness or loss of motion, persistent pain, weakness or instability, retear of the rotator cuff, Popeye deformity of the biceps, failure of the repair and the need for further surgery.  Patient understood these risks and wished to proceed.     Thornton Park, MD   07/14/2021 8:50 AM

## 2021-07-14 NOTE — Anesthesia Procedure Notes (Signed)
Procedure Name: Intubation Date/Time: 07/14/2021 9:26 AM Performed by: Loletha Grayer, CRNA Pre-anesthesia Checklist: Patient identified, Patient being monitored, Timeout performed, Emergency Drugs available and Suction available Patient Re-evaluated:Patient Re-evaluated prior to induction Oxygen Delivery Method: Circle system utilized Preoxygenation: Pre-oxygenation with 100% oxygen Induction Type: IV induction Ventilation: Mask ventilation without difficulty Laryngoscope Size: Mac and 4 Grade View: Grade II Tube type: Oral Tube size: 7.5 mm Number of attempts: 1 Airway Equipment and Method: Stylet Placement Confirmation: ETT inserted through vocal cords under direct vision, positive ETCO2 and breath sounds checked- equal and bilateral Secured at: 23 cm Tube secured with: Tape Dental Injury: Teeth and Oropharynx as per pre-operative assessment

## 2021-07-14 NOTE — Anesthesia Preprocedure Evaluation (Signed)
Anesthesia Evaluation  Patient identified by MRN, date of birth, ID band Patient awake    Reviewed: Allergy & Precautions, H&P , NPO status , Patient's Chart, lab work & pertinent test results, reviewed documented beta blocker date and time   Airway Mallampati: II  TM Distance: >3 FB Neck ROM: full    Dental  (+) Teeth Intact   Pulmonary neg pulmonary ROS,    Pulmonary exam normal        Cardiovascular Exercise Tolerance: Good negative cardio ROS Normal cardiovascular exam Rhythm:regular Rate:Normal     Neuro/Psych negative neurological ROS  negative psych ROS   GI/Hepatic negative GI ROS, Neg liver ROS,   Endo/Other  negative endocrine ROSdiabetes, Well Controlled, Type 1, Insulin Dependent  Renal/GU Renal disease  negative genitourinary   Musculoskeletal   Abdominal   Peds  Hematology negative hematology ROS (+)   Anesthesia Other Findings Past Medical History: No date: Chronic kidney disease No date: Diabetes mellitus without complication (HCC) No date: Prostate cancer ([place]) No date: Thyroid disease Past Surgical History: 2010: PROSTATE CRYOABLATION 2002: shoulder blade surgery   Reproductive/Obstetrics negative OB ROS                             Anesthesia Physical Anesthesia Plan  ASA: 2  Anesthesia Plan: General ETT   Post-op Pain Management: Regional block   Induction:   PONV Risk Score and Plan:   Airway Management Planned:   Additional Equipment:   Intra-op Plan:   Post-operative Plan:   Informed Consent: I have reviewed the patients History and Physical, chart, labs and discussed the procedure including the risks, benefits and alternatives for the proposed anesthesia with the patient or authorized representative who has indicated his/her understanding and acceptance.     Dental Advisory Given  Plan Discussed with: CRNA  Anesthesia Plan Comments:          Anesthesia Quick Evaluation

## 2021-07-14 NOTE — Transfer of Care (Signed)
Immediate Anesthesia Transfer of Care Note  Patient: Jared Hess  Procedure(s) Performed: SHOULDER ARTHROSCOPY WITH OPEN ROTATOR CUFF REPAIR AND DISTAL CLAVICLE ACROMINECTOMY (Right: Shoulder) BICEPS TENODESIS (Right: Shoulder)  Patient Location: PACU  Anesthesia Type:General  Level of Consciousness: awake  Airway & Oxygen Therapy: Patient Spontanous Breathing and Patient connected to face mask oxygen  Post-op Assessment: Report given to RN and Post -op Vital signs reviewed and stable  Post vital signs: Reviewed and stable  Last Vitals:  Vitals Value Taken Time  BP 121/75 07/14/21 1315  Temp 35.9 C 07/14/21 1315  Pulse 74 07/14/21 1321  Resp 13 07/14/21 1321  SpO2 98 % 07/14/21 1321  Vitals shown include unvalidated device data.  Last Pain:  Vitals:   07/14/21 1315  PainSc: Asleep         Complications: No notable events documented.

## 2021-07-14 NOTE — Anesthesia Procedure Notes (Signed)
Anesthesia Regional Block: Interscalene brachial plexus block   Pre-Anesthetic Checklist: , timeout performed,  Correct Patient, Correct Site, Correct Laterality,  Correct Procedure, Correct Position, site marked,  Risks and benefits discussed,  Surgical consent,  Pre-op evaluation,  At surgeon's request and post-op pain management  Laterality: Right  Prep: chloraprep       Needles:  Injection technique: Single-shot  Needle Type: Echogenic Stimulator Needle     Needle Length: 10cm  Needle Gauge: 20     Additional Needles:   Procedures:, nerve stimulator,,, ultrasound used (permanent image in chart),,     Nerve Stimulator or Paresthesia:  Response: biceps flexion  Additional Responses:   Narrative:  End time: 07/14/2021 8:30 AM Injection made incrementally with aspirations every 5 mL.  Performed by: Personally  Anesthesiologist: Molli Barrows, MD  Additional Notes: Functioning IV was confirmed and monitors were applied.  Sterile prep and drape,hand hygiene and sterile gloves were used.  Negative aspiration and negative test dose prior to incremental administration of local anesthetic. The patient tolerated the procedure well.

## 2021-07-14 NOTE — Discharge Instructions (Signed)

## 2021-07-14 NOTE — Anesthesia Postprocedure Evaluation (Signed)
Anesthesia Post Note  Patient: Jared Hess  Procedure(s) Performed: SHOULDER ARTHROSCOPY WITH OPEN ROTATOR CUFF REPAIR AND DISTAL CLAVICLE ACROMINECTOMY (Right: Shoulder) BICEPS TENODESIS (Right: Shoulder)  Patient location during evaluation: PACU Anesthesia Type: General Level of consciousness: awake and alert Pain management: pain level controlled Vital Signs Assessment: post-procedure vital signs reviewed and stable Respiratory status: spontaneous breathing, nonlabored ventilation, respiratory function stable and patient connected to nasal cannula oxygen Cardiovascular status: blood pressure returned to baseline and stable Postop Assessment: no apparent nausea or vomiting Anesthetic complications: no   No notable events documented.   Last Vitals:  Vitals:   07/14/21 1345 07/14/21 1400  BP: 117/83 124/71  Pulse: 78 79  Resp: 17 16  Temp: 36.6 C (!) 36.1 C  SpO2: 95% 97%    Last Pain:  Vitals:   07/14/21 1400  PainSc: 0-No pain                 Molli Barrows

## 2021-07-14 NOTE — Op Note (Addendum)
07/14/2021  1:42 PM  PATIENT:  Jared Hess  71 y.o. male  PRE-OPERATIVE DIAGNOSIS:  Right Shoulder Rotator Cuff Tear  POST-OPERATIVE DIAGNOSIS: Right shoulder full-thickness tear of the supraspinatus, infraspinatus and subscapularis, high-grade partial-thickness tear of the biceps tendon with tendinosis, subacromial impingement and acromioclavicular joint arthrosis.  PROCEDURE: Right shoulder arthroscopic subacromial decompression, distal clavicle excision and mini open rotator cuff repair including the subscapularis with a mini open biceps tenodesis.  SURGEON:  Surgeon(s) and Role:    * Thornton Park, MD - Primary  ANESTHESIA:   general and paracervical block  PREOPERATIVE INDICATIONS:  Jared Hess is a  71 y.o. male with a diagnosis of Right Shoulder Rotator Cuff Tear confirmed by MRI who failed conservative measures and elected for surgical management.    The risks benefits and alternatives were discussed with the patient preoperatively including but not limited to the risks of infection, bleeding, nerve injury, persistent pain or weakness, failure of the hardware, re-tear of the rotator cuff and the need for further surgery. Medical risks include DVT and pulmonary embolism, myocardial infarction, stroke, pneumonia, respiratory failure and death. Patient understood these risks and wished to proceed.  OPERATIVE IMPLANTS: [place] Multifix anchors x 3 & Smith and Nephew Q Fix anchors x 2  OPERATIVE FINDINGS: Full-thickness and retracted tears of the supraspinatus infraspinatus and subscapularis with high-grade partial-thickness tear of the biceps tendon with SLAP tear.  No anterior labral tear.  Patient had extensive fraying and delaminationof the lateral edge of the rotator cuff.  OPERATIVE PROCEDURE: The patient was met in the preoperative area. The right shoulder was signed with the word yes and my initials according the hospital's correct site of surgery protocol.  Preop  history and physical was performed at the bedside.  The patient underwent placement of a right interscalene block with Exparel by the anesthesia service in the preoperative area.  The patient was then brought to the operating room where he underwent general endotracheal intubation.  The patient was placed in a beachchair position.  A spider arm positioner was used for this case. Examination under anesthesia revealed no limitation of motion or instability with load shift testing. The patient had a negative sulcus sign.  The patient was prepped and draped in a sterile fashion. A timeout was performed to verify the patient's name, date of birth, medical record number, correct site of surgery and correct procedure to be performed there was also used to verify the patient received antibiotics that all appropriate instruments, implants and radiographs studies were available in the room. Once all in attendance were in agreement case began.  Bony landmarks were drawn out with a surgical marker along with proposed arthroscopy incisions. An 11 blade was used to establish a posterior portal through which the arthroscope was placed in the glenohumeral joint. A full diagnostic examination of the shoulder was performed.  The anterior portal was established under direct visualization with an 18-gauge spinal needle. A 5.75 the medial arthroscopic cannula was placed through this anterior portal.   The intra-articular portion of the biceps tendon was found to have a high grade partial thickness tear with significant tendinosis and concomitant SLAP tear.  Therefore the decision was made to perform a biceps tenodesis. An Arthocare Perfect Pass suture placed in the biceps tendon and an arthroscopic tenotomy was performed arthroscopically using an arthroscopic scissor.  The remaining biceps tendon stump was debrided using a 90 degree Smith & Nephew werewolf wand and [employer]yon[employer] flyer shaver blade.  The perfect  pass suture was clamped  with a hemostat for later tenodesis.  The subscapularis tendon was also visualized to be torn from its attachment on the lesser tuberosity.  From the lateral portal to perfect pass sutures were placed into the subscapularis and the sutures were also clamped with a hemostat for later repair.  The arthroscope was then placed in the subacromial space. A lateral portal was then established using an 18-gauge spinal needle for localization. Extensive bursitis was encountered and debrided using a 4.0 resector shaver blade and a [place] werewolf wand from the lateral portal. A subacromial decompression was also performed using a 4.5 mm bone cutter shaver blade from the lateral portal and a distal clavicle excision was performed from the anterior portal.    There was extensive fraying and delamination of the lateral border of the rotator cuff.  The arthroscope was placed into the lateral portal for better visualization.  An anterior lateral portal was created to allow for debridement of the frayed edges of the rotator cuff until healthy tissue was visualized.  Four Smith & Nephew Perfect Pass sutures were placed in the lateral border of the rotator cuff tear. The greater tuberosity and lesser tuberosities were debrided using a 4.5 mm bone cutter shaver blade to remove all remaining torn fibers of the rotator cuff.  Debridement was performed until punctate bleeding was seen at the greater tuberosity footprint, which will allow for rotator cuff healing.  Final arthroscopic images were taken and all arthroscopic instruments were then removed and the mini-open portion of the procedure began.   A saber-type incision was made along the lateral border of the acromion. The deltoid muscle was identified and split in line with its fibers which allowed visualization of the rotator cuff.  The biceps tendon and its associated tagging suture were brought out through the deltoid split.  The Perfect Pass suture previously  placed in the lateral border of the rotator cuff were also brought out through the deltoid split.     The biceps tenodesis and repair of the subscapularis with were performed first.  A second Perfect Pass suture was placed more distal in the biceps tendon.  Approximately 52m of the intra-articular biceps was resected using a #15 blade.  The sutures from the subscapularis and the biceps tendon were loaded into a [employer]MultiFix anchor.  The anchor was placed at the top of the lesser tuberosity.  The sutures from the subscapularis were tensioned to allow for reduction of the subscapularis to the lesser tuberosity.  The sutures in the biceps were tensioned to perform a tenodesis of the biceps at the top of the into tuberous groove.    Two additional perfect pass sutures were then placed through the lateral border of the supraspinatus and infraspinatus tears. The lateral edge of the rotator cuff was further debrided until healthy, viable tissue remained.  Two Q fix anchors were then placed at the articular margin of the humeral head. The four limbs of each Q fix anchor were then passed medially through the rotator cuff with a perfect pass suture passer. These were clamped with a hemostat for later medial row fixation.  The six perfect pass sutures from the lateral border of the rotator cuff tear were then anchored to the greater tuberosity of the humeral head using two SCharlackMultifix anchors. These anchors were tensioned to allow for anatomic reduction of the rotator cuff to the greater tuberosity footprint.  The medial row repair  was then completed using an arthroscopic knot tying technique with the Q fix anchor sutures.  Once all sutures were tied down, arthroscopic images of the double row repair were taken with the arthroscope both externally and arthroscopically from the glenohumeral joint.  All incisions were copiously irrigated. The deltoid fascia was repaired using a 0 Vicryl suturean  interrupted fashion. The subcutaneous tissue of all incisions were closed with a 2-0 Vicryl. Skin closure for the arthroscopic incisions was performed with 4-0 nylon. The skin edges of the saber incision were approximated with a running 4-0 undyed Monocryl.  A dry sterile dressing was applied.  The patient was placed in an abduction sling, with a Polar Care sleeve.  All sharp and instrument counts were correct at the conclusion of the case. I was scrubbed and present for the entire case. I spoke with the patient's wife postoperatively to let her know the case had been performed without complication and the patient was stable in recovery room.  I reviewed in detail the postop instructions with her and answered all her questions.  The patient was comfortable in the recovery room and his interscalene block was working well.

## 2021-07-15 ENCOUNTER — Other Ambulatory Visit: Payer: Self-pay

## 2021-07-15 ENCOUNTER — Emergency Department
Admission: EM | Admit: 2021-07-15 | Discharge: 2021-07-15 | Disposition: A | Discharge status: Home / Self Care | Payer: Medicare Other | Attending: Emergency Medicine | Admitting: Emergency Medicine

## 2021-07-15 DIAGNOSIS — E1122 Type 2 diabetes mellitus with diabetic chronic kidney disease: Secondary | ICD-10-CM | POA: Diagnosis not present

## 2021-07-15 DIAGNOSIS — Z8546 Personal history of malignant neoplasm of prostate: Secondary | ICD-10-CM | POA: Insufficient documentation

## 2021-07-15 DIAGNOSIS — M25511 Pain in right shoulder: Secondary | ICD-10-CM | POA: Insufficient documentation

## 2021-07-15 DIAGNOSIS — G8918 Other acute postprocedural pain: Secondary | ICD-10-CM | POA: Insufficient documentation

## 2021-07-15 DIAGNOSIS — N189 Chronic kidney disease, unspecified: Secondary | ICD-10-CM | POA: Diagnosis not present

## 2021-07-15 MED ORDER — HYDROMORPHONE HCL 1 MG/ML IJ SOLN
1.0000 mg | Freq: Once | INTRAMUSCULAR | Status: AC
  Administered 2021-07-15: 1 mg via INTRAVENOUS
  Filled 2021-07-15: qty 1

## 2021-07-15 MED ORDER — MORPHINE SULFATE (PF) 4 MG/ML IV SOLN
4.0000 mg | Freq: Once | INTRAVENOUS | Status: AC
  Administered 2021-07-15: 4 mg via INTRAVENOUS
  Filled 2021-07-15: qty 1

## 2021-07-15 MED ORDER — KETOROLAC TROMETHAMINE 30 MG/ML IJ SOLN
15.0000 mg | Freq: Once | INTRAMUSCULAR | Status: AC
  Administered 2021-07-15: 15 mg via INTRAVENOUS
  Filled 2021-07-15: qty 1

## 2021-07-15 NOTE — ED Provider Notes (Signed)
[facility] Provider Note    Event Date/Time   First MD Initiated Contact with Patient 07/15/21 1809     (approximate)   History   Post-op Problem   HPI  Jared Hess is a 71 y.o. male with past medical history of diabetes, prostate cancer and CKD who presents with pain after recent rotator cuff surgery.  Patient had his rotator cuff repair yesterday by Dr. Christia Reading.  His nerve block wore off starting around 1 AM this morning and by 5 AM he was having pain.  Has been taking 5 mg of oxycodone every hour with minimal improvement in his pain.  Denies numbness tingling or weakness.    Past Medical History:  Diagnosis Date   Chronic kidney disease    Diabetes mellitus without complication ([place])    Prostate cancer ([place])    Thyroid disease     There are no problems to display for this patient.    Physical Exam  Triage Vital Signs: ED Triage Vitals  Enc Vitals Group     BP 07/15/21 1754 140/89     Pulse Rate 07/15/21 1754 (!) 103     Resp 07/15/21 1754 (!) 22     Temp 07/15/21 1754 97.9 F (36.6 C)     Temp Source 07/15/21 1754 Oral     SpO2 07/15/21 1754 97 %     Weight 07/15/21 1755 200 lb (90.7 kg)     Height 07/15/21 1755 6\' 1"  (1.854 m)     Head Circumference --      Peak Flow --      Pain Score 07/15/21 1754 10     Pain Loc --      Pain Edu? --      Excl. in [place]? --     Most recent vital signs: Vitals:   07/15/21 1908 07/15/21 2031  BP: (!) 155/75 (!) 148/78  Pulse:  98  Resp:  16  Temp:  98.6 F (37 C)  SpO2:  97%     General: Awake, patient appears uncomfortable CV:  Good peripheral perfusion.  Resp:  Normal effort.  Abd:  No distention.  Neuro:             Awake, Alert, Oriented x 3  Other:  Incision over the right anterior shoulder, clean dry and intact, no significant swelling 2+ radial pulse   ED Results / Procedures / Treatments  Labs (all labs ordered are listed, but only abnormal results are displayed) Labs  Reviewed - No data to display   EKG     RADIOLOGY    PROCEDURES: MEDICATIONS ORDERED IN ED: Medications  morphine (PF) 4 MG/ML injection 4 mg (4 mg Intravenous Given 07/15/21 1844)  HYDROmorphone (DILAUDID) injection 1 mg (1 mg Intravenous Given 07/15/21 1923)  ketorolac (TORADOL) 30 MG/ML injection 15 mg (15 mg Intravenous Given 07/15/21 1925)     IMPRESSION / MDM / ASSESSMENT AND PLAN / ED COURSE  I reviewed the triage vital signs and the nursing notes.                              70 year old male presents with postoperative pain after rotator cuff surgery yesterday.  He started having pain around 5 AM when his nerve block wore off.  Has been taking 5 mg of oxycodone almost every hour with minimal relief.  He called his orthopedist who told him to come to the emergency  department.  Patient does look uncomfortable but is neurovascular intact incisions clean dry and intact no significant swelling compartments are soft.  Discussed with Dr. Harlow Mares who recommends IV pain medicine to get him somewhat more under control and then he will prescribe him a higher dose of oxycodone for home.  Still with significant pain after 4 mg of morphine.  Given a milligram of Dilaudid and then 15 mg of Toradol.  Pain now 4 out of 10.  Given his pain is much more controlled I think he is appropriate for discharge.      FINAL CLINICAL IMPRESSION(S) / ED DIAGNOSES   Final diagnoses:  Post-operative pain     Rx / DC Orders   ED Discharge Orders     None        Note:  This document was prepared using Dragon voice recognition software and may include unintentional dictation errors.   Rada Hay, MD 07/15/21 2109

## 2021-07-15 NOTE — ED Triage Notes (Signed)
Pt had right shoulder surg yesterday here with dr Mack Guise, pt had a nerve block that wore off around 0100 and was supposed to last for a day to 3. Pt is having severe pain despite taking the prescribed pain medication and was told by dr Mack Guise to come to the ER

## 2021-07-15 NOTE — ED Notes (Signed)
Pt states the he was told by surgery to come to ED for IV pain medications because his at home PO meds were not working to decreased his pain after rotator cuff surgery yesterday.  Pt took 2 of his oxycodone at 2pm without relief , pt also took some tylenol without relief.

## 2021-07-15 NOTE — Discharge Instructions (Signed)
Please take ibuprofen 400 mg every 6 hours.  Please also take 1 mg of acetaminophen every 8 hours.  You should be taking 10 mg of oxycodone every 4 hours as needed.

## 2021-07-20 ENCOUNTER — Other Ambulatory Visit: Payer: Self-pay | Admitting: Neurology

## 2021-07-20 DIAGNOSIS — G3184 Mild cognitive impairment, so stated: Secondary | ICD-10-CM

## 2021-07-30 ENCOUNTER — Ambulatory Visit
Admission: RE | Admit: 2021-07-30 | Discharge: 2021-07-30 | Disposition: A | Discharge status: Home / Self Care | Payer: Medicare Other | Source: Ambulatory Visit | Attending: Neurology | Admitting: Neurology

## 2021-07-30 ENCOUNTER — Other Ambulatory Visit: Payer: Self-pay

## 2021-07-30 DIAGNOSIS — G3184 Mild cognitive impairment, so stated: Secondary | ICD-10-CM | POA: Diagnosis not present

## 2021-07-30 IMAGING — MR MR HEAD WO/W CM
15 series · 48 of 48 positions shown · IV contrast (gadavist)
Comparison: Head MRI [DATE]

CLINICAL DATA: Mild cognitive impairment.

EXAM:
MRI HEAD WITHOUT AND WITH CONTRAST
TECHNIQUE: Multiplanar, multiecho pulse sequences of the brain and surrounding
structures were obtained without and with intravenous contrast.
CONTRAST:  9mL GADAVIST GADOBUTROL 1 MMOL/ML IV SOLN

[Series 5: ax dwi_tracew · axial · 3.0mm · 0.65mm/px · z∈[-102,+53]mm · 4 of 48 slices shown]
[im 1/48]
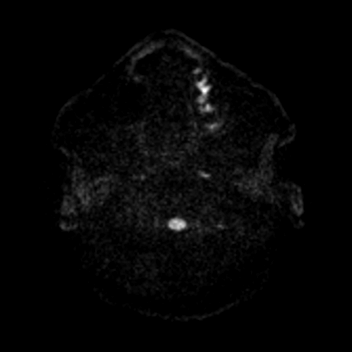
[im 16/48]
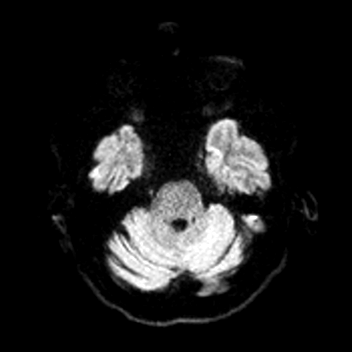
[im 32/48]
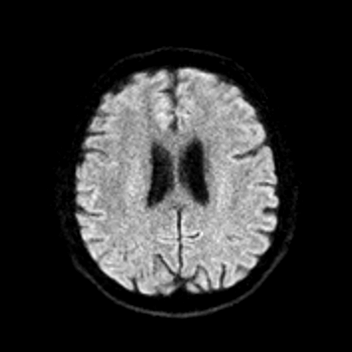
[im 48/48]
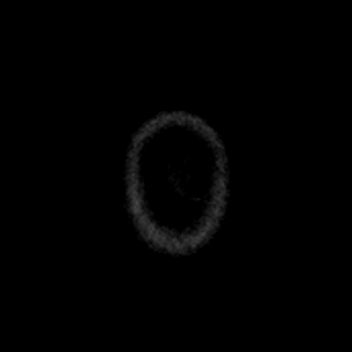

[Series 6: ax dwi_adc · axial · 3.0mm · 0.65mm/px · z∈[-102,+53]mm · 3 of 48 slices shown]
[im 1/48]
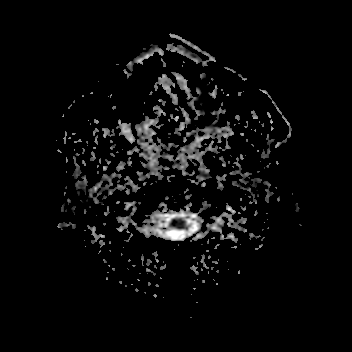
[im 24/48]
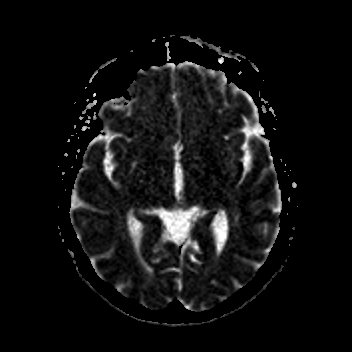
[im 48/48]
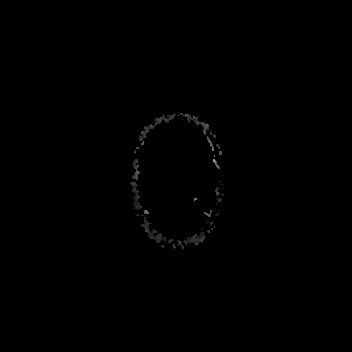

[Series 7: cor dwi_tracew · coronal · 5.0mm · 0.68mm/px · 2 of 40 slices shown]
[im 1/40]
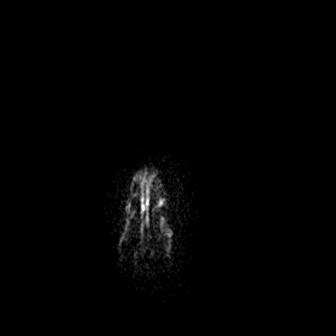
[im 40/40]
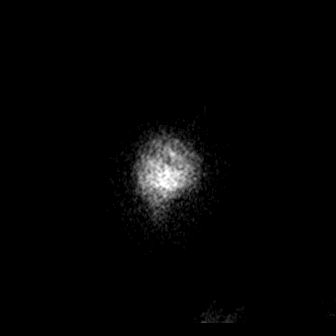

[Series 8: cor dwi_adc · coronal · 5.0mm · 0.68mm/px · 2 of 40 slices shown]
[im 1/40]
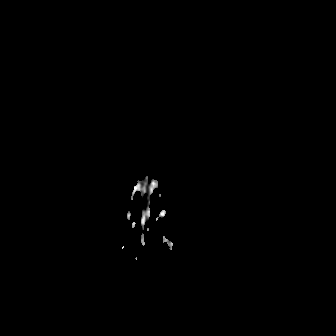
[im 40/40]
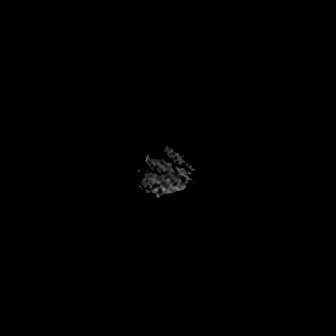

[Series 9: T1 · sagittal · 5.0mm · 0.62mm/px · 1 of 23 slices shown (1 of 2)]
[im 1/23]
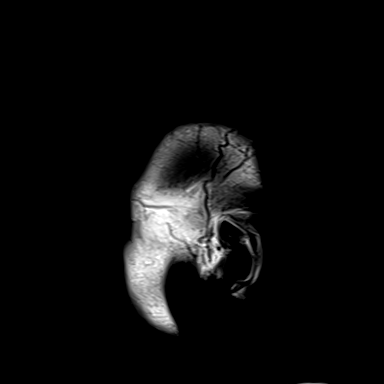

[Series 10: T2 · axial · 5.0mm · 0.53mm/px · 1 of 27 slices shown]
[im 1/27]
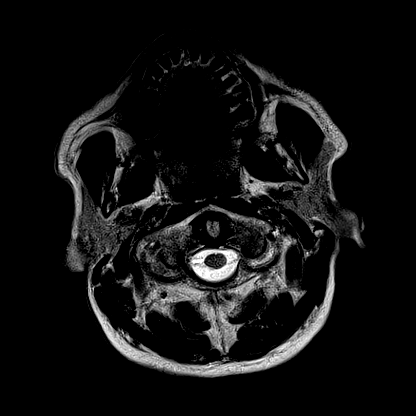

[Series 11: mag_images · axial · 3.0mm · 0.90mm/px · z∈[-111,+66]mm · 3 of 60 slices shown]
[im 1/60]
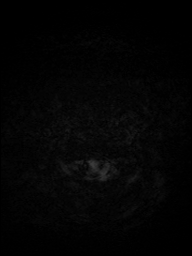
[im 30/60]
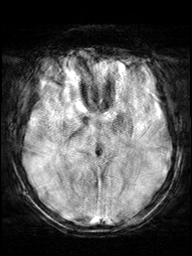
[im 60/60]
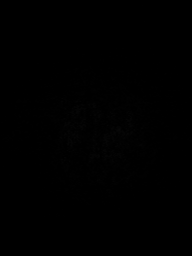

[Series 12: pha_images · axial · 3.0mm · 0.90mm/px · z∈[-111,+51]mm · 3 of 53 slices shown]
[im 1/53]
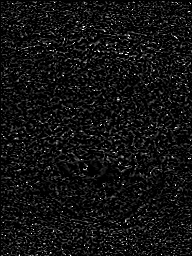
[im 27/53]
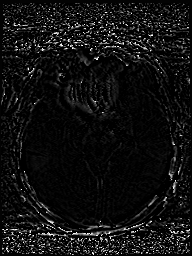
[im 53/53]
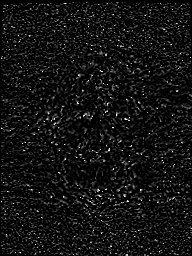

[Series 13: swi_images · axial · 3.0mm · 0.90mm/px · z∈[-111,+66]mm · 3 of 60 slices shown]
[im 1/60]
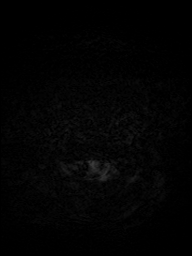
[im 30/60]
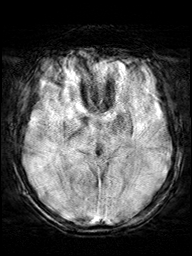
[im 60/60]
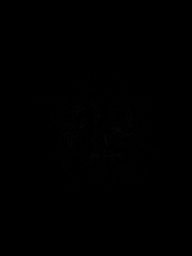

[Series 15: FLAIR · axial · 3.0mm · 0.53mm/px · z∈[-103,+58]mm · 3 of 55 slices shown]
[im 1/55]
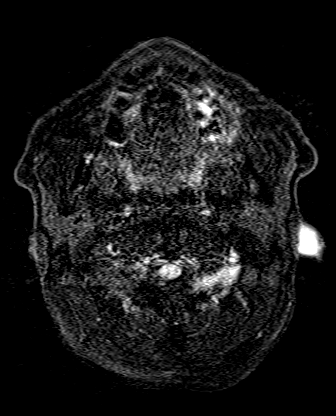
[im 28/55]
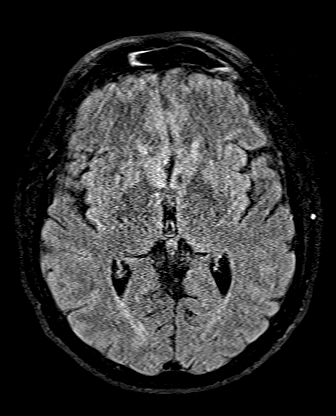
[im 55/55]
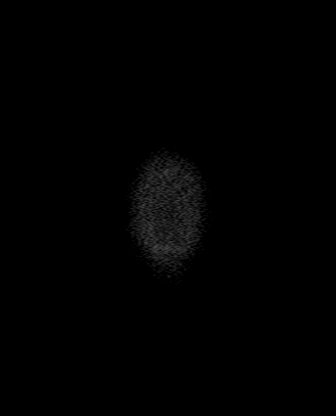

[Series 16: T2-star · axial · 5.0mm · 0.45mm/px · 1 of 27 slices shown]
[im 1/27]
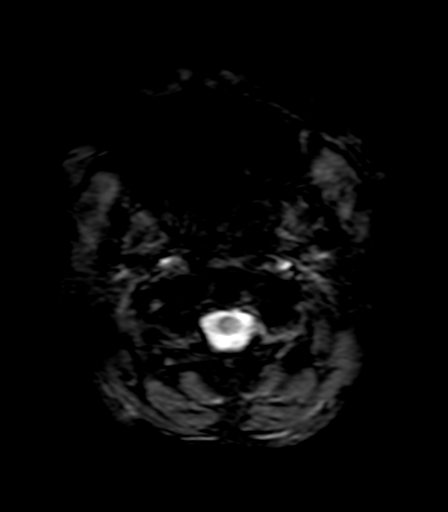

[Series 17: T1 · axial · 1.0mm · 0.98mm/px · z∈[-104,+70]mm · 9 of 172 slices shown (2 of 2)]
[im 1/172]
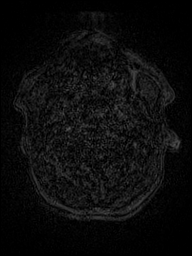
[im 22/172]
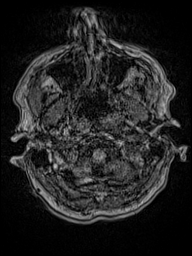
[im 43/172]
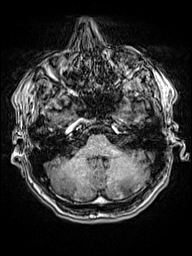
[im 65/172]
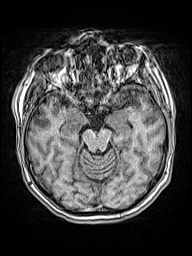
[im 86/172]
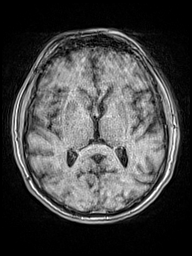
[im 107/172]
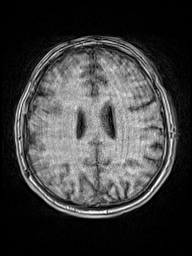
[im 129/172]
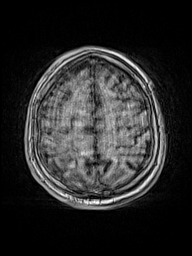
[im 150/172]
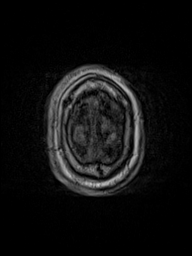
[im 172/172]
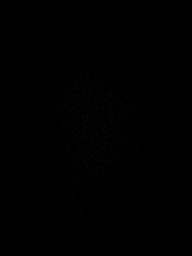

[Series 18: T2 post-contrast · coronal · 5.0mm · 0.57mm/px · 2 of 29 slices shown]
[im 1/29]
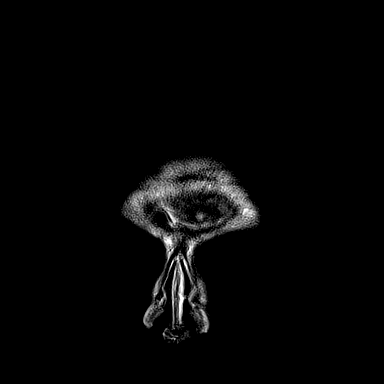
[im 29/29]
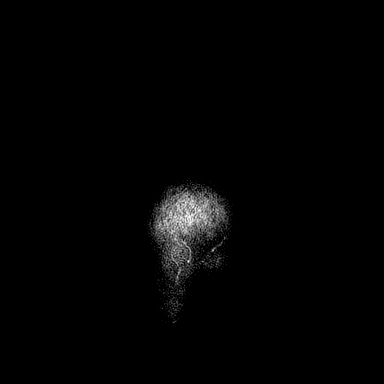

[Series 19: T1 post-contrast · coronal · 5.0mm · 0.57mm/px · 2 of 29 slices shown (1 of 2)]
[im 1/29]
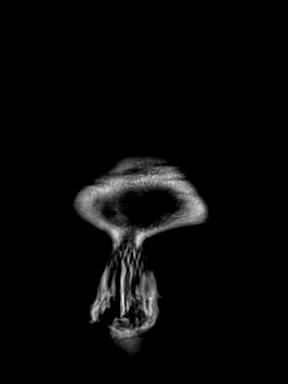
[im 29/29]
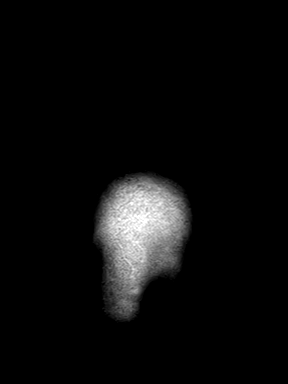

[Series 20: T1 post-contrast · axial · 1.0mm · 0.98mm/px · z∈[-104,+70]mm · 9 of 174 slices shown (2 of 2)]
[im 1/174]
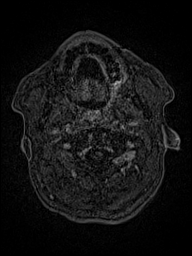
[im 22/174]
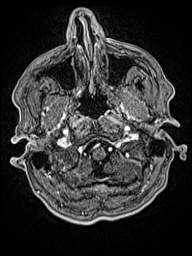
[im 44/174]
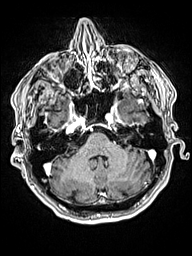
[im 65/174]
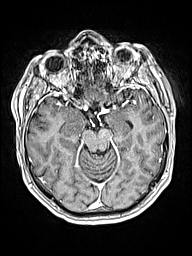
[im 87/174]
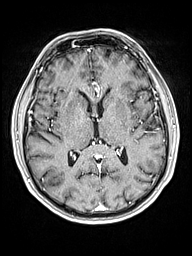
[im 109/174]
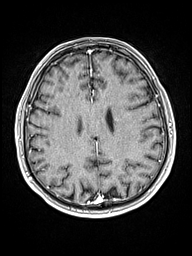
[im 130/174]
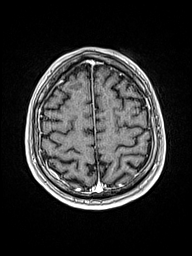
[im 152/174]
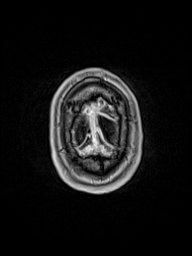
[im 174/174]
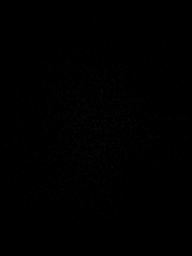

[48 of 48 positions shown; findings below may reference images not displayed]

FINDINGS: Multiple sequences are moderately motion degraded.

Brain: There is no evidence of an acute infarct, intracranial
hemorrhage, mass, midline shift, or extra-axial fluid collection.
The ventricles and sulci are normal without evidence of age advanced
or lobar predominant atrophy. No significant white matter disease is
seen. No abnormal enhancement is identified.

Vascular: Major intracranial vascular flow voids are preserved.

Skull and upper cervical spine: Unremarkable bone marrow signal.

Sinuses/Orbits: Unremarkable orbits. Mild left frontal and ethmoid
sinus mucosal thickening. Clear mastoid air cells.

Other: None.
IMPRESSION: Unremarkable appearance of the brain for age.

## 2021-07-30 MED ORDER — GADOBUTROL 1 MMOL/ML IV SOLN
9.0000 mL | Freq: Once | INTRAVENOUS | Status: AC | PRN
  Administered 2021-07-30: 9 mL via INTRAVENOUS

## 2021-08-21 ENCOUNTER — Encounter: Payer: Self-pay | Admitting: Orthopedic Surgery

## 2021-09-14 DIAGNOSIS — R7989 Other specified abnormal findings of blood chemistry: Secondary | ICD-10-CM | POA: Insufficient documentation

## 2021-10-29 DIAGNOSIS — E109 Type 1 diabetes mellitus without complications: Secondary | ICD-10-CM | POA: Diagnosis present

## 2022-03-09 ENCOUNTER — Other Ambulatory Visit: Payer: Self-pay | Admitting: Specialist

## 2022-03-09 ENCOUNTER — Other Ambulatory Visit (HOSPITAL_COMMUNITY): Payer: Self-pay | Admitting: Specialist

## 2022-03-09 DIAGNOSIS — R2689 Other abnormalities of gait and mobility: Secondary | ICD-10-CM

## 2022-03-09 DIAGNOSIS — R42 Dizziness and giddiness: Secondary | ICD-10-CM

## 2022-03-16 ENCOUNTER — Ambulatory Visit: Payer: Medicare Other

## 2022-03-17 ENCOUNTER — Ambulatory Visit
Admission: RE | Admit: 2022-03-17 | Discharge: 2022-03-17 | Disposition: A | Discharge status: Home / Self Care | Payer: Medicare Other | Source: Ambulatory Visit | Attending: Specialist | Admitting: Specialist

## 2022-03-17 DIAGNOSIS — R2689 Other abnormalities of gait and mobility: Secondary | ICD-10-CM | POA: Diagnosis present

## 2022-03-17 DIAGNOSIS — R42 Dizziness and giddiness: Secondary | ICD-10-CM | POA: Diagnosis present

## 2022-03-17 MED ORDER — GADOBUTROL 1 MMOL/ML IV SOLN
9.0000 mL | Freq: Once | INTRAVENOUS | Status: AC | PRN
  Administered 2022-03-17: 9 mL via INTRAVENOUS

## 2022-04-24 ENCOUNTER — Ambulatory Visit
Admission: EM | Admit: 2022-04-24 | Discharge: 2022-04-24 | Disposition: A | Discharge status: Home / Self Care | Payer: Medicare Other

## 2022-04-24 ENCOUNTER — Encounter: Payer: Self-pay | Admitting: Emergency Medicine

## 2022-04-24 DIAGNOSIS — S0990XA Unspecified injury of head, initial encounter: Secondary | ICD-10-CM | POA: Diagnosis not present

## 2022-04-24 DIAGNOSIS — W19XXXA Unspecified fall, initial encounter: Secondary | ICD-10-CM | POA: Diagnosis not present

## 2022-04-24 DIAGNOSIS — S3992XA Unspecified injury of lower back, initial encounter: Secondary | ICD-10-CM | POA: Diagnosis not present

## 2022-04-24 DIAGNOSIS — R0781 Pleurodynia: Secondary | ICD-10-CM | POA: Diagnosis not present

## 2022-04-24 LAB — GLUCOSE, CAPILLARY: Glucose-Capillary: 132 mg/dL — ABNORMAL HIGH (ref 70–99)

## 2022-04-24 NOTE — ED Notes (Signed)
Patient is being discharged from the Urgent Care and sent to the [facility] Emergency Department via private vehicle with wife . Per Laurene Footman, PA, patient is in need of higher level of care due to head injury and severe rib and lower back pain due to fall with LOC. Patient and wife is aware and verbalizes understanding of plan of care.  Vitals:   04/24/22 1122  BP: (!) 155/89  Pulse: 74  Resp: 16  Temp: 97.7 F (36.5 C)  SpO2: 100%

## 2022-04-24 NOTE — ED Triage Notes (Addendum)
Patient states that he was unloading his trailer when the snap broke and fell off his trailer and hit his head, left rib cage and back.  Patient c/o pain in his lower back and head.  Patient thinks he might have LOC. Patient not sure what he hit his head on whether it was the bumper or concrete.  Patient states that this happened about 1 hour ago. Patient reports pounding headache.

## 2022-04-24 NOTE — ED Provider Notes (Signed)
[facility]    CSN: [id number] Arrival date & time: 04/24/22  1107      History   Chief Complaint Chief Complaint  Patient presents with   Back Pain   Fall    HPI Jared Hess is a 71 y.o. male presenting with his wife for multiple injuries following an accident that occurred about an hour and a half ago.  Patient says that he was unloading a trailer and pulling on a strap when it broke and he fell off the trailer and hit his side on a rock.  He says he thinks he passed out because he saw darkness and stars.  He says he thinks it was going for couple of seconds.  He does have posterior headache where he hit his head as well as some neck pain, significant lower back pain and left-sided rib pain.  Patient reports when he takes of breath or coughs he has a lot of pain in his ribs.  He says when he moves his back is extremely painful.  He does have a history of previous back surgery.  He has not had any dizziness, vomiting, confusion, balance issues or speech problems.  He is not taking any anticoagulant medications.  His medical history significant for type 1 diabetes, chronic kidney disease, prostate cancer, thyroid disease.  HPI  Past Medical History:  Diagnosis Date   Chronic kidney disease    Diabetes mellitus without complication ([place])    Prostate cancer ([place])    Thyroid disease     There are no problems to display for this patient.   Past Surgical History:  Procedure Laterality Date   BICEPT TENODESIS Right 07/14/2021   Procedure: BICEPS TENODESIS;  Surgeon: Thornton Park, MD;  Location: [facility];  Service: Orthopedics;  Laterality: Right;   PROSTATE CRYOABLATION  2010   SHOULDER ARTHROSCOPY WITH OPEN ROTATOR CUFF REPAIR AND DISTAL CLAVICLE ACROMINECTOMY Right 07/14/2021   Procedure: SHOULDER ARTHROSCOPY WITH OPEN ROTATOR CUFF REPAIR AND DISTAL CLAVICLE ACROMINECTOMY;  Surgeon: Thornton Park, MD;  Location: [facility];  Service: Orthopedics;  Laterality:  Right;   shoulder blade surgery  2002       Home Medications    Prior to Admission medications   Medication Sig Start Date End Date Taking? Authorizing Provider  insulin degludec (TRESIBA) 100 UNIT/ML FlexTouch Pen Inject into the skin. 03/05/22  Yes [provider]  levothyroxine (SYNTHROID) 112 MCG tablet Take 112 mcg by mouth daily before breakfast. 01/30/19  Yes [provider]  NOVOLOG FLEXPEN 100 UNIT/ML FlexPen Inject 4 Units into the skin 3 (three) times daily before meals. 05/15/21  Yes [provider]  TRESIBA FLEXTOUCH 100 UNIT/ML FlexTouch Pen Inject 17 Units into the skin every morning. 04/26/21  Yes [provider]  liothyronine (CYTOMEL) 5 MCG tablet Take 5 mcg by mouth daily. 01/13/21   [provider]  ondansetron (ZOFRAN) 4 MG tablet Take 1 tablet (4 mg total) by mouth every 8 (eight) hours as needed for nausea or vomiting. 07/14/21   Thornton Park, MD  oxyCODONE (OXY IR/ROXICODONE) 5 MG immediate release tablet Take 1-2 tablets (5-10 mg total) by mouth every 4 (four) hours as needed. 07/14/21   Thornton Park, MD    Family History Family History  Problem Relation Age of Onset   Prostate cancer Neg Hx    Bladder Cancer Neg Hx    Kidney cancer Neg Hx     Social History Social History   Tobacco Use   Smoking  status: Never   Smokeless tobacco: Never  Vaping Use   Vaping Use: Never used  Substance Use Topics   Alcohol use: Yes   Drug use: No     Allergies   Patient has no known allergies.   Review of Systems Review of Systems  Constitutional:  Negative for fatigue.  Eyes:  Negative for visual disturbance.  Respiratory:  Negative for cough and shortness of breath.   Cardiovascular:  Positive for chest pain (left rib pain).  Gastrointestinal:  Negative for abdominal pain, nausea and vomiting.  Musculoskeletal:  Positive for back pain and neck pain. Negative for gait problem.  Skin:  Negative for color change  and wound.  Neurological:  Positive for syncope and headaches. Negative for dizziness, speech difficulty, weakness and numbness.  Psychiatric/Behavioral:  Negative for confusion.      Physical Exam Triage Vital Signs ED Triage Vitals  Enc Vitals Group     BP 04/24/22 1122 (!) 155/89     Pulse Rate 04/24/22 1122 74     Resp 04/24/22 1122 16     Temp 04/24/22 1122 97.7 F (36.5 C)     Temp Source 04/24/22 1122 Oral     SpO2 04/24/22 1122 100 %     Weight 04/24/22 1119 199 lb 15.3 oz (90.7 kg)     Height 04/24/22 1119 '6\' 1"'$  (1.854 m)     Head Circumference --      Peak Flow --      Pain Score 04/24/22 1119 10     Pain Loc --      Pain Edu? --      Excl. in [place]? --    No data found.  Updated Vital Signs BP (!) 155/89 (BP Location: Right Arm)   Pulse 74   Temp 97.7 F (36.5 C) (Oral)   Resp 16   Ht '6\' 1"'$  (1.854 m)   Wt 199 lb 15.3 oz (90.7 kg)   SpO2 100%   BMI 26.38 kg/m    Physical Exam Vitals (due to pain.  Patient is sitting with his back completely straight, shaking his right leg and wincing in pain) and nursing note reviewed.  Constitutional:      General: He is in acute distress.     Appearance: Normal appearance. He is well-developed.  HENT:     Head: No raccoon eyes, Battle's sign or contusion.     Comments: Mild swelling/hematoma occiput. TTP.     Right Ear: Tympanic membrane, ear canal and external ear normal.     Left Ear: Tympanic membrane, ear canal and external ear normal.     Nose: Nose normal.     Mouth/Throat:     Mouth: Mucous membranes are moist.     Pharynx: Oropharynx is clear.  Eyes:     General: No scleral icterus.    Extraocular Movements: Extraocular movements intact.     Conjunctiva/sclera: Conjunctivae normal.     Pupils: Pupils are equal, round, and reactive to light.  Cardiovascular:     Rate and Rhythm: Normal rate and regular rhythm.  Pulmonary:     Effort: Pulmonary effort is normal. No respiratory distress.     Breath sounds:  Normal breath sounds.  Chest:     Chest wall: Tenderness (TTP diffusely throughout lateral left ribs) present.  Musculoskeletal:     Cervical back: Normal range of motion and neck supple. Tenderness (mild TTP bilateral paracervical muscles. No spinal TTP) present.     Lumbar back: Bony  tenderness (L3-S1) present. No swelling.     Comments: Midline scar lumbar region. TTP of back is directly on the vertebra  Skin:    General: Skin is warm and dry.     Capillary Refill: Capillary refill takes less than 2 seconds.  Neurological:     General: No focal deficit present.     Mental Status: He is alert and oriented to person, place, and time. Mental status is at baseline.     Cranial Nerves: No cranial nerve deficit.     Motor: No weakness.     Coordination: Coordination normal.     Gait: Gait normal.      UC Treatments / Results  Labs (all labs ordered are listed, but only abnormal results are displayed) Labs Reviewed  GLUCOSE, CAPILLARY - Abnormal; Notable for the following components:      Result Value   Glucose-Capillary 132 (*)    All other components within normal limits  CBG MONITORING, ED    EKG   Radiology No results found.  Procedures Procedures (including critical care time)  Medications Ordered in UC Medications - No data to display  Initial Impression / Assessment and Plan / UC Course  I have reviewed the triage vital signs and the nursing notes.  Pertinent labs & imaging results that were available during my care of the patient were reviewed by me and considered in my medical decision making (see chart for details).   71 year old male brought in by his wife for injuries after falling off the back of a nonmoving trailer while pulling on a strap.  Reporting head injury and possible loss of consciousness, mild neck pain, headache, left-sided rib pain and lumbar back pain.  No shortness of breath.  BP elevated 155/89.  Other vitals normal and stable.  Patient is  in some distress due to the pain.  He is looking very uncomfortable while sitting on the exam bed.  He is sitting in his back completely straight and is shaking his right leg and wincing in pain.  He does have a little bit of swelling of the occipital region consistent with a hematoma and tenderness in this area.  He has normal cranial nerve exam.  He has some tenderness throughout the paraspinal muscles of the neck but no spinal tenderness.  He does however have spinal tenderness of the lumbar region where his midline scar is.  Additionally he has tenderness throughout the left ribs.  His chest is clear to auscultation and for listening fields.  Fingerstick glucose is 132.  I have concerns about his possible loss of consciousness related to the head injury as well as possible fracture of his back and ribs.  I advised that he be seen in the emergency department to have head CT and likely CT of spine.  Advised EMS transport but they declined.  His wife says that she will take him to [place] at this time.  He is leaving in stable condition.   Final Clinical Impressions(s) / UC Diagnoses   Final diagnoses:  Injury of head, initial encounter  Injury of back, initial encounter  Fall, initial encounter  Rib pain on left side     Discharge Instructions      -I have concerns about a head injury and you need a CT scan.  Also need imaging of your ribs and back.  I am afraid something could be fractured.  Please go to the ER at this time.  He also need adequate  pain control.  You have been advised to follow up immediately in the emergency department for concerning signs.symptoms. If you declined EMS transport, please have a family member take you directly to the ED at this time. Do not delay. Based on concerns about condition, if you do not follow up in th e ED, you may risk poor outcomes including worsening of condition, delayed treatment and potentially life threatening issues. If you have  declined to go to the ED at this time, you should call your PCP immediately to set up a follow up appointment.  Go to ED for red flag symptoms, including; fevers you cannot reduce with Tylenol/Motrin, severe headaches, vision changes, numbness/weakness in part of the body, lethargy, confusion, intractable vomiting, severe dehydration, chest pain, breathing difficulty, severe persistent abdominal or pelvic pain, signs of severe infection (increased redness, swelling of an area), feeling faint or passing out, dizziness, etc. You should especially go to the ED for sudden acute worsening of condition if you do not elect to go at this time.     ED Prescriptions   None    PDMP not reviewed this encounter.   Danton Clap, PA-C 04/24/22 1205

## 2022-04-24 NOTE — Discharge Instructions (Signed)
-  I have concerns about a head injury and you need a CT scan.  Also need imaging of your ribs and back.  I am afraid something could be fractured.  Please go to the ER at this time.  He also need adequate pain control.  You have been advised to follow up immediately in the emergency department for concerning signs.symptoms. If you declined EMS transport, please have a family member take you directly to the ED at this time. Do not delay. Based on concerns about condition, if you do not follow up in th e ED, you may risk poor outcomes including worsening of condition, delayed treatment and potentially life threatening issues. If you have declined to go to the ED at this time, you should call your PCP immediately to set up a follow up appointment.  Go to ED for red flag symptoms, including; fevers you cannot reduce with Tylenol/Motrin, severe headaches, vision changes, numbness/weakness in part of the body, lethargy, confusion, intractable vomiting, severe dehydration, chest pain, breathing difficulty, severe persistent abdominal or pelvic pain, signs of severe infection (increased redness, swelling of an area), feeling faint or passing out, dizziness, etc. You should especially go to the ED for sudden acute worsening of condition if you do not elect to go at this time.

## 2022-05-05 ENCOUNTER — Ambulatory Visit
Admission: EM | Admit: 2022-05-05 | Discharge: 2022-05-05 | Disposition: A | Discharge status: Home / Self Care | Payer: Medicare Other | Attending: Emergency Medicine | Admitting: Emergency Medicine

## 2022-05-05 DIAGNOSIS — R110 Nausea: Secondary | ICD-10-CM | POA: Diagnosis not present

## 2022-05-05 DIAGNOSIS — M545 Low back pain, unspecified: Secondary | ICD-10-CM | POA: Diagnosis not present

## 2022-05-05 MED ORDER — ONDANSETRON 4 MG PO TBDP: 4.0000 mg | ORAL_TABLET | Freq: Three times a day (TID) | ORAL | 0 refills | Status: DC | PRN

## 2022-05-05 MED ORDER — CYCLOBENZAPRINE HCL 10 MG PO TABS: 10.0000 mg | ORAL_TABLET | Freq: Two times a day (BID) | ORAL | 0 refills | Status: DC | PRN

## 2022-05-05 MED ORDER — TRAMADOL HCL 50 MG PO TABS: 50.0000 mg | ORAL_TABLET | Freq: Three times a day (TID) | ORAL | 0 refills | Status: DC | PRN

## 2022-05-05 MED ORDER — ONDANSETRON 4 MG PO TBDP
4.0000 mg | ORAL_TABLET | Freq: Once | ORAL | Status: AC
  Administered 2022-05-05: 4 mg via ORAL

## 2022-05-05 MED ORDER — PREDNISONE 20 MG PO TABS: 40.0000 mg | ORAL_TABLET | Freq: Every day | ORAL | 0 refills | Status: DC

## 2022-05-05 MED ORDER — METHYLPREDNISOLONE SODIUM SUCC 40 MG IJ SOLR
60.0000 mg | Freq: Once | INTRAMUSCULAR | Status: AC
  Administered 2022-05-05: 60 mg via INTRAMUSCULAR

## 2022-05-05 NOTE — Discharge Instructions (Addendum)
Your pain is most likely caused by recent fracture  You have been given an injection of steroids today here in the office to help minimize your pain, starting tomorrow begin prednisone tablets every morning for 5 days, this medicine will temporarily increase your blood sugar, monitor closely and make adjustments with your insulin as needed, will return to baseline once medication is complete  You may use muscle relaxer twice daily as needed, be mindful this medication will make you drowsy  You may use tramadol every 8 hours as needed for severe pain,  Be mindful this medication can make you drowsy  You may use Zofran every 8 hours as needed to manage her nausea, you have been given an injection of this medication in office, increase your fluid intake until you are able to tolerate foods  You may use heating pad in 15 minute intervals as needed for additional comfort, within the first 2-3 days you may find comfort in using ice in 10-15 minutes over affected area  Begin stretching affected area daily for 10 minutes as tolerated to further loosen muscles   When lying down place pillow underneath and between knees for support  Can try sleeping without pillow on firm mattress   Practice good posture: head back, shoulders back, chest forward, pelvis back and weight distributed evenly on both legs  If pain persist after recommended treatment or reoccurs if may be beneficial to follow up with orthopedic specialist for evaluation, this doctor specializes in the bones and can manage your symptoms long-term with options such as but not limited to imaging, medications or physical therapy

## 2022-05-05 NOTE — ED Provider Notes (Signed)
[facility]    CSN: [id number] Arrival date & time: 05/05/22  5366      History   Chief Complaint Chief Complaint  Patient presents with   Fall    HPI Jared Hess is a 71 y.o. male.   Patient presents for evaluation of centralized and right-sided lumbar back pain that began after fall on 04/24/2022, initially was evaluated in the emergency department, diagnosed with fracture and trauma pneumothorax.  CT completed during this timeframe, negative.  Patient has tinnitus at baseline, no current medications, has tried Klonopin in the past which was unsuccessful.  Believes pain is flaring tinnitus which is causing nausea.  Has been unable to tolerate food or liquids over the last few days due to extent of nausea.  Has numbness and tingling to the right lower aspect of the back, pain does not radiate.  Denies urinary or bowel incontinence.  Has attempted use of 1000 mg of Tylenol but ran out of medication 1 day ago.  History of diabetes, controlled.    Past Medical History:  Diagnosis Date   Chronic kidney disease    Diabetes mellitus without complication ([place])    Prostate cancer ([place])    Thyroid disease     There are no problems to display for this patient.   Past Surgical History:  Procedure Laterality Date   BICEPT TENODESIS Right 07/14/2021   Procedure: BICEPS TENODESIS;  Surgeon: Thornton Park, MD;  Location: [facility];  Service: Orthopedics;  Laterality: Right;   PROSTATE CRYOABLATION  2010   SHOULDER ARTHROSCOPY WITH OPEN ROTATOR CUFF REPAIR AND DISTAL CLAVICLE ACROMINECTOMY Right 07/14/2021   Procedure: SHOULDER ARTHROSCOPY WITH OPEN ROTATOR CUFF REPAIR AND DISTAL CLAVICLE ACROMINECTOMY;  Surgeon: Thornton Park, MD;  Location: [facility];  Service: Orthopedics;  Laterality: Right;   shoulder blade surgery  2002       Home Medications    Prior to Admission medications   Medication Sig Start Date End Date Taking? Authorizing Provider  insulin  degludec (TRESIBA) 100 UNIT/ML FlexTouch Pen Inject into the skin. 03/05/22   [provider]  levothyroxine (SYNTHROID) 112 MCG tablet Take 112 mcg by mouth daily before breakfast. 01/30/19   [provider]  liothyronine (CYTOMEL) 5 MCG tablet Take 5 mcg by mouth daily. 01/13/21   [provider]  NOVOLOG FLEXPEN 100 UNIT/ML FlexPen Inject 4 Units into the skin 3 (three) times daily before meals. 05/15/21   [provider]  ondansetron (ZOFRAN) 4 MG tablet Take 1 tablet (4 mg total) by mouth every 8 (eight) hours as needed for nausea or vomiting. 07/14/21   Thornton Park, MD  oxyCODONE (OXY IR/ROXICODONE) 5 MG immediate release tablet Take 1-2 tablets (5-10 mg total) by mouth every 4 (four) hours as needed. 07/14/21   Thornton Park, MD  TRESIBA FLEXTOUCH 100 UNIT/ML FlexTouch Pen Inject 17 Units into the skin every morning. 04/26/21   [provider]    Family History Family History  Problem Relation Age of Onset   Prostate cancer Neg Hx    Bladder Cancer Neg Hx    Kidney cancer Neg Hx     Social History Social History   Tobacco Use   Smoking status: Never   Smokeless tobacco: Never  Vaping Use   Vaping Use: Never used  Substance Use Topics   Alcohol use: Yes   Drug use: No     Allergies   Patient has no known allergies.   Review of Systems Review of Systems  Constitutional: Negative.   Respiratory: Negative.    Cardiovascular: Negative.   Musculoskeletal:  Positive for back pain. Negative for arthralgias, gait problem, joint swelling, myalgias, neck pain and neck stiffness.  Skin: Negative.   Neurological: Negative.      Physical Exam Triage Vital Signs ED Triage Vitals  Enc Vitals Group     BP 05/05/22 1017 128/84     Pulse Rate 05/05/22 1017 81     Resp 05/05/22 1017 18     Temp 05/05/22 1017 (!) 97.4 F (36.3 C)     Temp Source 05/05/22 1017 Oral     SpO2 05/05/22 1017 96 %     Weight --      Height --       Head Circumference --      Peak Flow --      Pain Score 05/05/22 1014 10     Pain Loc --      Pain Edu? --      Excl. in [place]? --    No data found.  Updated Vital Signs BP 128/84 (BP Location: Left Arm)   Pulse 81   Temp (!) 97.4 F (36.3 C) (Oral)   Resp 18   SpO2 96%   Visual Acuity Right Eye Distance:   Left Eye Distance:   Bilateral Distance:    Right Eye Near:   Left Eye Near:    Bilateral Near:     Physical Exam Constitutional:      Appearance: Normal appearance.  HENT:     Head: Normocephalic.     Right Ear: Tympanic membrane, ear canal and external ear normal.     Left Ear: Tympanic membrane, ear canal and external ear normal.  Eyes:     Extraocular Movements: Extraocular movements intact.  Pulmonary:     Effort: Pulmonary effort is normal.  Musculoskeletal:     Comments: Lumbar spinal tenderness without ecchymosis, swelling or deformity, able to sit erect but visibly uncomfortable, positive straight leg test, limited range of motion  Neurological:     Mental Status: He is alert and oriented to person, place, and time. Mental status is at baseline.  Psychiatric:        Mood and Affect: Mood normal.        Behavior: Behavior normal.      UC Treatments / Results  Labs (all labs ordered are listed, but only abnormal results are displayed) Labs Reviewed - No data to display  EKG   Radiology No results found.  Procedures Procedures (including critical care time)  Medications Ordered in UC Medications - No data to display  Initial Impression / Assessment and Plan / UC Course  I have reviewed the triage vital signs and the nursing notes.  Pertinent labs & imaging results that were available during my care of the patient were reviewed by me and considered in my medical decision making (see chart for details).  Lumbar back pain, nausea without vomiting  Etiology is most likely related to fracture which is exacerbating baseline tinnitus, discussed  with patient and spouse, will defer additional imaging today, methylprednisolone injection given in office and prednisone and Flexeril prescribed for outpatient treatment, discussed effects on blood sugar and advised to monitor closely while making adjustments with insulin, prescribe Zofran and tramadol additionally and Zofran injection given in office, advised increase fluid intake until able to tolerate fluids and given walking referral to orthopedics if symptoms worsen, advise follow-up with ENT if tinnitus persists Final Clinical Impressions(s) /  UC Diagnoses   Final diagnoses:  None   Discharge Instructions   None    ED Prescriptions   None    PDMP not reviewed this encounter.   Hans Eden, NP 05/05/22 [phone]

## 2022-05-05 NOTE — ED Triage Notes (Addendum)
Pt present lower back pain and nausea with tinnitus. Symptoms started back in November from previous fall.

## 2022-07-12 ENCOUNTER — Encounter: Payer: Self-pay | Admitting: Emergency Medicine

## 2022-07-12 ENCOUNTER — Ambulatory Visit
Admission: EM | Admit: 2022-07-12 | Discharge: 2022-07-12 | Disposition: A | Discharge status: Home / Self Care | Payer: Medicare Other | Attending: Physician Assistant | Admitting: Physician Assistant

## 2022-07-12 DIAGNOSIS — G8929 Other chronic pain: Secondary | ICD-10-CM

## 2022-07-12 DIAGNOSIS — M62830 Muscle spasm of back: Secondary | ICD-10-CM | POA: Diagnosis present

## 2022-07-12 DIAGNOSIS — M545 Low back pain, unspecified: Secondary | ICD-10-CM

## 2022-07-12 LAB — URINALYSIS, W/ REFLEX TO CULTURE (INFECTION SUSPECTED)
Bacteria, UA: NONE SEEN
Bilirubin Urine: NEGATIVE
Glucose, UA: NEGATIVE mg/dL
Hgb urine dipstick: NEGATIVE
Ketones, ur: NEGATIVE mg/dL
Leukocytes,Ua: NEGATIVE
Nitrite: NEGATIVE
Protein, ur: NEGATIVE mg/dL
RBC / HPF: NONE SEEN RBC/hpf (ref 0–5)
Specific Gravity, Urine: 1.015 (ref 1.005–1.030)
Squamous Epithelial / HPF: NONE SEEN /HPF (ref 0–5)
pH: 6 (ref 5.0–8.0)

## 2022-07-12 MED ORDER — KETOROLAC TROMETHAMINE 60 MG/2ML IM SOLN
30.0000 mg | Freq: Once | INTRAMUSCULAR | Status: AC
  Administered 2022-07-12: 30 mg via INTRAMUSCULAR

## 2022-07-12 NOTE — ED Triage Notes (Signed)
Pt c/o lower back pain. Started about 3 days ago. But he states it has gotten worse today. He states it burns. No rash. He states he has tried ibuprofen without relief.

## 2022-07-12 NOTE — Discharge Instructions (Addendum)
-  Likely pinched nerve and muscle spasms. -Can take cyclobenzaprine (muscle) 10 mg every 8 hours as needed for muscle spasms -Can take Tramadol (narcotic pain medicine) 50 mg every 6 hours as needed for pain -Can take Gabapentin (never pain medicine) 300 mg every 8-12 hours as needed for pain  -Can also take Tylenol 1 g every 6h as needed for pain. -Continue lidocaine patches, heat, muscle rubs. -If worsening back pain, leg weakness, numbness, loss of bowel or bladder control, go to ER.  -F/u with PCP

## 2022-07-12 NOTE — ED Provider Notes (Signed)
[facility]    CSN: [id number] Arrival date & time: 07/12/22  1655      History   Chief Complaint Chief Complaint  Patient presents with   Back Pain    HPI Jared Hess is a 72 y.o. male presenting for left lower back pain/left flank pain for the past 3-4 days.  He denies any injury.  He does have a history of fracture of L1-L3 back in November 2023 when he fell off the back of a truck.  He says that he has not had any pain from that in the past couple of months though.  He reports that his current pain feels like a deep burning pain and he has occasional sharp shocking pains.  Pain is a little worse when he bends forward.  The back feels stiff.  He denies any radiation of pain to his lower extremity.  He says the skin is sensitive.  He denies any rashes.  He has been taking NSAIDs without relief.  He denies any fever, chills, painful urination but does report possibly a little increased urinary frequency and like he cannot get comfortable.  He does not have any history of kidney stones.  No other concerns.  HPI  Past Medical History:  Diagnosis Date   Chronic kidney disease    Diabetes mellitus without complication ([place])    Prostate cancer ([place])    Thyroid disease     There are no problems to display for this patient.   Past Surgical History:  Procedure Laterality Date   BICEPT TENODESIS Right 07/14/2021   Procedure: BICEPS TENODESIS;  Surgeon: Thornton Park, MD;  Location: [facility];  Service: Orthopedics;  Laterality: Right;   PROSTATE CRYOABLATION  2010   SHOULDER ARTHROSCOPY WITH OPEN ROTATOR CUFF REPAIR AND DISTAL CLAVICLE ACROMINECTOMY Right 07/14/2021   Procedure: SHOULDER ARTHROSCOPY WITH OPEN ROTATOR CUFF REPAIR AND DISTAL CLAVICLE ACROMINECTOMY;  Surgeon: Thornton Park, MD;  Location: [facility];  Service: Orthopedics;  Laterality: Right;   shoulder blade surgery  2002       Home Medications    Prior to Admission medications   Medication Sig  Start Date End Date Taking? Authorizing Provider  insulin degludec (TRESIBA) 100 UNIT/ML FlexTouch Pen Inject into the skin. 03/05/22  Yes [provider]  levothyroxine (SYNTHROID) 112 MCG tablet Take 112 mcg by mouth daily before breakfast. 01/30/19  Yes [provider]  NOVOLOG FLEXPEN 100 UNIT/ML FlexPen Inject 4 Units into the skin 3 (three) times daily before meals. 05/15/21  Yes [provider]  cyclobenzaprine (FLEXERIL) 10 MG tablet Take 1 tablet (10 mg total) by mouth 2 (two) times daily as needed for muscle spasms. 05/05/22   White, Leitha Schuller, NP  liothyronine (CYTOMEL) 5 MCG tablet Take 5 mcg by mouth daily. 01/13/21   [provider]  ondansetron (ZOFRAN) 4 MG tablet Take 1 tablet (4 mg total) by mouth every 8 (eight) hours as needed for nausea or vomiting. 07/14/21   Thornton Park, MD  ondansetron (ZOFRAN-ODT) 4 MG disintegrating tablet Take 1 tablet (4 mg total) by mouth every 8 (eight) hours as needed for nausea or vomiting. 05/05/22   White, Leitha Schuller, NP  oxyCODONE (OXY IR/ROXICODONE) 5 MG immediate release tablet Take 1-2 tablets (5-10 mg total) by mouth every 4 (four) hours as needed. 07/14/21   Thornton Park, MD  predniSONE (DELTASONE) 20 MG tablet Take 2 tablets (40 mg total) by mouth daily. 05/05/22   Hans Eden, NP  traMADol Veatrice Bourbon)  50 MG tablet Take 1 tablet (50 mg total) by mouth every 8 (eight) hours as needed. 05/05/22   White, Adrienne R, NP  TRESIBA FLEXTOUCH 100 UNIT/ML FlexTouch Pen Inject 17 Units into the skin every morning. 04/26/21   [provider]    Family History Family History  Problem Relation Age of Onset   Prostate cancer Neg Hx    Bladder Cancer Neg Hx    Kidney cancer Neg Hx     Social History Social History   Tobacco Use   Smoking status: Never   Smokeless tobacco: Never  Vaping Use   Vaping Use: Never used  Substance Use Topics   Alcohol use: Yes   Drug use: No     Allergies    Patient has no known allergies.   Review of Systems Review of Systems  Constitutional:  Negative for fatigue and fever.  Genitourinary:  Positive for flank pain and frequency. Negative for difficulty urinating, dysuria and hematuria.  Musculoskeletal:  Positive for back pain. Negative for gait problem.  Skin:  Negative for color change, rash and wound.  Neurological:  Negative for weakness and numbness.     Physical Exam Triage Vital Signs ED Triage Vitals  Enc Vitals Group     BP      Pulse      Resp      Temp      Temp src      SpO2      Weight      Height      Head Circumference      Peak Flow      Pain Score      Pain Loc      Pain Edu?      Excl. in [place]?    No data found.  Updated Vital Signs BP (!) 151/98 (BP Location: Left Arm)   Pulse 67   Temp 97.7 F (36.5 C) (Oral)   Resp 16   Ht '6\' 1"'$  (1.854 m)   Wt 199 lb 15.3 oz (90.7 kg)   SpO2 100%   BMI 26.38 kg/m     Physical Exam Vitals and nursing note reviewed.  Constitutional:      General: He is not in acute distress.    Appearance: Normal appearance. He is well-developed. He is not ill-appearing.  HENT:     Head: Normocephalic and atraumatic.  Eyes:     General: No scleral icterus.    Conjunctiva/sclera: Conjunctivae normal.  Cardiovascular:     Rate and Rhythm: Normal rate and regular rhythm.     Heart sounds: Normal heart sounds.  Pulmonary:     Effort: Pulmonary effort is normal. No respiratory distress.     Breath sounds: Normal breath sounds.  Abdominal:     Palpations: Abdomen is soft.     Tenderness: There is no abdominal tenderness. There is left CVA tenderness. There is no right CVA tenderness.  Musculoskeletal:     Cervical back: Neck supple.     Lumbar back: Spasms (left lumbar) and tenderness (L3-L5 spinal TTP and TTP along left paraspinal muscles) present. Normal range of motion. Negative right straight leg raise test and negative left straight leg raise test.  Skin:     General: Skin is warm and dry.     Capillary Refill: Capillary refill takes less than 2 seconds.     Findings: No rash.  Neurological:     General: No focal deficit present.     Mental Status:  He is alert. Mental status is at baseline.     Motor: No weakness.     Gait: Gait normal.  Psychiatric:        Mood and Affect: Mood normal.        Behavior: Behavior normal.      UC Treatments / Results  Labs (all labs ordered are listed, but only abnormal results are displayed) Labs Reviewed  URINALYSIS, W/ REFLEX TO CULTURE (INFECTION SUSPECTED)    EKG   Radiology No results found.  Procedures Procedures (including critical care time)  Medications Ordered in UC Medications  ketorolac (TORADOL) injection 30 mg (30 mg Intramuscular Given 07/12/22 1840)    Initial Impression / Assessment and Plan / UC Course  I have reviewed the triage vital signs and the nursing notes.  Pertinent labs & imaging results that were available during my care of the patient were reviewed by me and considered in my medical decision making (see chart for details).   72 year old male presents for left lower back/flank pain which she describes as burning and sharp for the past 3 to 4-day.  Denies injury.  History of back surgery.  History of trauma in November of last year causing fracture of L1-L3 vertebra.  He says this pain feels different.  No improvement with NSAIDs.  No radiation of pain to lower extremity.  Patient is overall well-appearing.  On exam he has tenderness palpation of the left paraspinal muscles.  There is no visible rash.  Lumbar muscles are very stiff on the left side.  Full range of motion of his back.  Urinalysis obtained. All normal.  Low suspicion for kidney stone in the face of normal urinalysis.  Advised patient most likely he has pinched nerve related to chronic back pain and muscle spasms.  Patient given 30 mg IM ketorolac in clinic for relief of back pain.  Reviewed his  previous kidney function from last month which was normal.  Offered medications to patient but he has multiple medications at home.  I went through the list to see what medications he has at home and advised that he can take cyclobenzaprine every 8 hours as needed for muscle spasms, tramadol every 6 hours as needed for pain, gabapentin every 8-12 hours as needed for pain and Tylenol every 6 hours as needed for pain.  He can also continue using lidocaine patches and heat.  Advised if any worsening of symptoms to return or go to ER.  ED precautions discussed.  We also discussed possibility of shingles.  So, advised that if he does have any emergence of a rash to contact us ASAP so that we can send antiviral medication.   Final Clinical Impressions(s) / UC Diagnoses   Final diagnoses:  Chronic low back pain without sciatica, unspecified back pain laterality  Muscle spasm of back     Discharge Instructions      -Likely pinched nerve and muscle spasms. -Can take cyclobenzaprine (muscle) 10 mg every 8 hours as needed for muscle spasms -Can take Tramadol (narcotic pain medicine) 50 mg every 6 hours as needed for pain -Can take Gabapentin (never pain medicine) 300 mg every 8-12 hours as needed for pain  -Can also take Tylenol 1 g every 6h as needed for pain. -Continue lidocaine patches, heat, muscle rubs. -If worsening back pain, leg weakness, numbness, loss of bowel or bladder control, go to ER.  -F/u with PCP     ED Prescriptions   None    I have  reviewed the PDMP during this encounter.   Danton Clap, PA-C 07/12/22 [id number]

## 2023-06-24 NOTE — Progress Notes (Signed)
 Referring Physician:  Lonell Face, MD [phone] [facility] [address] [facility] [place],  [place] [address]  Primary Physician:  Lauro Regulus, MD  History of Present Illness: 06/27/2023 [name] is here today with a chief complaint of severe bilateral carpal tunnel syndrome.  He has been dealing with this for approximately 8 months.  Feels like is progressively worsening.  He has noticed weakness in his bilateral hands and has started to use his pinky and ring finger to open objects.  He has noticed a loss of sensation in his lateral 4 fingers.  He has also noticed severe burning and tingling in his bilateral hands in the median distribution.  Often wakes from sleep has to squeeze his hands and ring out his hands to make them feel better.  He has tried sleeping with braces which did not help.  He has not had any injections.  He had a recent EMG with bilateral severe carpal tunnel syndrome.  Referred for evaluation for decompression.  Conservative measures:  Physical therapy: No patient has been doing exercises at home not seen by therapy Occupational Therapy: No Hand Therapy: No Injections: No  Gabapentin: yes , Lyrica: No, Cymbalta: No Past Surgery: No   Review of Systems:  A 10 point review of systems is negative, except for the pertinent positives and negatives detailed in the HPI.  Past Medical History: Past Medical History:  Diagnosis Date   Chronic kidney disease    Diabetes mellitus without complication ([facility])    Prostate cancer ([facility])    Thyroid disease     Past Surgical History: Past Surgical History:  Procedure Laterality Date   BICEPT TENODESIS Right 07/14/2021   Procedure: BICEPS TENODESIS;  Surgeon: Juanell Fairly, MD;  Location: [facility];  Service: Orthopedics;  Laterality: Right;   PROSTATE CRYOABLATION  2010   SHOULDER ARTHROSCOPY WITH OPEN ROTATOR CUFF REPAIR AND DISTAL CLAVICLE ACROMINECTOMY Right 07/14/2021   Procedure:  SHOULDER ARTHROSCOPY WITH OPEN ROTATOR CUFF REPAIR AND DISTAL CLAVICLE ACROMINECTOMY;  Surgeon: Juanell Fairly, MD;  Location: [facility];  Service: Orthopedics;  Laterality: Right;   shoulder blade surgery  2002    Allergies: Allergies as of 06/27/2023 - Review Complete 06/27/2023  Allergen Reaction Noted   Armoracia rusticana ext (horseradish) Itching, Nausea And Vomiting, Nausea Only, and Swelling 04/06/2021   Tree extract Itching, Anaphylaxis, and Shortness Of Breath 04/06/2021   Tomato Nausea And Vomiting, Nausea Only, and Swelling 06/27/2023    Medications:  Current Outpatient Medications:    insulin degludec (TRESIBA) 100 UNIT/ML FlexTouch Pen, Inject into the skin., Disp: , Rfl:    insulin lispro (ADMELOG SOLOSTAR) 100 UNIT/ML KwikPen, Inject 10 Units into the skin with breakfast, with lunch, and with evening meal., Disp: , Rfl:    levothyroxine (SYNTHROID) 112 MCG tablet, Take 112 mcg by mouth daily before breakfast., Disp: , Rfl:    lipase/protease/amylase (CREON) 12000-38000 units CPEP capsule, Take 24,000 Units by mouth 3 (three) times daily before meals., Disp: , Rfl:    TRESIBA FLEXTOUCH 100 UNIT/ML FlexTouch Pen, Inject 17 Units into the skin every morning., Disp: , Rfl:    liothyronine (CYTOMEL) 5 MCG tablet, Take 5 mcg by mouth daily. (Patient not taking: Reported on 06/27/2023), Disp: , Rfl:    NOVOLOG FLEXPEN 100 UNIT/ML FlexPen, Inject 4 Units into the skin 3 (three) times daily before meals. (Patient not taking: Reported on 06/27/2023), Disp: , Rfl:   Social History: Social History   Tobacco Use  Smoking status: Never   Smokeless tobacco: Never  Vaping Use   Vaping status: Never Used  Substance Use Topics   Alcohol use: Yes   Drug use: No    Family Medical History: Family History  Problem Relation Age of Onset   Prostate cancer Neg Hx    Bladder Cancer Neg Hx    Kidney cancer Neg Hx     Physical Examination: Vitals:   06/27/23 1410  BP: 138/78     General: Patient is in no apparent distress. Attention to examination is appropriate.  Neck:   Supple.  Full range of motion.  Respiratory: Patient is breathing without any difficulty.   NEUROLOGICAL:     Awake, alert, oriented to person, place, and time.  Speech is clear and fluent.   Cranial Nerves: Pupils equal round and reactive to light.  Facial tone is symmetric. Shoulder shrug is symmetric. Tongue protrusion is midline.  There is no pronator drift.  Motor Exam:  He has thenar wasting bilaterally.  Severe weakness in thumb abduction, significant weakness noted in thumb opposition.  Weakness in his median lumbricals.  Thumb weakness is approximately 2-3 out of 5, lumbrical weakness is 4 out of 5,  Ulnar sensation is intact bilaterally, median sensation severely decreased with increased dysesthesias noted in the median nerve distribution.  Positive Tinel sign, patient with constant tingling in his median distribution, somewhat mildly worsened with Phalen's, reverse Phalen's, and carpal compression test.   Medical Decision Making  Electrodiagnostics: Jolene Provost, MD - 05/31/2023 4:00 PM EST Formatting of this note is different from the original. Images from the original note were not included. [facility] - Neurology Department [address] [place], [place] [address] [phone] (Phone); [phone] (Fax) Test Date: 05/31/2023  Patient: Jared Hess DOB: 12/03/1950 Physician: Dr. Cristopher Peru Chart#: [id number] Sex: Male Ref Phys: Dr. Cristopher Peru  Patient History: Patient is a 73 year-old male who presents with bilateral arm numbness and tingling from the elbow down. Has bilateral hand weakness, numbness. Denies neck pain. 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 Past medical history is significant for diabetes, cancer, and thyroid problems. Back surgery.  Exam: Sensation testing revealed. Decrease sensation to  light touch in bilateral median distribution and splits 4th digit bilaterally, strength 5/5 and DTR symmetric.  EMG & NCV Findings: Evaluation of the Left median motor, the Right median motor, the Left median sensory, and the Right median sensory nerves showed no response (Wrist). The Right ulnar sensory nerve showed reduced amplitude (5.4 V) and decreased conduction velocity (Wrist-5th Digit, 36 m/s). The Left median/ulnar (palm) comparison and the Right median/ulnar (palm) comparison nerves showed no response (Median Palm). All remaining nerves (as indicated in the following tables) were within normal limits. EMG  Side Muscle Nerve Root Ins Act Fibs Psw Amp Dur Poly Recrt Int Dennie Bible Comment Left Abd Poll Brev Median C8-T1 Nml Nml Nml Incr >90ms 1+ mild Reduced Nml Left 1stDorInt Ulnar C8-T1 Nml Nml Nml Nml Nml 0 Nml Nml Left ExtDigCom Radial (Post Int) C7-8 Nml Nml Nml Nml Nml 0 Nml Nml Left PronatorTeres Median C6-7 Nml Nml Nml Nml Nml 0 Nml Nml Right Abd Poll Brev Median C8-T1 Nml Nml Nml Incr >82ms 1+ mild Reduced Nml Right 1stDorInt Ulnar C8-T1 Nml Nml Nml Nml Nml 0 Nml Nml Right ExtDigCom Radial (Post Int) C7-8 Nml Nml Nml Nml Nml 0 Nml Nml Right PronatorTeres Median C6-7 Nml Nml Nml Nml Nml 0 Nml Nml  Impression: This is an abnormal electrodiagnostic exam consistent with bilateral severe (grade IV) carpal tunnel syndrome (median nerve entrapment at wrist).  Thank you for the referral of this patient. It was our privilege to participate in care of your patient. Feel free to contact us with any further questions.  _____________________________ Cristopher Peru, M.D.   I have personally reviewed the images and electrodiagnostics and agree with the above interpretation.  Assessment and Plan: Mr. Dematteo is a pleasant 73 y.o. male with with severe bilateral carpal tunnel syndrome.  This been progressive over the past 8 months.  He has lost thenar mass as well as strength in his hands.  He has  lost significant amount of sensation in his bilateral median distribution.  He notes waking symptoms and has woken up every night having to ring out his hands.  This is severely impacting his sleep.  He is gotten a recent EMG which demonstrated severe bilateral carpal tunnel syndrome.  Given these findings we will plan for carpal tunnel decompression.  On physical examination he has thenar wasting and weakness bilaterally.  Has a positive Tinel at the wrist bilaterally.  Will start with the right and this is his dominant hand, and then we will proceed to go forward with the left hand a decompression.  The symptoms are causing a significant impact on the patient's life. I have utilized the care everywhere function in epic to review the outside records available from external health systems, and have personally reviewed relevant imaging and electrodiagnostic workup.   Thank you for involving me in the care of this patient. \  Lovenia Kim MD/MSCR Neurosurgery - Peripheral Nerve Surgery

## 2023-06-24 NOTE — H&P (View-Only) (Signed)
Referring Physician:  Lonell Face, MD [phone] [facility] [address] [facility] [place],  [place] [address]  Primary Physician:  Lauro Regulus, MD  History of Present Illness: 06/27/2023 Jared Hess is here today with a chief complaint of severe bilateral carpal tunnel syndrome.  He has been dealing with this for approximately 8 months.  Feels like is progressively worsening.  He has noticed weakness in his bilateral hands and has started to use his pinky and ring finger to open objects.  He has noticed a loss of sensation in his lateral 4 fingers.  He has also noticed severe burning and tingling in his bilateral hands in the median distribution.  Often wakes from sleep has to squeeze his hands and ring out his hands to make them feel better.  He has tried sleeping with braces which did not help.  He has not had any injections.  He had a recent EMG with bilateral severe carpal tunnel syndrome.  Referred for evaluation for decompression.  Conservative measures:  Physical therapy: No patient has been doing exercises at home not seen by therapy Occupational Therapy: No Hand Therapy: No Injections: No  Gabapentin: yes , Lyrica: No, Cymbalta: No Past Surgery: No   Review of Systems:  A 10 point review of systems is negative, except for the pertinent positives and negatives detailed in the HPI.  Past Medical History: Past Medical History:  Diagnosis Date   Chronic kidney disease    Diabetes mellitus without complication (HCC)    Prostate cancer (HCC)    Thyroid disease     Past Surgical History: Past Surgical History:  Procedure Laterality Date   BICEPT TENODESIS Right 07/14/2021   Procedure: BICEPS TENODESIS;  Surgeon: Juanell Fairly, MD;  Location: [facility] ORS;  Service: Orthopedics;  Laterality: Right;   PROSTATE CRYOABLATION  2010   SHOULDER ARTHROSCOPY WITH OPEN ROTATOR CUFF REPAIR AND DISTAL CLAVICLE ACROMINECTOMY Right 07/14/2021   Procedure:  SHOULDER ARTHROSCOPY WITH OPEN ROTATOR CUFF REPAIR AND DISTAL CLAVICLE ACROMINECTOMY;  Surgeon: Juanell Fairly, MD;  Location: [facility] ORS;  Service: Orthopedics;  Laterality: Right;   shoulder blade surgery  2002    Allergies: Allergies as of 06/27/2023 - Review Complete 06/27/2023  Allergen Reaction Noted   Armoracia rusticana ext (horseradish) Itching, Nausea And Vomiting, Nausea Only, and Swelling 04/06/2021   Tree extract Itching, Anaphylaxis, and Shortness Of Breath 04/06/2021   Tomato Nausea And Vomiting, Nausea Only, and Swelling 06/27/2023    Medications:  Current Outpatient Medications:    insulin degludec (TRESIBA) 100 UNIT/ML FlexTouch Pen, Inject into the skin., Disp: , Rfl:    insulin lispro (ADMELOG SOLOSTAR) 100 UNIT/ML KwikPen, Inject 10 Units into the skin with breakfast, with lunch, and with evening meal., Disp: , Rfl:    levothyroxine (SYNTHROID) 112 MCG tablet, Take 112 mcg by mouth daily before breakfast., Disp: , Rfl:    lipase/protease/amylase (CREON) 12000-38000 units CPEP capsule, Take 24,000 Units by mouth 3 (three) times daily before meals., Disp: , Rfl:    TRESIBA FLEXTOUCH 100 UNIT/ML FlexTouch Pen, Inject 17 Units into the skin every morning., Disp: , Rfl:    liothyronine (CYTOMEL) 5 MCG tablet, Take 5 mcg by mouth daily. (Patient not taking: Reported on 06/27/2023), Disp: , Rfl:    NOVOLOG FLEXPEN 100 UNIT/ML FlexPen, Inject 4 Units into the skin 3 (three) times daily before meals. (Patient not taking: Reported on 06/27/2023), Disp: , Rfl:   Social History: Social History   Tobacco Use  Smoking status: Never   Smokeless tobacco: Never  Vaping Use   Vaping status: Never Used  Substance Use Topics   Alcohol use: Yes   Drug use: No    Family Medical History: Family History  Problem Relation Age of Onset   Prostate cancer Neg Hx    Bladder Cancer Neg Hx    Kidney cancer Neg Hx     Physical Examination: Vitals:   06/27/23 1410  BP: 138/78     General: Patient is in no apparent distress. Attention to examination is appropriate.  Neck:   Supple.  Full range of motion.  Respiratory: Patient is breathing without any difficulty.   NEUROLOGICAL:     Awake, alert, oriented to person, place, and time.  Speech is clear and fluent.   Cranial Nerves: Pupils equal round and reactive to light.  Facial tone is symmetric. Shoulder shrug is symmetric. Tongue protrusion is midline.  There is no pronator drift.  Motor Exam:  He has thenar wasting bilaterally.  Severe weakness in thumb abduction, significant weakness noted in thumb opposition.  Weakness in his median lumbricals.  Thumb weakness is approximately 2-3 out of 5, lumbrical weakness is 4 out of 5,  Ulnar sensation is intact bilaterally, median sensation severely decreased with increased dysesthesias noted in the median nerve distribution.  Positive Tinel sign, patient with constant tingling in his median distribution, somewhat mildly worsened with Phalen's, reverse Phalen's, and carpal compression test.   Medical Decision Making  Electrodiagnostics: Jolene Provost, MD - 05/31/2023 4:00 PM EST Formatting of this note is different from the original. Images from the original note were not included. [facility] - Neurology Department [address] [place], [place] [address] [phone] (Phone); [phone] (Fax) Test Date: 05/31/2023  Patient: Jared Hess DOB: 12/03/1950 Physician: Dr. Cristopher Peru Chart#: [id number] Sex: Male Ref Phys: Dr. Cristopher Peru  Patient History: Patient is a 72 year-old male who presents with bilateral arm numbness and tingling from the elbow down. Has bilateral hand weakness, numbness. Denies neck pain. 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 Past medical history is significant for diabetes, cancer, and thyroid problems. Back surgery.  Exam: Sensation testing revealed. Decrease sensation to  light touch in bilateral median distribution and splits 4th digit bilaterally, strength 5/5 and DTR symmetric.  EMG & NCV Findings: Evaluation of the Left median motor, the Right median motor, the Left median sensory, and the Right median sensory nerves showed no response (Wrist). The Right ulnar sensory nerve showed reduced amplitude (5.4 V) and decreased conduction velocity (Wrist-5th Digit, 36 m/s). The Left median/ulnar (palm) comparison and the Right median/ulnar (palm) comparison nerves showed no response (Median Palm). All remaining nerves (as indicated in the following tables) were within normal limits. EMG  Side Muscle Nerve Root Ins Act Fibs Psw Amp Dur Poly Recrt Int Dennie Bible Comment Left Abd Poll Brev Median C8-T1 Nml Nml Nml Incr >90ms 1+ mild Reduced Nml Left 1stDorInt Ulnar C8-T1 Nml Nml Nml Nml Nml 0 Nml Nml Left ExtDigCom Radial (Post Int) C7-8 Nml Nml Nml Nml Nml 0 Nml Nml Left PronatorTeres Median C6-7 Nml Nml Nml Nml Nml 0 Nml Nml Right Abd Poll Brev Median C8-T1 Nml Nml Nml Incr >82ms 1+ mild Reduced Nml Right 1stDorInt Ulnar C8-T1 Nml Nml Nml Nml Nml 0 Nml Nml Right ExtDigCom Radial (Post Int) C7-8 Nml Nml Nml Nml Nml 0 Nml Nml Right PronatorTeres Median C6-7 Nml Nml Nml Nml Nml 0 Nml Nml  Impression: This is an abnormal electrodiagnostic exam consistent with bilateral severe (grade IV) carpal tunnel syndrome (median nerve entrapment at wrist).  Thank you for the referral of this patient. It was our privilege to participate in care of your patient. Feel free to contact us with any further questions.  _____________________________ Cristopher Peru, M.D.   I have personally reviewed the images and electrodiagnostics and agree with the above interpretation.  Assessment and Plan: Jared Hess is a pleasant 73 y.o. male with with severe bilateral carpal tunnel syndrome.  This been progressive over the past 8 months.  He has lost thenar mass as well as strength in his hands.  He has  lost significant amount of sensation in his bilateral median distribution.  He notes waking symptoms and has woken up every night having to ring out his hands.  This is severely impacting his sleep.  He is gotten a recent EMG which demonstrated severe bilateral carpal tunnel syndrome.  Given these findings we will plan for carpal tunnel decompression.  On physical examination he has thenar wasting and weakness bilaterally.  Has a positive Tinel at the wrist bilaterally.  Will start with the right and this is his dominant hand, and then we will proceed to go forward with the left hand a decompression.  The symptoms are causing a significant impact on the patient's life. I have utilized the care everywhere function in epic to review the outside records available from external health systems, and have personally reviewed relevant imaging and electrodiagnostic workup.   Thank you for involving me in the care of this patient. \  Lovenia Kim MD/MSCR Neurosurgery - Peripheral Nerve Surgery

## 2023-06-27 ENCOUNTER — Other Ambulatory Visit: Payer: Self-pay

## 2023-06-27 ENCOUNTER — Encounter: Payer: Self-pay | Admitting: Neurosurgery

## 2023-06-27 ENCOUNTER — Ambulatory Visit: Payer: Medicare Other | Admitting: Neurosurgery

## 2023-06-27 VITALS — BP 138/78 | Wt 190.4 lb

## 2023-06-27 DIAGNOSIS — G5601 Carpal tunnel syndrome, right upper limb: Secondary | ICD-10-CM | POA: Insufficient documentation

## 2023-06-27 DIAGNOSIS — G5603 Carpal tunnel syndrome, bilateral upper limbs: Secondary | ICD-10-CM | POA: Insufficient documentation

## 2023-06-27 DIAGNOSIS — Z01818 Encounter for other preprocedural examination: Secondary | ICD-10-CM

## 2023-06-27 NOTE — Patient Instructions (Addendum)
 Please see below for information in regards to your upcoming surgery:   Planned surgery: 1) Right carpal tunnel release with ultrasound guidance; 2) left carpal tunnel release with ultrasound guidance   Surgery date: 07/12/23 (right) & 08/09/23 (left) at [facility] (Medical Mall: [address] [place], [place] [address]) - you will find out your arrival time the business day before your surgery.   Pre-op appointment at [facility] Pre-admit Testing: we will call you with a date/time for this. If you are scheduled for an in person appointment, Pre-admit Testing is located on the first floor of the [facility] building, [address] [facility], [address]. Please bring all prescriptions in the original prescription bottles to your appointment. During this appointment, they will advise you which medications you can take the morning of surgery, and which medications you will need to hold for surgery. Labs (such as blood work, EKG) may be done at your pre-op appointment. You are not required to fast for these labs. Should you need to change your pre-op appointment, please call Pre-admit testing at [phone].      How to contact us :  If you have any questions/concerns before or after surgery, you can reach us  at [phone], or you can send a mychart message. We can be reached by phone or mychart 8am-4pm, Monday-Friday.  *Please note: Calls after 4pm are forwarded to a third party answering service. Mychart messages are not routinely monitored during evenings, weekends, and holidays. Please call our office to contact the answering service for urgent concerns during non-business hours.     If you have FMLA/disability paperwork, please drop it off or fax it to [phone], attention Patty.   Appointments/FMLA & disability paperwork: Odetta Mora, & Ritta Registered Nurse/Surgery scheduler: Othelia Medical Assistants: Damien ODESSIA Sailors Physician Assistants: Lyle Decamp, PA-C, Edsel Goods, PA-C & Glade Boys, PA-C Surgeons: Reeves Daisy, MD & Penne Sharps, MD

## 2023-07-04 ENCOUNTER — Encounter
Admission: RE | Admit: 2023-07-04 | Discharge: 2023-07-04 | Disposition: A | Discharge status: Home / Self Care | Payer: Medicare Other | Source: Ambulatory Visit | Attending: Neurosurgery | Admitting: Neurosurgery

## 2023-07-04 VITALS — BP 134/82 | HR 75 | Ht 73.0 in | Wt 190.3 lb

## 2023-07-04 DIAGNOSIS — E109 Type 1 diabetes mellitus without complications: Secondary | ICD-10-CM | POA: Insufficient documentation

## 2023-07-04 DIAGNOSIS — Z01818 Encounter for other preprocedural examination: Secondary | ICD-10-CM | POA: Insufficient documentation

## 2023-07-04 DIAGNOSIS — E1165 Type 2 diabetes mellitus with hyperglycemia: Secondary | ICD-10-CM

## 2023-07-04 DIAGNOSIS — Z01810 Encounter for preprocedural cardiovascular examination: Secondary | ICD-10-CM | POA: Diagnosis not present

## 2023-07-04 HISTORY — DX: Polyneuropathy, unspecified: G62.9

## 2023-07-04 HISTORY — DX: Other chronic pain: M54.50

## 2023-07-04 HISTORY — DX: Male erectile dysfunction, unspecified: N52.9

## 2023-07-04 HISTORY — DX: Hypothyroidism, unspecified: E03.9

## 2023-07-04 HISTORY — DX: Type 1 diabetes mellitus without complications: E10.9

## 2023-07-04 HISTORY — DX: Chronic kidney disease, stage 3 unspecified: N18.30

## 2023-07-04 HISTORY — DX: Unspecified dementia, unspecified severity, without behavioral disturbance, psychotic disturbance, mood disturbance, and anxiety: F03.90

## 2023-07-04 HISTORY — DX: Carpal tunnel syndrome, bilateral upper limbs: G56.03

## 2023-07-04 HISTORY — DX: Other chronic pain: G89.29

## 2023-07-04 NOTE — Patient Instructions (Addendum)
Your procedure is scheduled on:07-12-23 Tuesday Report to the Registration Desk on the 1st floor of the Medical Mall.Then proceed to the 2nd floor Surgery Desk To find out your arrival time, please call [phone] between 1PM - 3PM on:07-11-23 Monday If your arrival time is 6:00 am, do not arrive before that time as the Medical Mall entrance doors do not open until 6:00 am.  REMEMBER: Instructions that are not followed completely may result in serious medical risk, up to and including death; or upon the discretion of your surgeon and anesthesiologist your surgery may need to be rescheduled.  Do not eat food after midnight the night before surgery.  No gum chewing or hard candies.  You may however, drink Water up to 2 hours before you are scheduled to arrive for your surgery. Do not drink anything within 2 hours of your scheduled arrival time  One week prior to surgery:Stop NOW (07-04-23) Stop Anti-inflammatories (NSAIDS) such as Advil, Aleve, Ibuprofen, Motrin, Naproxen, Naprosyn and Aspirin based products such as Excedrin, Goody's Powder, BC Powder. Stop ANY OVER THE COUNTER supplements until after surgery (Vitamin B12, Lion Man)  You may however, continue to take Tylenol if needed for pain up until the day of surgery.  Continue taking all of your other prescription medications up until the day of surgery.  ON THE DAY OF SURGERY ONLY TAKE THESE MEDICATIONS WITH SIPS OF WATER: -levothyroxine (SYNTHROID)   Do NOT take any Insulin the morning of surgery  No Alcohol for 24 hours before or after surgery.  No Smoking including e-cigarettes for 24 hours before surgery.  No chewable tobacco products for at least 6 hours before surgery.  No nicotine patches on the day of surgery.  Do not use any "recreational" drugs for at least a week (preferably 2 weeks) before your surgery.  Please be advised that the combination of cocaine and anesthesia may have negative outcomes, up to and including  death. If you test positive for cocaine, your surgery will be cancelled.  On the morning of surgery brush your teeth with toothpaste and water, you may rinse your mouth with mouthwash if you wish. Do not swallow any toothpaste or mouthwash.  Use CHG Soap as directed on instruction sheet.  Do not wear jewelry, make-up, hairpins, clips or nail polish.  For welded (permanent) jewelry: bracelets, anklets, waist bands, etc.  Please have this removed prior to surgery.  If it is not removed, there is a chance that hospital personnel will need to cut it off on the day of surgery.  Do not wear lotions, powders, or perfumes.   Do not shave body hair from the neck down 48 hours before surgery.  Contact lenses, hearing aids and dentures may not be worn into surgery.  Do not bring valuables to the hospital. [facility] is not responsible for any missing/lost belongings or valuables.   Notify your doctor if there is any change in your medical condition (cold, fever, infection).  Wear comfortable clothing (specific to your surgery type) to the hospital.  After surgery, you can help prevent lung complications by doing breathing exercises.  Take deep breaths and cough every 1-2 hours. Your doctor may order a device called an Incentive Spirometer to help you take deep breaths. When coughing or sneezing, hold a pillow firmly against your incision with both hands. This is called "splinting." Doing this helps protect your incision. It also decreases belly discomfort.  If you are being admitted to the hospital overnight, leave your  suitcase in the car. After surgery it may be brought to your room.  In case of increased patient census, it may be necessary for you, the patient, to continue your postoperative care in the Same Day Surgery department.  If you are being discharged the day of surgery, you will not be allowed to drive home. You will need a responsible individual to drive you home and stay with  you for 24 hours after surgery.   If you are taking public transportation, you will need to have a responsible individual with you.  Please call the Pre-admissions Testing Dept. at [phone] if you have any questions about these instructions.  Surgery Visitation Policy:  Patients having surgery or a procedure may have two visitors.  Children under the age of 33 must have an adult with them who is not the patient.  Temporary Visitor Restrictions Due to increasing cases of flu, RSV and COVID-19: Children ages 102 and under will not be able to visit patients in [facility] hospitals under most circumstances.     Preparing for Surgery with CHLORHEXIDINE GLUCONATE (CHG) Soap  Chlorhexidine Gluconate (CHG) Soap  o An antiseptic cleaner that kills germs and bonds with the skin to continue killing germs even after washing  o Used for showering the night before surgery and morning of surgery  Before surgery, you can play an important role by reducing the number of germs on your skin.  CHG (Chlorhexidine gluconate) soap is an antiseptic cleanser which kills germs and bonds with the skin to continue killing germs even after washing.  Please do not use if you have an allergy to CHG or antibacterial soaps. If your skin becomes reddened/irritated stop using the CHG.  1. Shower the NIGHT BEFORE SURGERY and the MORNING OF SURGERY with CHG soap.  2. If you choose to wash your hair, wash your hair first as usual with your normal shampoo.  3. After shampooing, rinse your hair and body thoroughly to remove the shampoo.  4. Use CHG as you would any other liquid soap. You can apply CHG directly to the skin and wash gently with a scrungie or a clean washcloth.  5. Apply the CHG soap to your body only from the neck down. Do not use on open wounds or open sores. Avoid contact with your eyes, ears, mouth, and genitals (private parts). Wash face and genitals (private parts) with your normal  soap.  6. Wash thoroughly, paying special attention to the area where your surgery will be performed.  7. Thoroughly rinse your body with warm water.  8. Do not shower/wash with your normal soap after using and rinsing off the CHG soap.  9. Pat yourself dry with a clean towel.  10. Wear clean pajamas to bed the night before surgery.  12. Place clean sheets on your bed the night of your first shower and do not sleep with pets.  13. Shower again with the CHG soap on the day of surgery prior to arriving at the hospital.  14. Do not apply any deodorants/lotions/powders.  15. Please wear clean clothes to the hospital.

## 2023-07-12 ENCOUNTER — Ambulatory Visit: Payer: Medicare Other | Admitting: Urgent Care

## 2023-07-12 ENCOUNTER — Encounter: Payer: Self-pay | Admitting: Neurosurgery

## 2023-07-12 ENCOUNTER — Ambulatory Visit
Admission: RE | Admit: 2023-07-12 | Discharge: 2023-07-12 | Disposition: A | Discharge status: Home / Self Care | Payer: Medicare Other | Attending: Neurosurgery | Admitting: Neurosurgery

## 2023-07-12 ENCOUNTER — Ambulatory Visit: Payer: Self-pay | Admitting: Anesthesiology

## 2023-07-12 ENCOUNTER — Other Ambulatory Visit: Payer: Self-pay

## 2023-07-12 ENCOUNTER — Encounter
Admission: RE | Disposition: A | Discharge status: Home / Self Care | Payer: Self-pay | Source: Home / Self Care | Attending: Neurosurgery

## 2023-07-12 DIAGNOSIS — Z01818 Encounter for other preprocedural examination: Secondary | ICD-10-CM

## 2023-07-12 DIAGNOSIS — N189 Chronic kidney disease, unspecified: Secondary | ICD-10-CM | POA: Diagnosis not present

## 2023-07-12 DIAGNOSIS — F0390 Unspecified dementia without behavioral disturbance: Secondary | ICD-10-CM | POA: Insufficient documentation

## 2023-07-12 DIAGNOSIS — G5601 Carpal tunnel syndrome, right upper limb: Secondary | ICD-10-CM | POA: Diagnosis not present

## 2023-07-12 DIAGNOSIS — G709 Myoneural disorder, unspecified: Secondary | ICD-10-CM | POA: Insufficient documentation

## 2023-07-12 DIAGNOSIS — E1122 Type 2 diabetes mellitus with diabetic chronic kidney disease: Secondary | ICD-10-CM | POA: Diagnosis not present

## 2023-07-12 DIAGNOSIS — Z794 Long term (current) use of insulin: Secondary | ICD-10-CM | POA: Diagnosis not present

## 2023-07-12 DIAGNOSIS — E039 Hypothyroidism, unspecified: Secondary | ICD-10-CM | POA: Insufficient documentation

## 2023-07-12 DIAGNOSIS — G5603 Carpal tunnel syndrome, bilateral upper limbs: Secondary | ICD-10-CM | POA: Insufficient documentation

## 2023-07-12 HISTORY — PX: CARPAL TUNNEL RELEASE: SHX101

## 2023-07-12 LAB — GLUCOSE, CAPILLARY
Glucose-Capillary: 152 mg/dL — ABNORMAL HIGH (ref 70–99)
Glucose-Capillary: 151 mg/dL — ABNORMAL HIGH (ref 70–99)

## 2023-07-12 SURGERY — CARPAL TUNNEL RELEASE
Anesthesia: General | Laterality: Right

## 2023-07-12 MED ORDER — SODIUM CHLORIDE 0.9 % IV SOLN: INTRAVENOUS | Status: DC

## 2023-07-12 MED ORDER — CHLORHEXIDINE GLUCONATE 0.12 % MT SOLN
15.0000 mL | Freq: Once | OROMUCOSAL | Status: AC
  Administered 2023-07-12: 15 mL via OROMUCOSAL

## 2023-07-12 MED ORDER — CEFAZOLIN IN SODIUM CHLORIDE 2-0.9 GM/100ML-% IV SOLN
2.0000 g | Freq: Once | INTRAVENOUS | Status: DC
  Filled 2023-07-12: qty 100

## 2023-07-12 MED ORDER — LIDOCAINE HCL 1 % IJ SOLN
INTRAMUSCULAR | Status: DC | PRN
  Administered 2023-07-12: 3 mL

## 2023-07-12 MED ORDER — FENTANYL CITRATE (PF) 100 MCG/2ML IJ SOLN: 25.0000 ug | INTRAMUSCULAR | Status: DC | PRN

## 2023-07-12 MED ORDER — ONDANSETRON HCL 4 MG/2ML IJ SOLN
INTRAMUSCULAR | Status: AC
  Filled 2023-07-12: qty 2

## 2023-07-12 MED ORDER — CEFAZOLIN SODIUM-DEXTROSE 2-4 GM/100ML-% IV SOLN
2.0000 g | INTRAVENOUS | Status: DC
Start: 2023-07-12 — End: 2023-07-12

## 2023-07-12 MED ORDER — ORAL CARE MOUTH RINSE: 15.0000 mL | Freq: Once | OROMUCOSAL | Status: AC

## 2023-07-12 MED ORDER — OXYCODONE HCL 5 MG PO TABS
5.0000 mg | ORAL_TABLET | Freq: Once | ORAL | Status: DC | PRN
Start: 2023-07-12 — End: 2023-07-12

## 2023-07-12 MED ORDER — CEFAZOLIN SODIUM-DEXTROSE 2-4 GM/100ML-% IV SOLN
INTRAVENOUS | Status: AC
  Filled 2023-07-12: qty 100

## 2023-07-12 MED ORDER — LIDOCAINE HCL (PF) 2 % IJ SOLN
INTRAMUSCULAR | Status: AC
  Filled 2023-07-12: qty 5

## 2023-07-12 MED ORDER — LIDOCAINE HCL (CARDIAC) PF 100 MG/5ML IV SOSY
PREFILLED_SYRINGE | INTRAVENOUS | Status: DC | PRN
  Administered 2023-07-12: 60 mg via INTRAVENOUS

## 2023-07-12 MED ORDER — FENTANYL CITRATE (PF) 100 MCG/2ML IJ SOLN
INTRAMUSCULAR | Status: AC
  Filled 2023-07-12: qty 2

## 2023-07-12 MED ORDER — ONDANSETRON HCL 4 MG/2ML IJ SOLN
INTRAMUSCULAR | Status: DC | PRN
  Administered 2023-07-12: 4 mg via INTRAVENOUS

## 2023-07-12 MED ORDER — 0.9 % SODIUM CHLORIDE (POUR BTL) OPTIME
TOPICAL | Status: DC | PRN
  Administered 2023-07-12: 500 mL

## 2023-07-12 MED ORDER — DOCUSATE SODIUM 100 MG PO CAPS: 100.0000 mg | ORAL_CAPSULE | Freq: Two times a day (BID) | ORAL | 0 refills | Status: DC

## 2023-07-12 MED ORDER — PROPOFOL 10 MG/ML IV BOLUS
INTRAVENOUS | Status: AC
  Filled 2023-07-12: qty 20

## 2023-07-12 MED ORDER — PROPOFOL 10 MG/ML IV BOLUS
INTRAVENOUS | Status: DC | PRN
  Administered 2023-07-12: 200 mg via INTRAVENOUS

## 2023-07-12 MED ORDER — HYDROCODONE-ACETAMINOPHEN 5-325 MG PO TABS: 1.0000 | ORAL_TABLET | ORAL | 0 refills | Status: AC | PRN

## 2023-07-12 MED ORDER — FENTANYL CITRATE (PF) 100 MCG/2ML IJ SOLN
INTRAMUSCULAR | Status: DC | PRN
  Administered 2023-07-12: 25 ug via INTRAVENOUS

## 2023-07-12 MED ORDER — LIDOCAINE HCL (PF) 1 % IJ SOLN
INTRAMUSCULAR | Status: AC
Start: 2023-07-12 — End: ?
  Filled 2023-07-12: qty 30

## 2023-07-12 MED ORDER — PROPOFOL 10 MG/ML IV BOLUS
INTRAVENOUS | Status: AC
  Filled 2023-07-12: qty 40

## 2023-07-12 MED ORDER — OXYCODONE HCL 5 MG/5ML PO SOLN: 5.0000 mg | Freq: Once | ORAL | Status: DC | PRN

## 2023-07-12 MED ORDER — CHLORHEXIDINE GLUCONATE 0.12 % MT SOLN
OROMUCOSAL | Status: AC
  Filled 2023-07-12: qty 15

## 2023-07-12 SURGICAL SUPPLY — 17 items
BRUSH SCRUB EZ 4% CHG (MISCELLANEOUS) ×1 IMPLANT
CHLORAPREP W/TINT 26 (MISCELLANEOUS) ×2 IMPLANT
COVER PROBE FLX POLY STRL (MISCELLANEOUS) IMPLANT
GLOVE BIOGEL PI IND STRL 8 (GLOVE) ×1 IMPLANT
GLOVE SRG 8 PF TXTR STRL LF DI (GLOVE) ×1 IMPLANT
GLOVE SURG SYN 7.5 E (GLOVE) ×1
GLOVE SURG SYN 7.5 PF PI (GLOVE) ×1 IMPLANT
GOWN SRG XL LVL 3 NONREINFORCE (GOWNS) ×1 IMPLANT
KIT TURNOVER KIT A (KITS) ×1 IMPLANT
MANIFOLD NEPTUNE II (INSTRUMENTS) ×1 IMPLANT
NS IRRIG 500ML POUR BTL (IV SOLUTION) ×1 IMPLANT
PACK EXTREMITY ARMC (MISCELLANEOUS) ×1 IMPLANT
PAD ARMBOARD 7.5X6 YLW CONV (MISCELLANEOUS) ×1 IMPLANT
STOCKINETTE IMPERVIOUS 9X36 MD (GAUZE/BANDAGES/DRESSINGS) ×1 IMPLANT
TOWEL OR 17X26 4PK STRL BLUE (TOWEL DISPOSABLE) ×1 IMPLANT
ULTRAGLIDE CTR (BLADE) IMPLANT
WATER STERILE IRR 500ML POUR (IV SOLUTION) ×1 IMPLANT

## 2023-07-12 NOTE — Anesthesia Preprocedure Evaluation (Signed)
Anesthesia Evaluation  Patient identified by MRN, date of birth, ID band Patient awake    Reviewed: Allergy & Precautions, NPO status , Patient's Chart, lab work & pertinent test results  History of Anesthesia Complications Negative for: history of anesthetic complications  Airway Mallampati: I  TM Distance: >3 FB Neck ROM: full    Dental no notable dental hx.    Pulmonary neg pulmonary ROS   Pulmonary exam normal        Cardiovascular negative cardio ROS Normal cardiovascular exam     Neuro/Psych  PSYCHIATRIC DISORDERS     Dementia  Neuromuscular disease    GI/Hepatic negative GI ROS, Neg liver ROS,,,  Endo/Other  diabetesHypothyroidism    Renal/GU Renal disease  negative genitourinary   Musculoskeletal   Abdominal   Peds  Hematology negative hematology ROS (+)   Anesthesia Other Findings Past Medical History: No date: Bilateral carpal tunnel syndrome No date: Chronic bilateral low back pain without sciatica No date: CKD (chronic kidney disease) stage 3, GFR 30-59 ml/min (HCC) No date: Dementia (HCC) No date: DM (diabetes mellitus), type 1 (HCC) No date: ED (erectile dysfunction) No date: Hypothyroidism No date: Neuropathy No date: Prostate cancer (HCC) No date: Thyroid disease  Past Surgical History: 07/14/2021: BICEPT TENODESIS; Right     Comment:  Procedure: BICEPS TENODESIS;  Surgeon: Juanell Fairly,              MD;  Location: [facility] ORS;  Service: Orthopedics;                Laterality: Right; No date: FOOT SURGERY; Left No date: LUMBAR FUSION 2010: PROSTATE CRYOABLATION 07/14/2021: SHOULDER ARTHROSCOPY WITH OPEN ROTATOR CUFF REPAIR AND  DISTAL CLAVICLE ACROMINECTOMY; Right     Comment:  Procedure: SHOULDER ARTHROSCOPY WITH OPEN ROTATOR CUFF               REPAIR AND DISTAL CLAVICLE ACROMINECTOMY;  Surgeon:               Juanell Fairly, MD;  Location: [facility] ORS;  Service:                Orthopedics;  Laterality: Right; 2002: shoulder blade surgery  BMI    Body Mass Index: 25.10 kg/m      Reproductive/Obstetrics negative OB ROS                             Anesthesia Physical Anesthesia Plan  ASA: 2  Anesthesia Plan: General   Post-op Pain Management: Minimal or no pain anticipated   Induction: Intravenous  PONV Risk Score and Plan: 2 and Propofol infusion and TIVA  Airway Management Planned: Natural Airway and Nasal Cannula  Additional Equipment:   Intra-op Plan:   Post-operative Plan:   Informed Consent: I have reviewed the patients History and Physical, chart, labs and discussed the procedure including the risks, benefits and alternatives for the proposed anesthesia with the patient or authorized representative who has indicated his/her understanding and acceptance.     Dental Advisory Given  Plan Discussed with: Anesthesiologist, CRNA and Surgeon  Anesthesia Plan Comments: (Patient consented for risks of anesthesia including but not limited to:  - adverse reactions to medications - risk of airway placement if required - damage to eyes, teeth, lips or other oral mucosa - nerve damage due to positioning  - sore throat or hoarseness - Damage to heart, brain, nerves, lungs, other parts of body or loss of life  Patient voiced understanding and assent.)       Anesthesia Quick Evaluation

## 2023-07-12 NOTE — Discharge Instructions (Addendum)
Your surgeon has performed an operation on the nerves in your upper extremity. Many times, patients feel better immediately after surgery and can "overdo it." Even if you feel well, it is important that you follow these activity guidelines. If you do not let your nerve heal properly from the surgery, you can increase the chance of return of your symptoms. The following are instructions to help in your recovery once you have been discharged from the hospital.  It is normal for your nerves to feel increased "tingling" after surgery or a sensation that the nerve is "waking up".  This generally will resolve in a short period of time, within 1 to 2 weeks.  * It is ok to take NSAIDs after surgery.  Activity    Increase physical activity slowly as tolerated.  Taking short walks is encouraged, but avoid strenuous exercise. Do not jog, run, bicycle, lift weights, or participate in any other exercises unless specifically allowed by your doctor. Avoid prolonged sitting, including car rides.  For the first few days after surgery, avoid any use of the extremity more than a glass of water or for eating.  Specific instructions were given to you based off of your specific procedure.  You should not drive until no longer taking any new pain medication.  You may shower three days after your surgery.  After showering, lightly dab your incision dry. Do not take a tub bath or go swimming for 3 weeks, or until approved by your doctor at your follow-up appointment.  If you smoke, we strongly recommend that you quit.  Smoking has been proven to interfere with normal healing in your back and will dramatically reduce the success rate of your surgery. Please contact [facility] (800-QUIT-NOW) and use the resources at www.[facility].com for assistance in stopping smoking.  Surgical Incision   If you have a dressing on your incision, you may remove it three days after your surgery. Keep your incision area clean and dry.  Your  sutures are under your skin and your wound is covered with surgical glue.  The glue should begin to peel away within about a week.   Diet            You may return to your usual diet. Be sure to stay hydrated.  When to Contact us  Although your surgery and recovery will likely be uneventful, you may have some residual numbness, aches, and pains in your back and/or legs. This is normal and should improve in the next few weeks.  However, should you experience any of the following, contact us immediately: New numbness or weakness Pain that is progressively getting worse, and is not relieved by your pain medications or rest Bleeding, redness, swelling, pain, or drainage from surgical incision Chills or flu-like symptoms Fever greater than 101.0 F (38.3 C) Problems with bowel or bladder functions Difficulty breathing or shortness of breath Warmth, tenderness, or swelling in your calf  Contact Information How to contact us:  If you have any questions/concerns before or after surgery, you can reach [place] at [phone], or you can send a mychart message. We can be reached by phone or mychart 8am-4pm, Monday-Friday.  *Please note: Calls after 4pm are forwarded to a third party answering service. Mychart messages are not routinely monitored during evenings, weekends, and holidays. Please call our office to contact the answering service for urgent concerns during non-business hours.

## 2023-07-12 NOTE — Interval H&P Note (Signed)
History and Physical Interval Note:  07/12/2023 6:54 AM  Jared Hess  has presented today for surgery, with the diagnosis of G56.01 right carpal tunnel syndrome.  The various methods of treatment have been discussed with the patient and family. After consideration of risks, benefits and other options for treatment, the patient has consented to  Procedure(s): RIGHT CARPAL TUNNEL RELEASE WITH ULTRASOUND GUIDANCE (Right) as a surgical intervention.  The patient's history has been reviewed, patient examined, no change in status, stable for surgery.  I have reviewed the patient's chart and labs.  Questions were answered to the patient's satisfaction.    Heart and lungs clear   Lovenia Kim

## 2023-07-12 NOTE — Op Note (Signed)
Indications: Patient with a history of median neuropathy at the wrist with hand weakness refractory to conservative management.  Findings: Severe compression of the median nerve at the transverse carpal ligament  Preoperative Diagnosis:    Postoperative Diagnosis: same   EBL: Minimal IVF: See anesthesia report Drains: none Disposition:Stable to PACU Complications: none  No foley catheter was placed.   Preoperative Note: patient with a history of progressive right median neuropathy with hand weakness refractory to conservative management.  They had tried rest, padding, and watchful waiting but had continued progressive symptoms.  Given the progression of her median neuropathy plan was made for median nerve decompression  Risk of surgery is discussed and include: Infection, bleeding, wound healing issues, pillar pain nerve injury, pain, failure to relieve the symptoms, need for further surgery.  Procedure:  1) right sided carpal tunnel decompression with ultrasound guidance   Procedure: After obtaining informed consent, the patient taken to the operating room, placed in supine position, monitored anesthesia care was induced.  They were given preoperative antibiotics.  Prepped and draped in the usual fashion.  Comprehensive timeout was performed verifying the patient's name, MRN, planned procedure.  An ultrasound was used with a sterile probe.  We used this to mark out our safety points including the interval between the ulnar artery and median nerve.  We identified the motor branch as well as the first sensory branch which were both in safe position.  We also identified the vascular arcade which was in safe positioning as well.  At this point we placed a block with Marcaine without epinephrine.  We blocked the skin where the incision would be, the superficial sensory median nerve, as well as the transverse carpal ligament.  Under ultrasound guidance we utilized the local anesthetic to  perform a hydrodissection of the nerve from the transverse carpal ligament.  We then prepped the sonex ultra CTR knife on the back table while the anesthetic set in.  We performed a small linear incision approximately 2 to 3 mm.  We then utilized a Statistician under ultrasound guidance to identify the underside of the transverse carpal ligament.  The fat and connective tissue was dissected off the underside.  We could feel that this was quite thickened and calcified.  Causing severe compression.  Once we had a clear tract we then placed the ultrasound-guided knife into the incision and advanced it through the carpal tunnel.  We verified the safety zones including the median nerve which did not significantly cross over the knife.  We are able to see the first sensory branch which was not crossing the knife.  The artery was also in a safe place.  At this point we divided the transverse carpal ligament under ultrasound guidance, to get a full release it took 2 passes.  The nerve relaxed laterally and was well decompressed.  After the 2 passes we placed a Penfield 4 into the wound and were able to feel a complete dissection of the transverse carpal ligament.  We then irrigated, we got meticulous hemostasis.  Skin glue was placed on the incision and a Band-Aid was placed on top once this was dried.  No immediate complications.  Sponge and pattie counts were correct at the end of the procedure.   I performed the procedure without an assistant surgeon  Lovenia Kim, MD/MSCR

## 2023-07-12 NOTE — Anesthesia Postprocedure Evaluation (Signed)
Anesthesia Post Note  Patient: Jared Hess  Procedure(s) Performed: RIGHT CARPAL TUNNEL RELEASE WITH ULTRASOUND GUIDANCE (Right)  Patient location during evaluation: PACU Anesthesia Type: General Level of consciousness: awake and alert Pain management: pain level controlled Vital Signs Assessment: post-procedure vital signs reviewed and stable Respiratory status: spontaneous breathing, nonlabored ventilation, respiratory function stable and patient connected to nasal cannula oxygen Cardiovascular status: blood pressure returned to baseline and stable Postop Assessment: no apparent nausea or vomiting Anesthetic complications: no   No notable events documented.   Last Vitals:  Vitals:   07/12/23 0800 07/12/23 0815  BP: 121/87 136/88  Pulse: 66 60  Resp: 17 16  Temp:  36.8 C  SpO2: 97% 99%    Last Pain:  Vitals:   07/12/23 0815  TempSrc:   PainSc: 0-No pain                 Louie Boston

## 2023-07-12 NOTE — Transfer of Care (Signed)
Immediate Anesthesia Transfer of Care Note  Patient: Jared Hess  Procedure(s) Performed: RIGHT CARPAL TUNNEL RELEASE WITH ULTRASOUND GUIDANCE (Right)  Patient Location: PACU  Anesthesia Type:General  Level of Consciousness: awake, alert , oriented, and patient cooperative  Airway & Oxygen Therapy: Patient Spontanous Breathing  Post-op Assessment: Report given to RN and Post -op Vital signs reviewed and stable  Post vital signs: Reviewed and stable  Last Vitals:  Vitals Value Taken Time  BP 122/79 07/12/23 0748  Temp 97.1   Pulse 62 07/12/23 0752  Resp 13 07/12/23 0752  SpO2 95 % 07/12/23 0752  Vitals shown include unfiled device data.  Last Pain:  Vitals:   07/12/23 0626  TempSrc: Temporal  PainSc: 0-No pain         Complications: No notable events documented.

## 2023-07-13 ENCOUNTER — Encounter: Payer: Self-pay | Admitting: Neurosurgery

## 2023-07-27 ENCOUNTER — Ambulatory Visit (INDEPENDENT_AMBULATORY_CARE_PROVIDER_SITE_OTHER): Payer: Medicare Other | Admitting: Physician Assistant

## 2023-07-27 ENCOUNTER — Encounter: Payer: Self-pay | Admitting: Physician Assistant

## 2023-07-27 VITALS — BP 132/78 | Temp 97.7°F

## 2023-07-27 DIAGNOSIS — G5603 Carpal tunnel syndrome, bilateral upper limbs: Secondary | ICD-10-CM

## 2023-07-27 DIAGNOSIS — Z9889 Other specified postprocedural states: Secondary | ICD-10-CM

## 2023-07-27 DIAGNOSIS — G5601 Carpal tunnel syndrome, right upper limb: Secondary | ICD-10-CM

## 2023-07-27 NOTE — Progress Notes (Signed)
   REFERRING PHYSICIAN:  Lauro Regulus, Md [address] [facility] [place],  [place] [address]  DOS: 07/12/23, Right carpal tunnel release  HISTORY OF PRESENT ILLNESS: Juma Oxley is 2 weeks  status post right carpal tunnel release. Overall, he is doing very well. He has less pain in his right hand. He does still have some residual numbness.He is eager to have his Left carpal tunnel release on 08/09/23.  PHYSICAL EXAMINATION:  NEUROLOGICAL:  General: In no acute distress.   Awake, alert, oriented to person, place, and time.  Pupils equal round and reactive to light.  Facial tone is symmetric.  Tongue protrusion is midline.  There is no pronator drift.  Strength:  He has thenar wasting bilaterally. Severe weakness in thumb abduction, significant weakness noted in thumb opposition. Weakness in his median lumbricals. Thumb weakness is approximately 2-3 out of 5, lumbrical weakness is 4 out of 5,   Incision c/d/I, small scab noted    Assessment / Plan: Demontay Grantham is 2 weeks  status post right carpal tunnel release. Overall, he is doing very well. He has less pain in his right hand. He does still have some residual numbness.He is eager to have his Left carpal tunnel release on 08/09/23.   Advised to contact the office if any questions or concerns arise.   Joan Flores PA-C Dept of Neurosurgery

## 2023-08-01 ENCOUNTER — Encounter
Admission: RE | Admit: 2023-08-01 | Discharge: 2023-08-01 | Disposition: A | Discharge status: Home / Self Care | Payer: Medicare Other | Source: Ambulatory Visit | Attending: Neurosurgery | Admitting: Neurosurgery

## 2023-08-01 ENCOUNTER — Other Ambulatory Visit: Payer: Self-pay

## 2023-08-01 VITALS — Ht 73.5 in | Wt 182.0 lb

## 2023-08-01 DIAGNOSIS — Z01812 Encounter for preprocedural laboratory examination: Secondary | ICD-10-CM

## 2023-08-01 DIAGNOSIS — E109 Type 1 diabetes mellitus without complications: Secondary | ICD-10-CM

## 2023-08-01 NOTE — Patient Instructions (Addendum)
 Your procedure is scheduled [id number] February 25 Report to the Registration Desk on the 1st floor of the [facility] To find out your arrival time, please call [phone] between 1PM - 3PM on:  Monday, February  24 If your arrival time is 6:00 am, do not arrive before that time as the [facility] entrance doors do not open until 6:00 am.  REMEMBER: Instructions that are not followed completely may result in serious medical risk, up to and including death; or upon the discretion of your surgeon and anesthesiologist your surgery may need to be rescheduled.  Do not eat food after midnight the night before surgery.  No gum chewing or hard candies.  You may however, drink WATER up to 2 hours before you are scheduled to arrive for your surgery. Do not drink anything within 2 hours of your scheduled arrival time.   One week prior to surgery:Starting Tuesday February 18  Stop Anti-inflammatories (NSAIDS) such as Advil, Aleve, Ibuprofen, Motrin, Naproxen, Naprosyn and Aspirin based products such as Excedrin, Goody's Powder, BC Powder. Stop ANY OVER THE COUNTER supplements until after surgery. Cyanocobalamin (VITAMIN B-12)  Lion mane  You may however, continue to take Tylenol if needed for pain up until the day of surgery.  **Follow guidelines for insulin and diabetes medications.** insulin lispro (ADMELOG SOLOSTAR) Take as usual Monday February 24, Do not take the morning of surgery TRESIBA FLEXTOUCH Do not take the morning of surgery Tuesday February 25   Continue taking all of your other prescription medications up until the day of surgery.  ON THE DAY OF SURGERY ONLY TAKE THESE MEDICATIONS WITH SIPS OF WATER:  levothyroxine (SYNTHROID)    No Alcohol for 24 hours before or after surgery.  No Smoking including e-cigarettes for 24 hours before surgery.  No chewable tobacco products for at least 6 hours before surgery.  No nicotine patches on the day of surgery.  Do not use any  "recreational" drugs for at least a week (preferably 2 weeks) before your surgery.  Please be advised that the combination of cocaine and anesthesia may have negative outcomes, up to and including death. If you test positive for cocaine, your surgery will be cancelled.  On the morning of surgery brush your teeth with toothpaste and water, you may rinse your mouth with mouthwash if you wish. Do not swallow any toothpaste or mouthwash.  Use CHG Soap as directed on instruction sheet.  Do not wear jewelry, make-up, hairpins, clips or nail polish.  For welded (permanent) jewelry: bracelets, anklets, waist bands, etc.  Please have this removed prior to surgery.  If it is not removed, there is a chance that hospital personnel will need to cut it off on the day of surgery.  Do not wear lotions, powders, or perfumes.   Do not shave body hair from the neck down 48 hours before surgery.  Contact lenses, hearing aids and dentures may not be worn into surgery.  Do not bring valuables to the hospital. [facility] is not responsible for any missing/lost belongings or valuables.   Notify your doctor if there is any change in your medical condition (cold, fever, infection).  Wear comfortable clothing (specific to your surgery type) to the hospital.  After surgery, you can help prevent lung complications by doing breathing exercises.  Take deep breaths and cough every 1-2 hours.    If you are being discharged the day of surgery, you will not be allowed to drive home. You will need a  responsible individual to drive you home and stay with you for 24 hours after surgery.   If you are taking public transportation, you will need to have a responsible individual with you.  Please call the Pre-admissions Testing Dept. at [phone] if you have any questions about these instructions.  Surgery Visitation Policy:  Patients having surgery or a procedure may have two visitors.  Children under the age of  38 must have an adult with them who is not the patient.  Temporary Visitor Restrictions Due to increasing cases of flu, RSV and COVID-19: Children ages 19 and under will not be able to visit patients in [facility] hospitals under most circumstances.

## 2023-08-09 ENCOUNTER — Other Ambulatory Visit: Payer: Self-pay

## 2023-08-09 ENCOUNTER — Encounter
Admission: RE | Disposition: A | Discharge status: Home / Self Care | Payer: Self-pay | Source: Home / Self Care | Attending: Neurosurgery

## 2023-08-09 ENCOUNTER — Ambulatory Visit: Payer: Self-pay | Admitting: Anesthesiology

## 2023-08-09 ENCOUNTER — Encounter: Payer: Self-pay | Admitting: Neurosurgery

## 2023-08-09 ENCOUNTER — Ambulatory Visit
Admission: RE | Admit: 2023-08-09 | Discharge: 2023-08-09 | Disposition: A | Discharge status: Home / Self Care | Payer: Medicare Other | Attending: Neurosurgery | Admitting: Neurosurgery

## 2023-08-09 DIAGNOSIS — N183 Chronic kidney disease, stage 3 unspecified: Secondary | ICD-10-CM | POA: Diagnosis not present

## 2023-08-09 DIAGNOSIS — Z01812 Encounter for preprocedural laboratory examination: Secondary | ICD-10-CM

## 2023-08-09 DIAGNOSIS — G5603 Carpal tunnel syndrome, bilateral upper limbs: Secondary | ICD-10-CM | POA: Diagnosis present

## 2023-08-09 DIAGNOSIS — E1022 Type 1 diabetes mellitus with diabetic chronic kidney disease: Secondary | ICD-10-CM | POA: Diagnosis not present

## 2023-08-09 DIAGNOSIS — G5602 Carpal tunnel syndrome, left upper limb: Secondary | ICD-10-CM | POA: Diagnosis not present

## 2023-08-09 DIAGNOSIS — Z01818 Encounter for other preprocedural examination: Secondary | ICD-10-CM

## 2023-08-09 DIAGNOSIS — E109 Type 1 diabetes mellitus without complications: Secondary | ICD-10-CM

## 2023-08-09 HISTORY — PX: CARPAL TUNNEL RELEASE: SHX101

## 2023-08-09 LAB — GLUCOSE, CAPILLARY
Glucose-Capillary: 92 mg/dL (ref 70–99)
Glucose-Capillary: 68 mg/dL — ABNORMAL LOW (ref 70–99)
Glucose-Capillary: 80 mg/dL (ref 70–99)
Glucose-Capillary: 81 mg/dL (ref 70–99)

## 2023-08-09 SURGERY — CARPAL TUNNEL RELEASE
Anesthesia: General | Site: Wrist | Laterality: Left

## 2023-08-09 MED ORDER — GLYCOPYRROLATE 0.2 MG/ML IJ SOLN
INTRAMUSCULAR | Status: DC | PRN
  Administered 2023-08-09: .2 mg via INTRAVENOUS

## 2023-08-09 MED ORDER — MIDAZOLAM HCL 2 MG/2ML IJ SOLN
INTRAMUSCULAR | Status: DC | PRN
  Administered 2023-08-09: 2 mg via INTRAVENOUS

## 2023-08-09 MED ORDER — SENNA 8.6 MG PO TABS: 1.0000 | ORAL_TABLET | Freq: Every day | ORAL | 0 refills | Status: DC

## 2023-08-09 MED ORDER — DEXAMETHASONE SODIUM PHOSPHATE 10 MG/ML IJ SOLN
INTRAMUSCULAR | Status: DC | PRN
  Administered 2023-08-09: 5 mg via INTRAVENOUS

## 2023-08-09 MED ORDER — LIDOCAINE HCL (PF) 1 % IJ SOLN
INTRAMUSCULAR | Status: AC
  Filled 2023-08-09: qty 30

## 2023-08-09 MED ORDER — ACETAMINOPHEN 500 MG PO TABS: 500.0000 mg | ORAL_TABLET | Freq: Four times a day (QID) | ORAL | 0 refills | Status: DC | PRN

## 2023-08-09 MED ORDER — FENTANYL CITRATE (PF) 100 MCG/2ML IJ SOLN
INTRAMUSCULAR | Status: AC
  Filled 2023-08-09: qty 2

## 2023-08-09 MED ORDER — MIDAZOLAM HCL 2 MG/2ML IJ SOLN
INTRAMUSCULAR | Status: AC
  Filled 2023-08-09: qty 2

## 2023-08-09 MED ORDER — PROPOFOL 10 MG/ML IV BOLUS
INTRAVENOUS | Status: DC | PRN
  Administered 2023-08-09: 50 mg via INTRAVENOUS

## 2023-08-09 MED ORDER — ORAL CARE MOUTH RINSE: 15.0000 mL | Freq: Once | OROMUCOSAL | Status: AC

## 2023-08-09 MED ORDER — OXYCODONE HCL 5 MG PO TABS: 5.0000 mg | ORAL_TABLET | Freq: Once | ORAL | Status: DC | PRN

## 2023-08-09 MED ORDER — DOCUSATE SODIUM 100 MG PO CAPS: 100.0000 mg | ORAL_CAPSULE | Freq: Two times a day (BID) | ORAL | 0 refills | Status: DC

## 2023-08-09 MED ORDER — ONDANSETRON HCL 4 MG/2ML IJ SOLN
INTRAMUSCULAR | Status: DC | PRN
  Administered 2023-08-09: 4 mg via INTRAVENOUS

## 2023-08-09 MED ORDER — TRAMADOL HCL 50 MG PO TABS: 50.0000 mg | ORAL_TABLET | Freq: Four times a day (QID) | ORAL | 0 refills | Status: DC | PRN

## 2023-08-09 MED ORDER — CEFAZOLIN SODIUM-DEXTROSE 2-4 GM/100ML-% IV SOLN
2.0000 g | INTRAVENOUS | Status: DC
Start: 2023-08-09 — End: 2023-08-09

## 2023-08-09 MED ORDER — SODIUM CHLORIDE 0.9 % IV SOLN: INTRAVENOUS | Status: DC

## 2023-08-09 MED ORDER — CHLORHEXIDINE GLUCONATE 0.12 % MT SOLN
OROMUCOSAL | Status: AC
  Filled 2023-08-09: qty 15

## 2023-08-09 MED ORDER — 0.9 % SODIUM CHLORIDE (POUR BTL) OPTIME
TOPICAL | Status: DC | PRN
  Administered 2023-08-09: 500 mL

## 2023-08-09 MED ORDER — FENTANYL CITRATE (PF) 100 MCG/2ML IJ SOLN: 25.0000 ug | INTRAMUSCULAR | Status: DC | PRN

## 2023-08-09 MED ORDER — CEFAZOLIN IN SODIUM CHLORIDE 2-0.9 GM/100ML-% IV SOLN
2.0000 g | Freq: Once | INTRAVENOUS | Status: DC
  Filled 2023-08-09: qty 100

## 2023-08-09 MED ORDER — DEXMEDETOMIDINE HCL IN NACL 200 MCG/50ML IV SOLN
INTRAVENOUS | Status: DC | PRN
  Administered 2023-08-09: 12 ug via INTRAVENOUS

## 2023-08-09 MED ORDER — LIDOCAINE HCL (CARDIAC) PF 100 MG/5ML IV SOSY
PREFILLED_SYRINGE | INTRAVENOUS | Status: DC | PRN
  Administered 2023-08-09: 50 mg via INTRAVENOUS

## 2023-08-09 MED ORDER — OXYCODONE HCL 5 MG/5ML PO SOLN: 5.0000 mg | Freq: Once | ORAL | Status: DC | PRN

## 2023-08-09 MED ORDER — CEFAZOLIN SODIUM-DEXTROSE 2-4 GM/100ML-% IV SOLN
INTRAVENOUS | Status: AC
  Filled 2023-08-09: qty 100

## 2023-08-09 MED ORDER — PROPOFOL 1000 MG/100ML IV EMUL
INTRAVENOUS | Status: AC
  Filled 2023-08-09: qty 100

## 2023-08-09 MED ORDER — CHLORHEXIDINE GLUCONATE 0.12 % MT SOLN
15.0000 mL | Freq: Once | OROMUCOSAL | Status: AC
  Administered 2023-08-09: 15 mL via OROMUCOSAL

## 2023-08-09 MED ORDER — PROPOFOL 500 MG/50ML IV EMUL
INTRAVENOUS | Status: DC | PRN
  Administered 2023-08-09: 145 ug/kg/min via INTRAVENOUS

## 2023-08-09 MED ORDER — LIDOCAINE HCL 1 % IJ SOLN
INTRAMUSCULAR | Status: DC | PRN
  Administered 2023-08-09: 3 mL

## 2023-08-09 SURGICAL SUPPLY — 21 items
BNDG ADH 1X3 SHEER STRL LF (GAUZE/BANDAGES/DRESSINGS) ×1 IMPLANT
BNDG ADH 2 X3.75 FABRIC TAN LF (GAUZE/BANDAGES/DRESSINGS) IMPLANT
BRUSH SCRUB EZ 4% CHG (MISCELLANEOUS) ×1 IMPLANT
CHLORAPREP W/TINT 26 (MISCELLANEOUS) ×1 IMPLANT
COVER PROBE FLX POLY STRL (MISCELLANEOUS) ×1 IMPLANT
DERMABOND ADVANCED .7 DNX12 (GAUZE/BANDAGES/DRESSINGS) ×1 IMPLANT
DRAPE EXTREMITY 106X87X128.5 (DRAPES) ×1 IMPLANT
DRAPE IMP U-DRAPE 54X76 (DRAPES) ×1 IMPLANT
DRAPE SHEET LG 3/4 BI-LAMINATE (DRAPES) ×1 IMPLANT
GLOVE BIOGEL PI IND STRL 8 (GLOVE) ×1 IMPLANT
GLOVE SRG 8 PF TXTR STRL LF DI (GLOVE) ×1 IMPLANT
GLOVE SURG SYN 7.5 E (GLOVE) ×1 IMPLANT
GLOVE SURG SYN 7.5 PF PI (GLOVE) ×1 IMPLANT
GOWN SRG XL LVL 3 NONREINFORCE (GOWNS) ×1 IMPLANT
KIT TURNOVER KIT A (KITS) ×1 IMPLANT
NDL HYPO 25X1 1.5 SAFETY (NEEDLE) ×1 IMPLANT
NEEDLE HYPO 25X1 1.5 SAFETY (NEEDLE) ×1 IMPLANT
NS IRRIG 500ML POUR BTL (IV SOLUTION) ×1 IMPLANT
PACK BASIN MINOR ARMC (MISCELLANEOUS) ×1 IMPLANT
STOCKINETTE IMPERVIOUS 9X36 MD (GAUZE/BANDAGES/DRESSINGS) ×1 IMPLANT
ULTRAGLIDE CTR (BLADE) ×1 IMPLANT

## 2023-08-09 NOTE — Anesthesia Preprocedure Evaluation (Signed)
 Anesthesia Evaluation  Patient identified by MRN, date of birth, ID band Patient awake    Reviewed: Allergy & Precautions, NPO status , Patient's Chart, lab work & pertinent test results  History of Anesthesia Complications Negative for: history of anesthetic complications  Airway Mallampati: III  TM Distance: >3 FB Neck ROM: full    Dental  (+) Chipped   Pulmonary neg pulmonary ROS, neg shortness of breath   Pulmonary exam normal        Cardiovascular Exercise Tolerance: Good (-) angina (-) Past MI and (-) DOE negative cardio ROS Normal cardiovascular exam     Neuro/Psych  PSYCHIATRIC DISORDERS       Neuromuscular disease    GI/Hepatic negative GI ROS, Neg liver ROS,,,  Endo/Other  diabetesHypothyroidism    Renal/GU Renal disease  negative genitourinary   Musculoskeletal   Abdominal   Peds  Hematology negative hematology ROS (+)   Anesthesia Other Findings Past Medical History: No date: Bilateral carpal tunnel syndrome No date: Chronic bilateral low back pain without sciatica No date: CKD (chronic kidney disease) stage 3, GFR 30-59 ml/min (HCC) No date: Dementia (HCC) No date: DM (diabetes mellitus), type 1 (HCC) No date: ED (erectile dysfunction) No date: Hypothyroidism No date: Neuropathy No date: Prostate cancer (HCC) No date: Thyroid disease  Past Surgical History: 07/14/2021: BICEPT TENODESIS; Right     Comment:  Procedure: BICEPS TENODESIS;  Surgeon: Juanell Fairly,              MD;  Location: [facility] ORS;  Service: Orthopedics;                Laterality: Right; 07/12/2023: CARPAL TUNNEL RELEASE; Right     Comment:  Procedure: RIGHT CARPAL TUNNEL RELEASE WITH ULTRASOUND               GUIDANCE;  Surgeon: Lovenia Kim, MD;  Location: [facility]              ORS;  Service: Neurosurgery;  Laterality: Right; No date: FOOT SURGERY; Left No date: LUMBAR FUSION 2010: PROSTATE CRYOABLATION 07/14/2021:  SHOULDER ARTHROSCOPY WITH OPEN ROTATOR CUFF REPAIR AND  DISTAL CLAVICLE ACROMINECTOMY; Right     Comment:  Procedure: SHOULDER ARTHROSCOPY WITH OPEN ROTATOR CUFF               REPAIR AND DISTAL CLAVICLE ACROMINECTOMY;  Surgeon:               Juanell Fairly, MD;  Location: [facility] ORS;  Service:               Orthopedics;  Laterality: Right; 2002: shoulder blade surgery  BMI    Body Mass Index: 23.69 kg/m      Reproductive/Obstetrics negative OB ROS                             Anesthesia Physical Anesthesia Plan  ASA: 3  Anesthesia Plan: General   Post-op Pain Management:    Induction: Intravenous  PONV Risk Score and Plan: Propofol infusion and TIVA  Airway Management Planned: Natural Airway and Nasal Cannula  Additional Equipment:   Intra-op Plan:   Post-operative Plan:   Informed Consent: I have reviewed the patients History and Physical, chart, labs and discussed the procedure including the risks, benefits and alternatives for the proposed anesthesia with the patient or authorized representative who has indicated his/her understanding and acceptance.     Dental Advisory Given  Plan Discussed with:  Anesthesiologist, CRNA and Surgeon  Anesthesia Plan Comments: (Patient consented for risks of anesthesia including but not limited to:  - adverse reactions to medications - risk of airway placement if required - damage to eyes, teeth, lips or other oral mucosa - nerve damage due to positioning  - sore throat or hoarseness - Damage to heart, brain, nerves, lungs, other parts of body or loss of life  Patient voiced understanding and assent.)       Anesthesia Quick Evaluation

## 2023-08-09 NOTE — H&P (View-Only) (Signed)
 Referring Physician:  No referring provider defined for this encounter.  Primary Physician:  Lauro Regulus, MD  History of Present Illness: 08/09/2023 [name] is here today with a chief complaint of severe bilateral carpal tunnel syndrome.  He has been dealing with this for approximately 8 months.  Feels like is progressively worsening.  He has noticed weakness in his bilateral hands and has started to use his pinky and ring finger to open objects.  He has noticed a loss of sensation in his lateral 4 fingers.  He has also noticed severe burning and tingling in his bilateral hands in the median distribution.  Often wakes from sleep has to squeeze his hands and ring out his hands to make them feel better.  He has tried sleeping with braces which did not help.  He has not had any injections.  He had a recent EMG with bilateral severe carpal tunnel syndrome.  Referred for evaluation for decompression.  Conservative measures:  Physical therapy: No patient has been doing exercises at home not seen by therapy Occupational Therapy: No Hand Therapy: No Injections: No  Gabapentin: yes , Lyrica: No, Cymbalta: No Past Surgery: No   Review of Systems:  A 10 point review of systems is negative, except for the pertinent positives and negatives detailed in the HPI.  Past Medical History: Past Medical History:  Diagnosis Date   Bilateral carpal tunnel syndrome    Chronic bilateral low back pain without sciatica    CKD (chronic kidney disease) stage 3, GFR 30-59 ml/min (HCC)    Dementia (HCC)    DM (diabetes mellitus), type 1 (HCC)    ED (erectile dysfunction)    Hypothyroidism    Neuropathy    Prostate cancer (HCC)    Thyroid disease     Past Surgical History: Past Surgical History:  Procedure Laterality Date   BICEPT TENODESIS Right 07/14/2021   Procedure: BICEPS TENODESIS;  Surgeon: Juanell Fairly, MD;  Location: [facility] ORS;  Service: Orthopedics;  Laterality: Right;    CARPAL TUNNEL RELEASE Right 07/12/2023   Procedure: RIGHT CARPAL TUNNEL RELEASE WITH ULTRASOUND GUIDANCE;  Surgeon: Lovenia Kim, MD;  Location: [facility] ORS;  Service: Neurosurgery;  Laterality: Right;   FOOT SURGERY Left    LUMBAR FUSION     PROSTATE CRYOABLATION  2010   SHOULDER ARTHROSCOPY WITH OPEN ROTATOR CUFF REPAIR AND DISTAL CLAVICLE ACROMINECTOMY Right 07/14/2021   Procedure: SHOULDER ARTHROSCOPY WITH OPEN ROTATOR CUFF REPAIR AND DISTAL CLAVICLE ACROMINECTOMY;  Surgeon: Juanell Fairly, MD;  Location: [facility] ORS;  Service: Orthopedics;  Laterality: Right;   shoulder blade surgery  2002    Allergies: Allergies as of 06/27/2023 - Review Complete 06/27/2023  Allergen Reaction Noted   Armoracia rusticana ext (horseradish) Itching, Nausea And Vomiting, Nausea Only, and Swelling 04/06/2021   Tree extract Itching, Anaphylaxis, and Shortness Of Breath 04/06/2021   Tomato Nausea And Vomiting, Nausea Only, and Swelling 06/27/2023    Medications:  Current Facility-Administered Medications:    0.9 %  sodium chloride infusion, , Intravenous, Continuous, Corinda Gubler, MD   ceFAZolin (ANCEF) IVPB 2g/100 mL premix, 2 g, Intravenous, 60 min Pre-Op, Lovenia Kim, MD  Social History: Social History   Tobacco Use   Smoking status: Never   Smokeless tobacco: Never  Vaping Use   Vaping status: Never Used  Substance Use Topics   Alcohol use: Not Currently   Drug use: No    Family Medical History: Family History  Problem Relation Age of  Onset   Prostate cancer Neg Hx    Bladder Cancer Neg Hx    Kidney cancer Neg Hx     Physical Examination: Vitals:   08/09/23 0629  BP: (!) 144/92  Pulse: 68  Resp: 16  Temp: 97.8 F (36.6 C)  SpO2: 98%    General: Patient is in no apparent distress. Attention to examination is appropriate.  Neck:   Supple.  Full range of motion.  Respiratory: Patient is breathing without any difficulty.   NEUROLOGICAL:     Awake, alert, oriented  to person, place, and time.  Speech is clear and fluent.   Cranial Nerves: Pupils equal round and reactive to light.  Facial tone is symmetric. Shoulder shrug is symmetric. Tongue protrusion is midline.  There is no pronator drift.  Motor Exam:  He has thenar wasting bilaterally.  Severe weakness in thumb abduction, significant weakness noted in thumb opposition.  Weakness in his median lumbricals.  Thumb weakness is approximately 2-3 out of 5, lumbrical weakness is 4 out of 5,  Ulnar sensation is intact bilaterally, median sensation severely decreased with increased dysesthesias noted in the median nerve distribution.  Positive Tinel sign, patient with constant tingling in his median distribution, somewhat mildly worsened with Phalen's, reverse Phalen's, and carpal compression test.   Medical Decision Making  Electrodiagnostics: Jolene Provost, MD - 05/31/2023 4:00 PM EST Formatting of this note is different from the original. Images from the original note were not included. [facility] - Neurology Department [address] [place], [place] [address] [phone] (Phone); [phone] (Fax) Test Date: 05/31/2023  Patient: Jared Hess DOB: October 10, 1950 Physician: Dr. Cristopher Peru Chart#: [id number] Sex: Male Ref Phys: Dr. Cristopher Peru  Patient History: Patient is a 73 year-old male who presents with bilateral arm numbness and tingling from the elbow down. Has bilateral hand weakness, numbness. Denies neck pain. 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 Past medical history is significant for diabetes, cancer, and thyroid problems. Back surgery.  Exam: Sensation testing revealed. Decrease sensation to light touch in bilateral median distribution and splits 4th digit bilaterally, strength 5/5 and DTR symmetric.  EMG & NCV Findings: Evaluation of the Left median motor, the Right median motor, the Left median sensory, and the Right median  sensory nerves showed no response (Wrist). The Right ulnar sensory nerve showed reduced amplitude (5.4 V) and decreased conduction velocity (Wrist-5th Digit, 36 m/s). The Left median/ulnar (palm) comparison and the Right median/ulnar (palm) comparison nerves showed no response (Median Palm). All remaining nerves (as indicated in the following tables) were within normal limits. EMG  Side Muscle Nerve Root Ins Act Fibs Psw Amp Dur Poly Recrt Int Dennie Bible Comment Left Abd Poll Brev Median C8-T1 Nml Nml Nml Incr >46ms 1+ mild Reduced Nml Left 1stDorInt Ulnar C8-T1 Nml Nml Nml Nml Nml 0 Nml Nml Left ExtDigCom Radial (Post Int) C7-8 Nml Nml Nml Nml Nml 0 Nml Nml Left PronatorTeres Median C6-7 Nml Nml Nml Nml Nml 0 Nml Nml Right Abd Poll Brev Median C8-T1 Nml Nml Nml Incr >34ms 1+ mild Reduced Nml Right 1stDorInt Ulnar C8-T1 Nml Nml Nml Nml Nml 0 Nml Nml Right ExtDigCom Radial (Post Int) C7-8 Nml Nml Nml Nml Nml 0 Nml Nml Right PronatorTeres Median C6-7 Nml Nml Nml Nml Nml 0 Nml Nml  Impression: This is an abnormal electrodiagnostic exam consistent with bilateral severe (grade IV) carpal tunnel syndrome (median nerve entrapment at wrist).  Thank you for the referral of  this patient. It was our privilege to participate in care of your patient. Feel free to contact us with any further questions.  _____________________________ Cristopher Peru, M.D.   I have personally reviewed the images and electrodiagnostics and agree with the above interpretation.  Assessment and Plan: Mr. Unrein is a pleasant 73 y.o. male with with severe bilateral carpal tunnel syndrome.  This been progressive over the past 8 months.  He has lost thenar mass as well as strength in his hands.  He has lost significant amount of sensation in his bilateral median distribution.  He notes waking symptoms and has woken up every night having to ring out his hands.  This is severely impacting his sleep.  He is gotten a recent EMG which  demonstrated severe bilateral carpal tunnel syndrome.  Given these findings we will plan for carpal tunnel decompression.  On physical examination he has thenar wasting and weakness bilaterally.  Has a positive Tinel at the wrist bilaterally. We did the right previously will now decompress the left side  His heart and lungs are clear.   Thank you for involving me in the care of this patient.   Lovenia Kim MD/MSCR Neurosurgery - Peripheral Nerve Surgery

## 2023-08-09 NOTE — Interval H&P Note (Signed)
 History and Physical Interval Note:  08/09/2023 7:22 AM  Jared Hess  has presented today for surgery, with the diagnosis of G56.02 left carpal tunnel syndrome.  The various methods of treatment have been discussed with the patient and family. After consideration of risks, benefits and other options for treatment, the patient has consented to  Procedure(s): LEFT CARPAL TUNNEL RELEASE WITH ULTRASOUND GUIDANCE (Left) as a surgical intervention.  The patient's history has been reviewed, patient examined, no change in status, stable for surgery.  I have reviewed the patient's chart and labs.  Questions were answered to the patient's satisfaction.    Heart and lungs clear  Lovenia Kim

## 2023-08-09 NOTE — Transfer of Care (Signed)
 Immediate Anesthesia Transfer of Care Note  Patient: Jared Hess  Procedure(s) Performed: LEFT CARPAL TUNNEL RELEASE WITH ULTRASOUND GUIDANCE (Left: Wrist)  Patient Location: PACU  Anesthesia Type:General  Level of Consciousness: drowsy and patient cooperative  Airway & Oxygen Therapy: Patient Spontanous Breathing and Patient connected to face mask oxygen  Post-op Assessment: Report given to RN and Post -op Vital signs reviewed and stable  Post vital signs: Reviewed and stable  Last Vitals:  Vitals Value Taken Time  BP 111/64 08/09/23 0822  Temp    Pulse 72 08/09/23 0823  Resp 2 08/09/23 0823  SpO2 99 % 08/09/23 0823  Vitals shown include unfiled device data.  Last Pain:  Vitals:   08/09/23 0629  PainSc: 0-No pain         Complications: No notable events documented.

## 2023-08-09 NOTE — Op Note (Signed)
 Indications: Patient with a history of median neuropathy at the wrist with hand weakness refractory to conservative management.  Findings: Severe compression of the median nerve at the transverse carpal ligament  Preoperative Diagnosis: Hospital Problem List as of 08/09/2023          Priority Resolved POA     Unprioritized     * (Principal) Left carpal tunnel syndrome   Yes     Postoperative Diagnosis: same   EBL: Minimal IVF: See anesthesia report Drains: none Disposition:Stable to PACU Complications: none  No foley catheter was placed.   Preoperative Note: patient with a history of progressive left median neuropathy with hand weakness refractory to conservative management.  They had tried rest, padding, and watchful waiting but had continued progressive symptoms.  Given the progression of her median neuropathy plan was made for median nerve decompression  Risk of surgery is discussed and include: Infection, bleeding, wound healing issues, pillar pain nerve injury, pain, failure to relieve the symptoms, need for further surgery.  Procedure:  1) left sided carpal tunnel decompression with ultrasound guidance   Procedure: After obtaining informed consent, the patient taken to the operating room, placed in supine position, monitored anesthesia care was induced.  They were given preoperative antibiotics.  Prepped and draped in the usual fashion.  Comprehensive timeout was performed verifying the patient's name, MRN, planned procedure.  An ultrasound was used with a sterile probe.  We used this to mark out our safety points including the interval between the ulnar artery and median nerve.  We identified the motor branch as well as the first sensory branch which were both in safe position.  We also identified the vascular arcade which was in safe positioning as well.  At this point we placed a block with Marcaine without epinephrine.  We blocked the skin where the incision would be,  the superficial sensory median nerve, as well as the transverse carpal ligament.  Under ultrasound guidance we utilized the local anesthetic to perform a hydrodissection of the nerve from the transverse carpal ligament.  We then prepped the sonex ultra CTR knife on the back table while the anesthetic set in.  We performed a small linear incision approximately 2 to 3 mm.  We then utilized a Statistician under ultrasound guidance to identify the underside of the transverse carpal ligament.  The fat and connective tissue was dissected off the underside.  We could feel that this was quite thickened and calcified.  Causing severe compression.  Once we had a clear tract we then placed the ultrasound-guided knife into the incision and advanced it through the carpal tunnel.  We verified the safety zones including the median nerve which did not significantly cross over the knife.  We are able to see the first sensory branch which was not crossing the knife.  The artery was also in a safe place.  At this point we divided the transverse carpal ligament under ultrasound guidance, to get a full release it took 2 passes.  The nerve relaxed laterally and was well decompressed.  After the 2 passes we placed a [place] 4 into the wound and were able to feel a complete dissection of the transverse carpal ligament.  We then irrigated, we got meticulous hemostasis.  Skin glue was placed on the incision and a Band-Aid was placed on top once this was dried.  No immediate complications.  Sponge and pattie counts were correct at the end of the procedure.   I performed  the procedure without an assistant surgeon  Lovenia Kim, MD/MSCR

## 2023-08-09 NOTE — Anesthesia Procedure Notes (Signed)
 Procedure Name: General with mask airway Date/Time: 08/09/2023 8:03 AM  Performed by: Mohammed Kindle, CRNAPre-anesthesia Checklist: Patient identified, Emergency Drugs available, Suction available and Patient being monitored Patient Re-evaluated:Patient Re-evaluated prior to induction Oxygen Delivery Method: Simple face mask Induction Type: IV induction Placement Confirmation: positive ETCO2 and breath sounds checked- equal and bilateral Dental Injury: Teeth and Oropharynx as per pre-operative assessment

## 2023-08-09 NOTE — Progress Notes (Signed)
 Referring Physician:  No referring provider defined for this encounter.  Primary Physician:  Jared Regulus, MD  History of Present Illness: 08/09/2023 [name] is here today with a chief complaint of severe bilateral carpal tunnel syndrome.  He has been dealing with this for approximately 8 months.  Feels like is progressively worsening.  He has noticed weakness in his bilateral hands and has started to use his pinky and ring finger to open objects.  He has noticed a loss of sensation in his lateral 4 fingers.  He has also noticed severe burning and tingling in his bilateral hands in the median distribution.  Often wakes from sleep has to squeeze his hands and ring out his hands to make them feel better.  He has tried sleeping with braces which did not help.  He has not had any injections.  He had a recent EMG with bilateral severe carpal tunnel syndrome.  Referred for evaluation for decompression.  Conservative measures:  Physical therapy: No patient has been doing exercises at home not seen by therapy Occupational Therapy: No Hand Therapy: No Injections: No  Gabapentin: yes , Lyrica: No, Cymbalta: No Past Surgery: No   Review of Systems:  A 10 point review of systems is negative, except for the pertinent positives and negatives detailed in the HPI.  Past Medical History: Past Medical History:  Diagnosis Date   Bilateral carpal tunnel syndrome    Chronic bilateral low back pain without sciatica    CKD (chronic kidney disease) stage 3, GFR 30-59 ml/min (HCC)    Dementia (HCC)    DM (diabetes mellitus), type 1 (HCC)    ED (erectile dysfunction)    Hypothyroidism    Neuropathy    Prostate cancer (HCC)    Thyroid disease     Past Surgical History: Past Surgical History:  Procedure Laterality Date   BICEPT TENODESIS Right 07/14/2021   Procedure: BICEPS TENODESIS;  Surgeon: Jared Fairly, MD;  Location: [facility] ORS;  Service: Orthopedics;  Laterality: Right;    CARPAL TUNNEL RELEASE Right 07/12/2023   Procedure: RIGHT CARPAL TUNNEL RELEASE WITH ULTRASOUND GUIDANCE;  Surgeon: Jared Kim, MD;  Location: [facility] ORS;  Service: Neurosurgery;  Laterality: Right;   FOOT SURGERY Left    LUMBAR FUSION     PROSTATE CRYOABLATION  2010   SHOULDER ARTHROSCOPY WITH OPEN ROTATOR CUFF REPAIR AND DISTAL CLAVICLE ACROMINECTOMY Right 07/14/2021   Procedure: SHOULDER ARTHROSCOPY WITH OPEN ROTATOR CUFF REPAIR AND DISTAL CLAVICLE ACROMINECTOMY;  Surgeon: Jared Fairly, MD;  Location: [facility] ORS;  Service: Orthopedics;  Laterality: Right;   shoulder blade surgery  2002    Allergies: Allergies as of 06/27/2023 - Review Complete 06/27/2023  Allergen Reaction Noted   Armoracia rusticana ext (horseradish) Itching, Nausea And Vomiting, Nausea Only, and Swelling 04/06/2021   Tree extract Itching, Anaphylaxis, and Shortness Of Breath 04/06/2021   Tomato Nausea And Vomiting, Nausea Only, and Swelling 06/27/2023    Medications:  Current Facility-Administered Medications:    0.9 %  sodium chloride infusion, , Intravenous, Continuous, Jared Gubler, MD   ceFAZolin (ANCEF) IVPB 2g/100 mL premix, 2 g, Intravenous, 60 min Pre-Op, Jared Kim, MD  Social History: Social History   Tobacco Use   Smoking status: Never   Smokeless tobacco: Never  Vaping Use   Vaping status: Never Used  Substance Use Topics   Alcohol use: Not Currently   Drug use: No    Family Medical History: Family History  Problem Relation Age of  Onset   Prostate cancer Neg Hx    Bladder Cancer Neg Hx    Kidney cancer Neg Hx     Physical Examination: Vitals:   08/09/23 0629  BP: (!) 144/92  Pulse: 68  Resp: 16  Temp: 97.8 F (36.6 C)  SpO2: 98%    General: Patient is in no apparent distress. Attention to examination is appropriate.  Neck:   Supple.  Full range of motion.  Respiratory: Patient is breathing without any difficulty.   NEUROLOGICAL:     Awake, alert, oriented  to person, place, and time.  Speech is clear and fluent.   Cranial Nerves: Pupils equal round and reactive to light.  Facial tone is symmetric. Shoulder shrug is symmetric. Tongue protrusion is midline.  There is no pronator drift.  Motor Exam:  He has thenar wasting bilaterally.  Severe weakness in thumb abduction, significant weakness noted in thumb opposition.  Weakness in his median lumbricals.  Thumb weakness is approximately 2-3 out of 5, lumbrical weakness is 4 out of 5,  Ulnar sensation is intact bilaterally, median sensation severely decreased with increased dysesthesias noted in the median nerve distribution.  Positive Tinel sign, patient with constant tingling in his median distribution, somewhat mildly worsened with Phalen's, reverse Phalen's, and carpal compression test.   Medical Decision Making  Electrodiagnostics: Jared Provost, MD - 05/31/2023 4:00 PM EST Formatting of this note is different from the original. Images from the original note were not included. [facility] - Neurology Department [address] [place], [place] [address] [phone] (Phone); [phone] (Fax) Test Date: 05/31/2023  Patient: Jared Hess DOB: October 10, 1950 Physician: Dr. Cristopher Hess Chart#: [id number] Sex: Male Ref Phys: Dr. Cristopher Hess  Patient History: Patient is a 73 year-old male who presents with bilateral arm numbness and tingling from the elbow down. Has bilateral hand weakness, numbness. Denies neck pain. 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 Past medical history is significant for diabetes, cancer, and thyroid problems. Back surgery.  Exam: Sensation testing revealed. Decrease sensation to light touch in bilateral median distribution and splits 4th digit bilaterally, strength 5/5 and DTR symmetric.  EMG & NCV Findings: Evaluation of the Left median motor, the Right median motor, the Left median sensory, and the Right median  sensory nerves showed no response (Wrist). The Right ulnar sensory nerve showed reduced amplitude (5.4 V) and decreased conduction velocity (Wrist-5th Digit, 36 m/s). The Left median/ulnar (palm) comparison and the Right median/ulnar (palm) comparison nerves showed no response (Median Palm). All remaining nerves (as indicated in the following tables) were within normal limits. EMG  Side Muscle Nerve Root Ins Act Fibs Psw Amp Dur Poly Recrt Int Dennie Bible Comment Left Abd Poll Brev Median C8-T1 Nml Nml Nml Incr >46ms 1+ mild Reduced Nml Left 1stDorInt Ulnar C8-T1 Nml Nml Nml Nml Nml 0 Nml Nml Left ExtDigCom Radial (Post Int) C7-8 Nml Nml Nml Nml Nml 0 Nml Nml Left PronatorTeres Median C6-7 Nml Nml Nml Nml Nml 0 Nml Nml Right Abd Poll Brev Median C8-T1 Nml Nml Nml Incr >34ms 1+ mild Reduced Nml Right 1stDorInt Ulnar C8-T1 Nml Nml Nml Nml Nml 0 Nml Nml Right ExtDigCom Radial (Post Int) C7-8 Nml Nml Nml Nml Nml 0 Nml Nml Right PronatorTeres Median C6-7 Nml Nml Nml Nml Nml 0 Nml Nml  Impression: This is an abnormal electrodiagnostic exam consistent with bilateral severe (grade IV) carpal tunnel syndrome (median nerve entrapment at wrist).  Thank you for the referral of  this patient. It was our privilege to participate in care of your patient. Feel free to contact us with any further questions.  _____________________________ Jared Hess, M.D.   I have personally reviewed the images and electrodiagnostics and agree with the above interpretation.  Assessment and Plan: [name] is a pleasant 73 y.o. male with with severe bilateral carpal tunnel syndrome.  This been progressive over the past 8 months.  He has lost thenar mass as well as strength in his hands.  He has lost significant amount of sensation in his bilateral median distribution.  He notes waking symptoms and has woken up every night having to ring out his hands.  This is severely impacting his sleep.  He is gotten a recent EMG which  demonstrated severe bilateral carpal tunnel syndrome.  Given these findings we will plan for carpal tunnel decompression.  On physical examination he has thenar wasting and weakness bilaterally.  Has a positive Tinel at the wrist bilaterally. We did the right previously will now decompress the left side  His heart and lungs are clear.   Thank you for involving me in the care of this patient.   Jared Kim MD/MSCR Neurosurgery - Peripheral Nerve Surgery

## 2023-08-09 NOTE — Discharge Instructions (Signed)
  Avoid lifting objects heavier than 10 pounds (gallon milk jug).  Where possible, avoid household activities that involve lifting, bending, reaching, pushing, or pulling such as laundry, vacuuming, grocery shopping, and childcare. Try to arrange for help from friends and family for these activities while your back heals.  You should not drive until you are no longer taking narcotic pain medication and you feel safe to operate a moving vehicle.  If you are taking narcotic pain medication, please take a stool softener and laxative to avoid constipation.   You should rest at home and let your body heal.   You may remove the bandage in three days.  You may shower three days after your surgery.  After showering, lightly dab your incision dry. Do not take a tub bath or go swimming until approved by your doctor at your follow-up appointment.  If you smoke, we strongly recommend that you quit.  Smoking has been proven to interfere with wound healing will dramatically reduce the success rate of your surgery. Please contact [facility] (800-QUIT-NOW) and use the resources at www.[facility].com for assistance in stopping smoking.  Surgical Incision    The steri-strips/glue should begin to peel away within about a week. Diet           You may return to your usual diet. Be sure to stay hydrated.  When to Contact us  Contact us immediately if you have any: New numbness or weakness Pain that is progressively getting worse, and is not relieved by your pain medication, muscle relaxers, rest, and warm compresses Bleeding, redness, swelling, pain, or drainage from surgical incision Chills or flu-like symptoms Fever greater than 101.0 F (38.3 C) Inability to eat, drink fluids, or take medications Problems with bowel or bladder functions Difficulty breathing or shortness of breath Warmth, tenderness, or swelling in your calf Contact Information How to contact us:  If you have any questions/concerns  before or after surgery, you can reach [place] at [phone], or you can send a mychart message. We can be reached by phone or mychart 8am-4pm, Monday-Friday.  *Please note: Calls after 4pm are forwarded to a third party answering service. Mychart messages are not routinely monitored during evenings, weekends, and holidays. Please call our office to contact the answering service for urgent concerns during non-business hours.

## 2023-08-10 ENCOUNTER — Encounter: Payer: Self-pay | Admitting: Neurosurgery

## 2023-08-10 NOTE — Anesthesia Postprocedure Evaluation (Signed)
 Anesthesia Post Note  Patient: Jared Hess  Procedure(s) Performed: LEFT CARPAL TUNNEL RELEASE WITH ULTRASOUND GUIDANCE (Left: Wrist)  Patient location during evaluation: PACU Anesthesia Type: General Level of consciousness: awake and alert Pain management: pain level controlled Vital Signs Assessment: post-procedure vital signs reviewed and stable Respiratory status: spontaneous breathing, nonlabored ventilation, respiratory function stable and patient connected to nasal cannula oxygen Cardiovascular status: blood pressure returned to baseline and stable Postop Assessment: no apparent nausea or vomiting Anesthetic complications: no   No notable events documented.   Last Vitals:  Vitals:   08/09/23 0915 08/09/23 0916  BP:  132/88  Pulse:  67  Resp:  16  Temp: 36.8 C (!) 36.4 C  SpO2:  100%    Last Pain:  Vitals:   08/09/23 0916  TempSrc: Temporal  PainSc: 0-No pain                 Cleda Mccreedy Valgene Deloatch

## 2023-08-24 ENCOUNTER — Ambulatory Visit (INDEPENDENT_AMBULATORY_CARE_PROVIDER_SITE_OTHER): Payer: Medicare Other | Admitting: Physician Assistant

## 2023-08-24 ENCOUNTER — Encounter: Payer: Medicare Other | Admitting: Neurosurgery

## 2023-08-24 VITALS — BP 130/78 | Temp 97.7°F | Ht 73.5 in | Wt 182.0 lb

## 2023-08-24 DIAGNOSIS — G5602 Carpal tunnel syndrome, left upper limb: Secondary | ICD-10-CM

## 2023-08-24 DIAGNOSIS — R29898 Other symptoms and signs involving the musculoskeletal system: Secondary | ICD-10-CM

## 2023-08-24 DIAGNOSIS — Z09 Encounter for follow-up examination after completed treatment for conditions other than malignant neoplasm: Secondary | ICD-10-CM

## 2023-08-24 MED ORDER — METHYLPREDNISOLONE 4 MG PO TBPK: ORAL_TABLET | ORAL | 0 refills | Status: DC

## 2023-08-24 MED ORDER — GABAPENTIN 300 MG PO CAPS: 300.0000 mg | ORAL_CAPSULE | Freq: Every day | ORAL | 2 refills | Status: DC

## 2023-08-24 NOTE — Progress Notes (Signed)
   REFERRING PHYSICIAN:  Lauro Regulus, Md [address] [facility] [address] [place],  [place] [address]  DOS:   07/12/23, Right carpal tunnel release  08/09/23, left carpal tunnel release  HISTORY OF PRESENT ILLNESS: Jared Hess is  status post 6 weeks status post right carpal tunnel release, 2 weeks status post left carpal tunnel release.  Unfortunately the patient comes today with extreme pain.  He states that he noticed some increasing pain earlier this week, but overnight last night it became severe.  He was unable to sleep.  He also noticed that he was unable to move and use his hands like he was previously.  He adds that his pain was so severe he almost went to the ED.  His hands have become so weak that he was unable to put on his close today or do activities of daily living.  This is sudden and a drastic change from baseline.  PHYSICAL EXAMINATION:  NEUROLOGICAL:  General: In no acute distress.   Awake, alert, oriented to person, place, and time.  Pupils equal round and reactive to light.     Wrist drop bilaterally, R>L, 1-2 in extensors, lumbricals 2  Wrists in flexion with very little strength or movement in bilateral hands. Difficulty with pronation as well. Severe thenar wasting.  Significant weakness in median, ulnar, radial nerve distributions  Incision c/d/I    Assessment / Plan: Jared Hess  is  status post 6 weeks status post right carpal tunnel release, 2 weeks status post left carpal tunnel release.Unfortunately the patient comes today with extreme pain.  He states that he noticed some increasing pain earlier this week, but overnight last night it became severe.  He was unable to sleep.  He also noticed that he was unable to move and use his hands like he was previously.  He adds that his pain was so severe he almost went to the ED.  His hands have become so weak that he was unable to put on his close today or do activities of daily living.  This  is sudden and a drastic change from baseline.  Pleasure to see patient in clinic today.  Due to sudden severe pain with significant weakness in median, ulnar, radial nerve distributions we would like patient to undergo MRI of the bilateral brachial plexus with and without contrast as well as EMG.  There is concern for possible inflammatory or autoimmune response due to its sudden onset and differential diagnosis includes Parsonage-Turner syndrome that could have been set off due to his 2 recent surgeries.  Will review results once complete.  We have sent Medrol Dosepak and gabapentin in the meantime to help with his pain.  Advised to contact the office if any questions or concerns arise.   Joan Flores PA-C Dept of Neurosurgery

## 2023-08-28 ENCOUNTER — Ambulatory Visit
Admission: RE | Admit: 2023-08-28 | Discharge: 2023-08-28 | Disposition: A | Discharge status: Home / Self Care | Source: Ambulatory Visit | Attending: Physician Assistant | Admitting: Physician Assistant

## 2023-08-28 DIAGNOSIS — R29898 Other symptoms and signs involving the musculoskeletal system: Secondary | ICD-10-CM

## 2023-08-28 MED ORDER — GADOBUTROL 1 MMOL/ML IV SOLN
7.5000 mL | Freq: Once | INTRAVENOUS | Status: AC | PRN
  Administered 2023-08-28: 7.5 mL via INTRAVENOUS

## 2023-08-29 ENCOUNTER — Other Ambulatory Visit: Payer: Self-pay

## 2023-08-29 ENCOUNTER — Telehealth: Payer: Self-pay | Admitting: Physician Assistant

## 2023-08-29 ENCOUNTER — Encounter: Payer: Self-pay | Admitting: Neurology

## 2023-08-29 DIAGNOSIS — R911 Solitary pulmonary nodule: Secondary | ICD-10-CM

## 2023-08-29 DIAGNOSIS — R202 Paresthesia of skin: Secondary | ICD-10-CM

## 2023-08-29 NOTE — Telephone Encounter (Signed)
 Spoke with patient regarding MRI of bilateral brachial plexus.  We discussed this at length.  Today he did have an incidental finding of a lung nodule and a dedicated CT of his chest was recommended and has been ordered.  He has been taking his steroid and has likely had significant change in his pain.  He is also now able to move his hands much better.  He denies them being at full strength, but he is very happy with the significant change.  Continue plan for EMG next Monday and will review results once complete

## 2023-09-05 ENCOUNTER — Ambulatory Visit
Admission: RE | Admit: 2023-09-05 | Discharge: 2023-09-05 | Disposition: A | Discharge status: Home / Self Care | Source: Ambulatory Visit | Attending: Physician Assistant | Admitting: Physician Assistant

## 2023-09-05 ENCOUNTER — Ambulatory Visit (INDEPENDENT_AMBULATORY_CARE_PROVIDER_SITE_OTHER): Admitting: Neurology

## 2023-09-05 DIAGNOSIS — R911 Solitary pulmonary nodule: Secondary | ICD-10-CM | POA: Diagnosis present

## 2023-09-05 DIAGNOSIS — G5603 Carpal tunnel syndrome, bilateral upper limbs: Secondary | ICD-10-CM

## 2023-09-05 DIAGNOSIS — R202 Paresthesia of skin: Secondary | ICD-10-CM

## 2023-09-05 LAB — POCT I-STAT CREATININE: Creatinine, Ser: 1.5 mg/dL — ABNORMAL HIGH (ref 0.61–1.24)

## 2023-09-05 MED ORDER — IOHEXOL 300 MG/ML  SOLN
75.0000 mL | Freq: Once | INTRAMUSCULAR | Status: AC | PRN
  Administered 2023-09-05: 75 mL via INTRAVENOUS

## 2023-09-05 NOTE — Procedures (Signed)
 [facility] Neurology  [address] [place], [address]  [place], [place] [address] Tel: [phone] Fax: [phone] Test Date:  09/05/2023  Patient: Jared Hess DOB: Feb 08, 1951 Physician: Jacquelyne Balint, MD  Sex: Male Height: 6' 1.5" Ref Phys: Yetta Flock  ID#: [id number]   Technician:    History: This is a 73 year old male with pain and weakness in bilateral arms.  NCV & EMG Findings: Extensive electrodiagnostic evaluation of bilateral upper limbs shows: Bilateral median sensory responses are absent. Bilateral ulnar, radial, medial antebrachial cutaneous, and lateral antebrachial cutaneous sensory responses are within normal limits. Bilateral median (APB) motor responses are absent. Bilateral ulnar (ADM) motor responses are within normal limits. There is no evidence of active or chronic motor axon loss changes affecting any of the tested muscles on needle examination. Motor unit configuration and recruitment pattern is within normal limits.  Impression: This is an abnormal study. The findings are most consistent with the following: Evidence of bilateral median mononeuropathy at or distal to the wrist, consistent with carpal tunnel syndrome, very severe in degree electrically. No electrodiagnostic evidence of a right or left cervical (C5-C8) motor radiculopathy. No electrodiagnostic evidence of a right or left brachial plexopathy. Screening studies for right or left ulnar or radial mononeuropathies are normal.    ___________________________ Jacquelyne Balint, MD    Nerve Conduction Studies Motor Nerve Results    Latency Amplitude F-Lat Segment Distance CV Comment  Site (ms) Norm (mV) Norm (ms)  (cm) (m/s) Norm   Left Median (APB) Motor  Wrist *NR  < 4.0 *NR  > 5.0        Elbow *NR - *NR -  Elbow-Wrist - *NR  > 50   Right Median (APB) Motor  Wrist *NR  < 4.0 *NR  > 5.0        Elbow *NR - *NR -  Elbow-Wrist - *NR  > 50   Left Ulnar (ADM) Motor  Wrist 2.3  < 3.1 8.1  >  7.0        Bel elbow 6.6 - 7.8 -  Bel elbow-Wrist 24 56  > 50   Ab elbow 8.6 - 7.3 -  Ab elbow-Bel elbow 10 50 -   Right Ulnar (ADM) Motor  Wrist 2.5  < 3.1 10.6  > 7.0        Bel elbow 6.8 - 10.3 -  Bel elbow-Wrist 24.5 57  > 50   Ab elbow 8.7 - 10.1 -  Ab elbow-Bel elbow 10 53 -    Sensory Sites    Neg Peak Lat Amplitude (O-P) Segment Distance Velocity Comment  Site (ms) Norm (V) Norm  (cm) (ms)   Left Lateral Antebrachial Cutaneous Sensory  Lat biceps-Lat forearm 2.5  < 2.8 12  > 10 Lat biceps-Lat forearm 12    Right Lateral Antebrachial Cutaneous Sensory  Lat biceps-Lat forearm 2.6  < 2.8 18  > 10 Lat biceps-Lat forearm 12    Left Medial Antebrachial Cutaneous Sensory  Elbow-Med forearm 2.7  < 3.2 9  > 5 Elbow-Med forearm 12    Right Medial Antebrachial Cutaneous Sensory  Elbow-Med forearm 2.6  < 3.2 11  > 5 Elbow-Med forearm 12    Left Median Sensory  Wrist-Dig II *NR  < 3.8 *NR  > 10 Wrist-Dig II 13    Right Median Sensory  Wrist-Dig II *NR  < 3.8 *NR  > 10 Wrist-Dig II 13    Left Radial Sensory  Forearm-Wrist 2.3  <  2.8 12  > 10 Forearm-Wrist 10    Right Radial Sensory  Forearm-Wrist 2.6  < 2.8 12  > 10 Forearm-Wrist 10    Left Ulnar Sensory  Wrist-Dig V 3.2  < 3.2 7  > 5 Wrist-Dig V 11    Right Ulnar Sensory  Wrist-Dig V 2.9  < 3.2 12  > 5 Wrist-Dig V 11     Electromyography   Side Muscle Ins.Act Fibs Fasc Recrt Amp Dur Poly Activation Comment  Right FDI Nml Nml Nml Nml Nml Nml Nml Nml N/A  Left FDI Nml Nml Nml Nml Nml Nml Nml Nml N/A  Left FPL Nml Nml Nml Nml Nml Nml Nml Nml N/A  Left EIP Nml Nml Nml Nml Nml Nml Nml Nml N/A  Left Pronator teres Nml Nml Nml Nml Nml Nml Nml Nml N/A  Left Biceps Nml Nml Nml Nml Nml Nml Nml Nml N/A  Left Triceps Nml Nml Nml Nml Nml Nml Nml Nml N/A  Left Deltoid Nml Nml Nml Nml Nml Nml Nml Nml N/A  Right EIP Nml Nml Nml Nml Nml Nml Nml Nml N/A  Right Pronator teres Nml Nml Nml Nml Nml Nml Nml Nml N/A  Right Biceps Nml Nml Nml Nml  Nml Nml Nml Nml N/A  Right Triceps Nml Nml Nml Nml Nml Nml Nml Nml N/A  Right Deltoid Nml Nml Nml Nml Nml Nml Nml Nml N/A      Waveforms:  Motor           Sensory

## 2023-09-21 ENCOUNTER — Ambulatory Visit (INDEPENDENT_AMBULATORY_CARE_PROVIDER_SITE_OTHER): Payer: Medicare Other | Admitting: Neurosurgery

## 2023-09-21 ENCOUNTER — Encounter: Payer: Self-pay | Admitting: Neurosurgery

## 2023-09-21 VITALS — BP 136/88 | Ht 73.5 in | Wt 182.0 lb

## 2023-09-21 DIAGNOSIS — G5602 Carpal tunnel syndrome, left upper limb: Secondary | ICD-10-CM

## 2023-09-21 DIAGNOSIS — R29898 Other symptoms and signs involving the musculoskeletal system: Secondary | ICD-10-CM

## 2023-09-21 DIAGNOSIS — Z09 Encounter for follow-up examination after completed treatment for conditions other than malignant neoplasm: Secondary | ICD-10-CM

## 2023-09-21 NOTE — Progress Notes (Unsigned)
 REFERRING PHYSICIAN:  Lauro Regulus, Md [address] [place],  [place] [address]  DOS:   07/12/23, Right carpal tunnel release  08/09/23, left carpal tunnel release  HISTORY OF PRESENT ILLNESS: Jared [place] is  status post bilateral carpal tunnel release with ultrasound guidance.  Dates are above.  He initially had a very good outcome on the right side and a good outcome for approximately 2 weeks after the second surgery.  Unfortunately after that time he had a severe exacerbation of pain.  When I first met him in January he has had 8 months of rapidly progressive severe bilateral carpal tunnel syndrome.  He presented with severe weakness in his hands so much think that he had to use his pinky or ring finger to open things he also had a loss of sensation in his lateral 4 digits.  He tried sleeping with braces which did not help, he had an EMG which demonstrated severe carpal tunnel syndrome bilaterally.  When we saw him last we made an urgent referral to neurology as he had acute wrist drop bilaterally, and complete loss of ulnar and median function.  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  PHYSICAL EXAMINATION:  NEUROLOGICAL:  General: In no acute distress.   Awake, alert, oriented to person, place, and time.  Pupils equal round and reactive to light.    When we saw him last he had bilateral wrist drops and complete loss of ulnar and median function.  He has since recovered radial function and his exam is comparable to his preoperative exam back in January with 1-2 out of 5 function in his thenar musculature, and 3-4 function in his lumbricals.  He does state that he continues to have sensation changes in the radial distribution on the hand however this was not seen on his nerve conduction  testing.  Incision c/d/I  [id number] [facility]  [address] [place], [address]  [place], [place] [address] Tel: [phone] Fax: [phone] Test Date:  09/05/2023   Patient: Jared Hess DOB: [birth year] Physician: Jared Balint, MD  Sex: Male Height: 6' 1.5" Ref Phys: [place]  ID#: [id number]     Technician:      History: This is a 73 year old male with pain and weakness in bilateral arms.   NCV & EMG Findings: Extensive electrodiagnostic evaluation of bilateral upper limbs shows: Bilateral median sensory responses are absent. Bilateral ulnar, radial, medial antebrachial cutaneous, and lateral antebrachial cutaneous sensory responses are within normal limits. Bilateral median (APB) motor responses are absent. Bilateral ulnar (ADM) motor responses are within normal limits. There is no evidence of active or chronic motor axon loss changes affecting any of the tested muscles on needle examination. Motor unit configuration and recruitment pattern is within normal limits.   Impression: This is an abnormal study. The findings are most consistent with the following: Evidence of bilateral median mononeuropathy at or distal to the wrist, consistent with carpal tunnel syndrome, very severe in degree electrically. No electrodiagnostic evidence of a right or left cervical (C5-C8) motor radiculopathy. No electrodiagnostic evidence of a right or left brachial plexopathy. Screening studies for right or left ulnar or radial mononeuropathies are normal.       ___________________________ Jared Balint, MD   Assessment / Plan: Jared [place]  is  status post bilateral carpal tunnel release.  He was initially doing very well on his right side and had approximately 2 weeks of improvement on his left side and then unfortunately had an acute exacerbation of worsening function.  When we first met him in January he had severe loss of sensation and weakness in his median nerve  distribution.  He states that he had got to the point where he would use his pinky to open things.  He had severe carpal tunnel syndrome with loss of nerve conduction at the wrist bilaterally.  On his last follow-up he had a severe exacerbation of pain numbness tingling and weakness in the bilateral upper extremities.  Since these were outside of the median nerve distribution we had him evaluated by neurology with the EMG nerve conduction study.  That nerve conduction study came back stable with severe carpal tunnel syndrome bilaterally.  No evidence of any other radiculopathy brachial plexopathy or mononeuropathy.  Thankfully since his acute episode where he had lost all ulnar and radial function he has improved in his ulnar and radial function.  He continues to have weakness in his median nerve distribution distal to the wrist as he did prior to surgery.  He also continues to have loss of sensation as he did prior to surgery.  Given his level of severity of carpal tunnel syndrome, it is possible that he will not have a significant improvement in his function as he was so severe on his initial presentation.  He has been taking gabapentin however he is only been taking 300 at night, since he has been tolerating this well we recommended increasing his gabapentin to 2 times a day, if he tolerates this then we will have him go to 3 times a day for stable baseline dose.  Will continue to follow him.  Advised to contact the office if any questions or concerns arise.   Jared Kim, MD Dept of Neurosurgery

## 2023-10-19 ENCOUNTER — Encounter: Payer: Self-pay | Admitting: Neurosurgery

## 2023-10-19 ENCOUNTER — Ambulatory Visit: Admitting: Neurosurgery

## 2023-10-19 VITALS — BP 124/82 | Ht 73.5 in | Wt 182.0 lb

## 2023-10-19 DIAGNOSIS — Z09 Encounter for follow-up examination after completed treatment for conditions other than malignant neoplasm: Secondary | ICD-10-CM

## 2023-10-19 DIAGNOSIS — G5603 Carpal tunnel syndrome, bilateral upper limbs: Secondary | ICD-10-CM | POA: Insufficient documentation

## 2023-10-19 NOTE — Progress Notes (Signed)
   REFERRING PHYSICIAN:  Jimmy Moulding, Md [address] [facility] [place],  [place] [address]  DOS:   07/12/23, Right carpal tunnel release  08/09/23, left carpal tunnel release  HISTORY OF PRESENT ILLNESS: [place] is  status post bilateral carpal tunnel release with ultrasound guidance.  Dates are above.  He initially had a very good outcome on the right side and a good outcome for approximately 2 weeks after the second surgery.  Unfortunately after that time he had a severe exacerbation of pain.  Thankfully his pain exacerbation has went away and he is no longer having any pain and not waking up at night with any pain in his hands.  He has had a improvement in his motor function.  Unfortunately continues to have complete loss of sensation in the median distribution    PHYSICAL EXAMINATION:  NEUROLOGICAL:  General: In no acute distress.   Awake, alert, oriented to person, place, and time.  Pupils equal round and reactive to light.    When we saw him last he had bilateral wrist drops and complete loss of ulnar and median function.  He has since recovered radial function and his exam is comparable to his preoperative exam back in January with 1-2 out of 5 function in his thenar musculature, and 3-4 function in his lumbricals..  At this appointment on 10/19/2023 he has improved quality of his hand movements.  Approximately 2-3 out of 5 in his thenar musculature, 3-4 continue in his lumbricals, limited by some slight flexion contractures at the interphalangeal joints.  Incision c/d/I   Assessment / Plan: [place]  is  status post bilateral carpal tunnel release for severe grade 4 carpal tunnel.  He had a delayed onset of pain numbness tingling and weakness approximately 2 weeks after his second surgery.  This was bilateral.  Thankfully his pain has improved considerably.  He is now left with improving strength however continues to have severe numbness in his  hands limiting his functional outcome.  Notably he was grade 4 without any measurable sensory conduction since his initial workup with neurology.  We did discuss that sensation function may never come back after carpal tunnel surgery even with successful decompression of the median nerve.  We do feel that he would benefit from occupational therapy, he is very motivated and could benefit from adaptive therapies and exercise regimens for his hands.  Advised to contact the office if any questions or concerns arise.   Carroll Clamp, MD Dept of Neurosurgery

## 2023-11-01 ENCOUNTER — Ambulatory Visit: Attending: Physician Assistant | Admitting: Occupational Therapy

## 2023-11-01 DIAGNOSIS — M6281 Muscle weakness (generalized): Secondary | ICD-10-CM | POA: Diagnosis present

## 2023-11-01 DIAGNOSIS — R29898 Other symptoms and signs involving the musculoskeletal system: Secondary | ICD-10-CM | POA: Diagnosis not present

## 2023-11-01 DIAGNOSIS — R278 Other lack of coordination: Secondary | ICD-10-CM | POA: Insufficient documentation

## 2023-11-01 NOTE — Therapy (Signed)
 OUTPATIENT OCCUPATIONAL THERAPY NEURO EVALUATION  Patient Name: [place] [facility] MRN: [id number] DOB:1950/11/29, 73 y.o., male Today's Date: 11/02/2023  PCP: Overton Blotter, MD REFERRING PROVIDER: Ludwig Safer, PA-C  END OF SESSION:  OT End of Session - 11/02/23 2952     Visit Number 1    Number of Visits 12    Date for OT Re-Evaluation 01/25/24    OT Start Time 1615    OT Stop Time 1720    OT Time Calculation (min) 65 min    Activity Tolerance Patient tolerated treatment well    Behavior During Therapy WFL for tasks assessed/performed             Past Medical History:  Diagnosis Date   Bilateral carpal tunnel syndrome    Chronic bilateral low back pain without sciatica    CKD (chronic kidney disease) stage 3, GFR 30-59 ml/min (HCC)    Dementia (HCC)    DM (diabetes mellitus), type 1 (HCC)    ED (erectile dysfunction)    Hypothyroidism    Neuropathy    Prostate cancer (HCC)    Thyroid disease    Past Surgical History:  Procedure Laterality Date   BICEPT TENODESIS Right 07/14/2021   Procedure: BICEPS TENODESIS;  Surgeon: Rande Bushy, MD;  Location: [facility] ORS;  Service: Orthopedics;  Laterality: Right;   CARPAL TUNNEL RELEASE Right 07/12/2023   Procedure: RIGHT CARPAL TUNNEL RELEASE WITH ULTRASOUND GUIDANCE;  Surgeon: Carroll Clamp, MD;  Location: [facility] ORS;  Service: Neurosurgery;  Laterality: Right;   CARPAL TUNNEL RELEASE Left 08/09/2023   Procedure: LEFT CARPAL TUNNEL RELEASE WITH ULTRASOUND GUIDANCE;  Surgeon: Carroll Clamp, MD;  Location: [facility] ORS;  Service: Neurosurgery;  Laterality: Left;   FOOT SURGERY Left    LUMBAR FUSION     PROSTATE CRYOABLATION  2010   SHOULDER ARTHROSCOPY WITH OPEN ROTATOR CUFF REPAIR AND DISTAL CLAVICLE ACROMINECTOMY Right 07/14/2021   Procedure: SHOULDER ARTHROSCOPY WITH OPEN ROTATOR CUFF REPAIR AND DISTAL CLAVICLE ACROMINECTOMY;  Surgeon: Rande Bushy, MD;  Location: [facility] ORS;  Service: Orthopedics;   Laterality: Right;   shoulder blade surgery  2002   Patient Active Problem List   Diagnosis Date Noted   Bilateral carpal tunnel syndrome 10/19/2023   Left carpal tunnel syndrome 08/09/2023   Right carpal tunnel syndrome 06/27/2023   Abnormal cortisol level 09/14/2021   Neuropathy 05/11/2021   Disorder of thyroid gland 05/11/2021   Hyperglycemia due to diabetes mellitus (HCC) 01/14/2021   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  Pt accompanied by: self  PERTINENT HISTORY: Pt. Is a 73 y.o. male who was diagnosed with severe bilateral Carpal Tunnel Syndrome. Pt. had Right CTS on 07/12/23, and left CTS on 08/09/23. Following surgery, Pt. had a sudden onset of pain, and weakness in the ulnar, radial, and median distributions with wrist drop. Pt. Presents with significant sensory impairments in the bilateral hands.    PRECAUTIONS: None  WEIGHT BEARING RESTRICTIONS: No  PAIN:  Are you having  pain? 2/10 at rest   FALLS: Has patient fallen in last 6 months? No  LIVING ENVIRONMENT: Lives with: lives with their spouse Lives in: House/apartment Stairs:  4 steps Has following equipment at home: None  PLOF: Independent  PATIENT GOALS:   OBJECTIVE:  Note: Objective measures were completed at Evaluation unless otherwise noted.  HAND DOMINANCE: Right  ADLs:  Transfers/ambulation related to ADLs: Eating: Independent, drops items frequently, unable to  use chopsticks Grooming: Independent UB Dressing: Independent, Assist with buttoning LB Dressing: Independent, difficulty tying shoes, zipper on pants Toileting: Independent Bathing: independent Tub Shower transfers: Independent   IADLs: Shopping: Independent Light housekeeping: Independent-loses grip on breakables Meal Prep: Independent Community mobility: Driving Medication management: Independent, drops  pills Financial management: No changes Handwriting: 75% legible  MOBILITY STATUS: Independent  ACTIVITY TOLERANCE: Activity tolerance: WFL  FUNCTIONAL OUTCOME MEASURES:  MAM-20 Sum score: 55, MAM Measure score: 53.9  UPPER EXTREMITY ROM:    Active ROM Right Eval WFL Left Eval WFL  Shoulder flexion    Shoulder abduction    Shoulder adduction    Shoulder extension    Shoulder internal rotation    Shoulder external rotation    Elbow flexion    Elbow extension    Wrist flexion    Wrist extension    Wrist ulnar deviation    Wrist radial deviation    Wrist pronation    Wrist supination    (Blank rows = not tested)  UPPER EXTREMITY MMT:     MMT Right Eval 5/5 Left Eval 5/5  Shoulder flexion    Shoulder abduction    Shoulder adduction    Shoulder extension    Shoulder internal rotation    Shoulder external rotation    Middle trapezius    Lower trapezius    Elbow flexion    Elbow extension    Wrist flexion    Wrist extension    Wrist ulnar deviation    Wrist radial deviation    Wrist pronation    Wrist supination    (Blank rows = not tested)  HAND FUNCTION: Grip strength: Right: 29 lbs; Left: 31 lbs, Lateral pinch: Right: 6 lbs, Left: 7 lbs, and 3 point pinch: Right: 8 lbs, Left: 5 lbs  COORDINATION: 9 Hole Peg test: Right: 39 sec; Left: 35 sec  SENSATION:  Severe sensory impairments in the bilateral hands.  EDEMA: WFL  MUSCLE TONE: Intact  COGNITION: Overall cognitive status: Within functional limits for tasks  assessed  VISION: Subjective report: WFL, No change from baseline  PERCEPTION: WFL  PRAXIS: WFL                                                                                                                            TREATMENT DATE: 11/02/23   OT initial evaluation was completed, and Pt. education was provided as indicated below in the Pt. education section  Self-care:  Pt. education was provided about a buttonhook to assist with  buttoning efficiently. Pt. Was able to demonstrate proper technique.  There. Ex.:  -Bilateral hand strengthening using green theraputty for gross grip strengthening, lateral, and 3pt. Pinch strengthening.  -Pt. education about a HEP for hand strengthening     PATIENT EDUCATION: Education details: OT services, POC, goals, HEP, buttonhook Person educated: Patient Education method: Explanation, Demonstration, and Tactile cues Education comprehension: verbalized understanding, returned demonstration, verbal cues required, and tactile cues required  HOME EXERCISE PROGRAM: Hand strengthening with green Theraputty   GOALS: Goals reviewed with patient? Yes  SHORT TERM GOALS: Target date: 12/14/2023    Pt. Will be independent with HEPs for the BUEs. Baseline: Eval: Cue and assist for HEP for grip, and pinch strength. Goal status: INITIAL   LONG TERM GOALS: Target date: 01/25/2024    Pt. Will increase bilateral grip strength by 5# to be able to open jars, and containers. Baseline: Eval: R: 29#, L: 31#  Goal status: INITIAL  2.  Pt. Will increase bilateral lateral pinch strength by 3# to be able to assist with clipping nails. Baseline: Eval: R: 6#, L: 7# Goal status: INITIAL  3.  Pt. Will improve bilateral 3pt. Pinch strength to be able to hold and zip clothing. Baseline: Eval: R: 8# , L: 5# Goal status: INITIAL  4.  Pt. Will improve bilateral hand [facility] skills  by 3 sec. of speed in order to be able to efficiently manipulate small objects and  buttons. Baseline: Eval: R: 39 sec., L: 35 sec.  Goal status: INITIAL  5.  Pt. Will improve MAM-Measure score by 5 points to reflect improved bilateral hand function. Baseline: Eval: Sum score: 55, MAM-Measure Score: 53.9 Goal status: INITIAL   ASSESSMENT:  CLINICAL IMPRESSION:  Patient is a 73 y.o. male who was seen today for occupational therapy evaluation for Severe Bilateral Carpal Tunnel Syndrome. Pt. presents with severe sensory impairments in the bilateral hands, impaired bilateral grip strength, pinch strength, and impaired bilateral [facility] skills which limit his ability to efficiently manipulate objects without dropping them, securely hold items, cut food, tie shoes, cut nails, and perform buttoning efficiently. MAM Measure score: 53.9; Sum Score: 55. Pt. Will benefit from OT services to provide education about compensatory strategies for bilateral sensory impairments. Pt. Will also benefit from working on improving bilateral hand function skills, and maximizing overall independence with ADLs, and IADL.   PERFORMANCE DEFICITS: in functional skills including ADLs, IADLs, coordination, dexterity, sensation, strength, and Fine motor control, cognitive skills including , and psychosocial skills including environmental adaptation and routines and behaviors.   IMPAIRMENTS: are limiting patient from ADLs, IADLs, work, and leisure.   CO-MORBIDITIES: may have co-morbidities  that affects occupational performance. Patient will benefit from skilled OT to address above impairments and improve overall function.  MODIFICATION OR ASSISTANCE TO COMPLETE EVALUATION: Min-Moderate modification of tasks or assist with assess necessary to complete an evaluation.  OT OCCUPATIONAL PROFILE AND HISTORY: Detailed assessment: Review of records and additional review of physical, cognitive, psychosocial history related to current functional performance.  CLINICAL DECISION MAKING: Moderate - several treatment  options, min-mod task modification necessary  REHAB POTENTIAL: Good  EVALUATION COMPLEXITY: Moderate    PLAN:  OT FREQUENCY: 1x/week  OT DURATION: 12 weeks  PLANNED INTERVENTIONS: 97535 self care/ADL training, 19147 therapeutic exercise, 97530 therapeutic activity, 97112 neuromuscular re-education, 97140 manual therapy, 97018 paraffin, 82956 moist heat, 97034 contrast bath, 97750 Physical Performance Testing, 21308 Orthotic Initial, [id number] Orthotic/Prosthetic subsequent, patient/family education, and DME and/or AE instructions  RECOMMENDED OTHER SERVICES: None  CONSULTED AND AGREED WITH PLAN OF CARE: Patient  PLAN FOR NEXT SESSION:  Treatment  Duey Ghent, MS, OTR/L   11/02/2023, 8:25 AM

## 2023-11-08 ENCOUNTER — Ambulatory Visit: Admitting: Occupational Therapy

## 2023-11-08 DIAGNOSIS — M6281 Muscle weakness (generalized): Secondary | ICD-10-CM

## 2023-11-08 DIAGNOSIS — R278 Other lack of coordination: Secondary | ICD-10-CM

## 2023-11-09 NOTE — Therapy (Signed)
 OUTPATIENT OCCUPATIONAL THERAPY NEURO TREATMENT  Patient Name: Virginio Isidore [facility].) MRN: [id number] DOB:Sep 14, 1950, 73 y.o., male Today's Date: 11/09/2023  PCP: Overton Blotter, MD REFERRING PROVIDER: Ludwig Safer, PA-C  END OF SESSION:  OT End of Session - 11/09/23 0811     Visit Number 2    Number of Visits 12    Date for OT Re-Evaluation 01/25/24    OT Start Time 1615    OT Stop Time 1700    OT Time Calculation (min) 45 min    Activity Tolerance Patient tolerated treatment well    Behavior During Therapy WFL for tasks assessed/performed              Past Medical History:  Diagnosis Date   Bilateral carpal tunnel syndrome    Chronic bilateral low back pain without sciatica    CKD (chronic kidney disease) stage 3, GFR 30-59 ml/min (HCC)    Dementia (HCC)    DM (diabetes mellitus), type 1 (HCC)    ED (erectile dysfunction)    Hypothyroidism    Neuropathy    Prostate cancer (HCC)    Thyroid disease    Past Surgical History:  Procedure Laterality Date   BICEPT TENODESIS Right 07/14/2021   Procedure: BICEPS TENODESIS;  Surgeon: Rande Bushy, MD;  Location: [facility];  Service: Orthopedics;  Laterality: Right;   CARPAL TUNNEL RELEASE Right 07/12/2023   Procedure: RIGHT CARPAL TUNNEL RELEASE WITH ULTRASOUND GUIDANCE;  Surgeon: Carroll Clamp, MD;  Location: [facility];  Service: Neurosurgery;  Laterality: Right;   CARPAL TUNNEL RELEASE Left 08/09/2023   Procedure: LEFT CARPAL TUNNEL RELEASE WITH ULTRASOUND GUIDANCE;  Surgeon: Carroll Clamp, MD;  Location: [facility];  Service: Neurosurgery;  Laterality: Left;   FOOT SURGERY Left    LUMBAR FUSION     PROSTATE CRYOABLATION  2010   SHOULDER ARTHROSCOPY WITH OPEN ROTATOR CUFF REPAIR AND DISTAL CLAVICLE ACROMINECTOMY Right 07/14/2021   Procedure: SHOULDER ARTHROSCOPY WITH OPEN ROTATOR CUFF REPAIR AND DISTAL CLAVICLE ACROMINECTOMY;  Surgeon: Rande Bushy, MD;  Location: [facility];  Service: Orthopedics;   Laterality: Right;   shoulder blade surgery  2002   Patient Active Problem List   Diagnosis Date Noted   Bilateral carpal tunnel syndrome 10/19/2023   Left carpal tunnel syndrome 08/09/2023   Right carpal tunnel syndrome 06/27/2023   Abnormal cortisol level 09/14/2021   Neuropathy 05/11/2021   Disorder of thyroid gland 05/11/2021   Hyperglycemia due to diabetes mellitus (HCC) 01/14/2021   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 reports increasing sensation within bilateral 5th digits into the hypothenar eminence.  Pt accompanied by: self  PERTINENT HISTORY: Pt. Is a 73 y.o. male who was diagnosed with severe bilateral Carpal Tunnel Syndrome. Pt. had Right CTS on 07/12/23, and left CTS on 08/09/23. Following surgery, Pt. had a sudden onset of pain, and weakness in the ulnar, radial, and median distributions with wrist drop. Pt. Presents with significant sensory impairments in the bilateral hands.    PRECAUTIONS: None  WEIGHT BEARING RESTRICTIONS: No  PAIN:  Are you having pain?  0/10 pain  FALLS: Has patient fallen in last 6 months? No  LIVING ENVIRONMENT: Lives with: lives with their spouse Lives in: House/apartment Stairs:  4 steps Has following equipment at home: None  PLOF: Independent  PATIENT GOALS:   OBJECTIVE:  Note: Objective measures were completed at Evaluation unless otherwise noted.  HAND DOMINANCE: Right  ADLs:  Transfers/ambulation related to ADLs: Eating: Independent, drops items frequently,  unable to use chopsticks Grooming: Independent UB Dressing: Independent, Assist with buttoning LB Dressing: Independent, difficulty tying shoes, zipper on pants Toileting: Independent Bathing: independent Tub Shower transfers: Independent   IADLs: Shopping: Independent Light housekeeping: Independent-loses grip on breakables Meal Prep: Independent Community mobility: Driving Medication management: Independent, drops  pills Financial management: No changes Handwriting: 75% legible  MOBILITY STATUS: Independent  ACTIVITY TOLERANCE: Activity tolerance: WFL  FUNCTIONAL OUTCOME MEASURES:  MAM-20 Sum score: 55, MAM Measure score: 53.9  UPPER EXTREMITY ROM:    Active ROM Right Eval WFL Left Eval WFL  Shoulder flexion    Shoulder abduction    Shoulder adduction    Shoulder extension    Shoulder internal rotation    Shoulder external rotation    Elbow flexion    Elbow extension    Wrist flexion    Wrist extension    Wrist ulnar deviation    Wrist radial deviation    Wrist pronation    Wrist supination    (Blank rows = not tested)  UPPER EXTREMITY MMT:     MMT Right Eval 5/5 Left Eval 5/5  Shoulder flexion    Shoulder abduction    Shoulder adduction    Shoulder extension    Shoulder internal rotation    Shoulder external rotation    Middle trapezius    Lower trapezius    Elbow flexion    Elbow extension    Wrist flexion    Wrist extension    Wrist ulnar deviation    Wrist radial deviation    Wrist pronation    Wrist supination    (Blank rows = not tested)  HAND FUNCTION: Grip strength: Right: 29 lbs; Left: 31 lbs, Lateral pinch: Right: 6 lbs, Left: 7 lbs, and 3 point pinch: Right: 8 lbs, Left: 5 lbs  COORDINATION: 9 Hole Peg test: Right: 39 sec; Left: 35 sec  SENSATION:  Severe sensory impairments in the bilateral hands.  EDEMA: WFL  MUSCLE TONE: Intact  COGNITION: Overall cognitive status: Within functional limits for tasks  assessed  VISION: Subjective report: WFL, No change from baseline  PERCEPTION: WFL  PRAXIS: WFL                                                                                                                            TREATMENT DATE: 11/08/2023   Therapeutic Ex:  -Bilateral progressive gross grip strengthening with  # of grip strength resistive force.   Therapeutic Activities: -Theraputty hand strengthening exercises including: gross gripping, gross digit extension, lateral, and 3pt. pinch  strengthening, thumb opposition, bilateral alternating twisting motion in opposite directions with supination/pronation, rolling circular spheres within the tips of the fingers, and manipulating putty within the tips of fingers to remove small objects. Pt. Was provided with a visual handout HEP through [employer] with a video access code.  -Bilateral lateral and 3pt. Pinch strengthening using yellow, red, green, and blue, and black level resistive clips  -Facilitated translatory movements moving clips from the lateral pinch position to the 3pt. Pinch position in preparation for securely placing them on the dowel.  -Facilitated bilateral fine motor coordination skills grasping and manipulating nuts and bolts on a bolt board with vision occluded.  -Facilitated fine motor coordination skills with combined supination using the small attachments the EZ board.  -Bilateral Median nerve glides  Manual Therapy:  -Bilateral carpal spread stretches -soft tissue massage to the bilateral volar carpal surface and the thenar eminence.   PATIENT EDUCATION: Education details: Theraputty hand strengthening exercises, median nerve glides, educated on sensory changes Person educated: Patient Education method: Explanation, Demonstration, and Tactile cues Education comprehension: verbalized understanding, returned demonstration, verbal cues required, and tactile cues required  HOME EXERCISE PROGRAM: Hand strengthening  with blue Theraputty, median nerve glides   GOALS: Goals reviewed with patient? Yes  SHORT TERM GOALS: Target date: 12/14/2023    Pt. Will be independent with HEPs for the BUEs. Baseline: Eval: Cue and assist for HEP for grip, and pinch strength. Goal status: INITIAL   LONG TERM GOALS: Target date: 01/25/2024    Pt. Will increase bilateral grip strength by 5# to be able to open jars, and containers. Baseline: Eval: R: 29#, L: 31#  Goal status: INITIAL  2.  Pt. Will increase bilateral lateral pinch strength by 3# to be able to assist with clipping nails. Baseline: Eval: R: 6#, L: 7# Goal status: INITIAL  3.  Pt. Will improve bilateral 3pt. Pinch strength to be able to hold and zip clothing. Baseline: Eval: R: 8# , L: 5# Goal status: INITIAL  4.  Pt. Will improve bilateral hand [facility] skills  by 3 sec. of speed in order to be able to efficiently manipulate small objects and buttons. Baseline: Eval: R: 39 sec., L: 35 sec.  Goal status: INITIAL  5.  Pt. Will improve MAM-Measure score by 5 points to reflect improved bilateral hand function. Baseline: Eval: Sum score: 55, MAM-Measure Score: 53.9 Goal status: INITIAL   ASSESSMENT:  CLINICAL IMPRESSION:  Pt. Reports sensory changes in bilateral 5th digit and hypothenar eminence. Pt. Reports no pain. Pt tolerated manual therapy and therapeutic activities well. Pt. Was upgraded to blue theraputty resistance level. Pt. Requires cues for form technique and pace with each exercise.  Pt. Will benefit from OT services to provide education about compensatory strategies for bilateral sensory impairments. Pt. Will also benefit from working on improving bilateral hand function skills, and maximizing overall independence with ADLs, and IADL.   PERFORMANCE DEFICITS: in functional skills including ADLs, IADLs, coordination, dexterity, sensation, strength, and Fine motor control, cognitive skills including , and psychosocial skills including  environmental adaptation and routines and behaviors.   IMPAIRMENTS: are limiting patient from ADLs, IADLs, work, and leisure.   CO-MORBIDITIES: may have co-morbidities  that affects occupational performance. Patient will benefit from skilled OT to address above impairments and improve overall function.  MODIFICATION OR ASSISTANCE TO COMPLETE EVALUATION: Min-Moderate modification of tasks or assist with assess necessary to complete an evaluation.  OT OCCUPATIONAL PROFILE AND HISTORY: Detailed assessment: Review of records and additional review  of physical, cognitive, psychosocial history related to current functional performance.  CLINICAL DECISION MAKING: Moderate - several treatment options, min-mod task modification necessary  REHAB POTENTIAL: Good  EVALUATION COMPLEXITY: Moderate    PLAN:  OT FREQUENCY: 1x/week  OT DURATION: 12 weeks  PLANNED INTERVENTIONS: 97535 self care/ADL training, 66440 therapeutic exercise, 97530 therapeutic activity, 97112 neuromuscular re-education, 97140 manual therapy, 97018 paraffin, 34742 moist heat, 97034 contrast bath, 97750 Physical Performance Testing, 59563 Orthotic Initial, [id number] Orthotic/Prosthetic subsequent, patient/family education, and DME and/or AE instructions  RECOMMENDED OTHER SERVICES: None  CONSULTED AND AGREED WITH PLAN OF CARE: Patient  PLAN FOR NEXT SESSION:  Treatment  Duey Ghent, MS, OTR/L   11/09/2023, 8:15 AM

## 2023-11-17 ENCOUNTER — Ambulatory Visit: Attending: Physician Assistant | Admitting: Occupational Therapy

## 2023-11-17 DIAGNOSIS — M6281 Muscle weakness (generalized): Secondary | ICD-10-CM | POA: Diagnosis present

## 2023-11-17 DIAGNOSIS — R278 Other lack of coordination: Secondary | ICD-10-CM | POA: Insufficient documentation

## 2023-11-17 NOTE — Therapy (Cosign Needed)
 OUTPATIENT OCCUPATIONAL THERAPY NEURO TREATMENT/DISCHARGE NOTE  Patient Name: Jared Hess [facility].) MRN: [id number] DOB:16-Jan-1951, 73 y.o., male Today's Date: 11/18/2023  PCP: Overton Blotter, MD REFERRING PROVIDER: Ludwig Safer, PA-C  END OF SESSION:  OT End of Session - 11/18/23 0038     Visit Number 3    Number of Visits 12    Date for OT Re-Evaluation 01/25/24    OT Start Time 1615    OT Stop Time 1720    OT Time Calculation (min) 65 min    Activity Tolerance Patient tolerated treatment well    Behavior During Therapy WFL for tasks assessed/performed               Past Medical History:  Diagnosis Date   Bilateral carpal tunnel syndrome    Chronic bilateral low back pain without sciatica    CKD (chronic kidney disease) stage 3, GFR 30-59 ml/min (HCC)    Dementia (HCC)    DM (diabetes mellitus), type 1 (HCC)    ED (erectile dysfunction)    Hypothyroidism    Neuropathy    Prostate cancer (HCC)    Thyroid disease    Past Surgical History:  Procedure Laterality Date   BICEPT TENODESIS Right 07/14/2021   Procedure: BICEPS TENODESIS;  Surgeon: Rande Bushy, MD;  Location: [facility];  Service: Orthopedics;  Laterality: Right;   CARPAL TUNNEL RELEASE Right 07/12/2023   Procedure: RIGHT CARPAL TUNNEL RELEASE WITH ULTRASOUND GUIDANCE;  Surgeon: Carroll Clamp, MD;  Location: [facility];  Service: Neurosurgery;  Laterality: Right;   CARPAL TUNNEL RELEASE Left 08/09/2023   Procedure: LEFT CARPAL TUNNEL RELEASE WITH ULTRASOUND GUIDANCE;  Surgeon: Carroll Clamp, MD;  Location: [facility];  Service: Neurosurgery;  Laterality: Left;   FOOT SURGERY Left    LUMBAR FUSION     PROSTATE CRYOABLATION  2010   SHOULDER ARTHROSCOPY WITH OPEN ROTATOR CUFF REPAIR AND DISTAL CLAVICLE ACROMINECTOMY Right 07/14/2021   Procedure: SHOULDER ARTHROSCOPY WITH OPEN ROTATOR CUFF REPAIR AND DISTAL CLAVICLE ACROMINECTOMY;  Surgeon: Rande Bushy, MD;  Location: [facility];  Service:  Orthopedics;  Laterality: Right;   shoulder blade surgery  2002   Patient Active Problem List   Diagnosis Date Noted   Bilateral carpal tunnel syndrome 10/19/2023   Left carpal tunnel syndrome 08/09/2023   Right carpal tunnel syndrome 06/27/2023   Abnormal cortisol level 09/14/2021   Neuropathy 05/11/2021   Disorder of thyroid gland 05/11/2021   Hyperglycemia due to diabetes mellitus (HCC) 01/14/2021   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 reports increasing sensation within bilateral 5th digits into the hypothenar eminence. Pt. Reports that he purchased some stronger resistance (dark blue) theraputty online, and is waiting for it to arrive.   Pt accompanied by: self  PERTINENT HISTORY: Pt. Is a 73 y.o. male who was diagnosed with severe bilateral Carpal Tunnel Syndrome. Pt. had Right CTS on 07/12/23, and left CTS on 08/09/23. Following surgery, Pt. had a sudden onset of pain, and weakness in the ulnar, radial, and median distributions with wrist drop. Pt. Presents with significant sensory  impairments in the bilateral hands.    PRECAUTIONS: None  WEIGHT BEARING RESTRICTIONS: No  PAIN:  Are you having pain? 0/10 pain 11/17/2023- 3/10 achy pain to the tips of all digits.   FALLS: Has patient fallen in last 6 months? No  LIVING ENVIRONMENT: Lives with: lives with their spouse Lives in: House/apartment Stairs:  4 steps Has following equipment at home: None  PLOF: Independent  PATIENT GOALS:   OBJECTIVE:  Note:  Objective measures were completed at Evaluation unless otherwise noted.  HAND DOMINANCE: Right  ADLs:  Transfers/ambulation related to ADLs: Eating: Independent, drops items frequently, unable to use chopsticks Grooming: Independent UB Dressing: Independent, Assist with buttoning LB Dressing: Independent, difficulty tying shoes, zipper on pants Toileting: Independent Bathing: independent Tub Shower transfers: Independent   IADLs: Shopping: Independent Light housekeeping: Independent-loses grip on breakables Meal Prep: Independent Community mobility: Driving Medication management: Independent, drops  pills Financial management: No changes Handwriting: 75% legible  MOBILITY STATUS: Independent  ACTIVITY TOLERANCE: Activity tolerance: WFL  FUNCTIONAL OUTCOME MEASURES:  Eval: MAM-20 Sum score: 55, MAM Measure score: 53.9 11/17/23: MAM-20 sum score: 77/80, MAM-Measure score: 80.4  UPPER EXTREMITY ROM:    Active ROM Right Eval WFL Left Eval [facility]  Shoulder flexion    Shoulder abduction    Shoulder adduction    Shoulder extension    Shoulder internal rotation    Shoulder external rotation    Elbow flexion    Elbow extension    Wrist flexion    Wrist extension    Wrist ulnar deviation    Wrist radial deviation    Wrist pronation    Wrist supination    (Blank rows = not tested)  UPPER EXTREMITY MMT:     MMT Right Eval 5/5 Left Eval 5/5  Shoulder flexion    Shoulder abduction    Shoulder adduction    Shoulder extension    Shoulder internal rotation    Shoulder external rotation    Middle trapezius    Lower trapezius    Elbow flexion    Elbow extension    Wrist flexion    Wrist extension    Wrist ulnar deviation    Wrist radial deviation    Wrist pronation    Wrist supination    (Blank rows = not tested)  HAND FUNCTION: Eval: Grip strength: Right: 29 lbs; Left: 31 lbs, Lateral pinch: Right: 6 lbs, Left: 7 lbs, and 3 point pinch: Right: 8 lbs, Left:  5 lbs  11/17/23: Grip strength: Right: 91 lbs; Left: 95 lbs, Lateral pinch: Right: 21 lbs, Left: 21 lbs, and 3 point pinch: Right: 11 lbs, Left: 13 lbs   COORDINATION: 9 Hole Peg test: Right: 39 sec; Left: 35 sec 11/17/23: Right: 32 sec., Left: 46 sec.  SENSATION:  Severe sensory impairments in the bilateral hands.  EDEMA: WFL  MUSCLE TONE: Intact  COGNITION: Overall cognitive status: Within functional limits for tasks assessed  VISION: Subjective report: WFL, No change from baseline  PERCEPTION: WFL  PRAXIS: WFL  TREATMENT DATE: 11/17/2023   Therapeutic Activities:   -facilitated RUE proximal stabilization while simultaneously performing distal motor control holding, and using a marker to navigate through a circular maze on a vertical whiteboard. -facilitated left hand motor control while navigating  a [place] magnet through a complex maze with vision occluded.   Neuromuscular re-education:   -Pt. Manipulated 1/16" beads from a 1" container prepping the hand for translatory tasks.  -Pt. Performed translatory tasks using R hand to bring 1/16" beads from the palm of the hand to the tip of the thumb, 2nd and 3rd digit to release into an 1" container.  -Pt. Performed [facility] skills using 1" sticks, 1/4" collars from the Purdue Pegboard to work on translatory tasks bringing objects from the palm of the hand to the tips of the thumb, 2nd and 3rd digit to place in pegboard.   Therapeutic Ex.:  -Reviewed Bilateral Median nerve glides with cues required for pace, and form.   Manual Therapy:   -Bilateral carpal spread stretches -Soft tissue massage to the bilateral volar carpal surface, the thenar eminence, as well as the volar, and dorsal surface of the thumb.    PATIENT EDUCATION: Education details: Theraputty hand strengthening exercises, median nerve glides,  educated on sensory changes Person educated: Patient Education method: Explanation, Demonstration, and Tactile cues Education comprehension: verbalized understanding, returned demonstration, verbal cues required, and tactile cues required  HOME EXERCISE PROGRAM: Hand strengthening with blue Theraputty, median nerve glides   GOALS: Goals reviewed with patient? Yes  SHORT TERM GOALS: Target date: 12/14/2023    Pt. Will be independent with HEPs for the BUEs. Baseline: Discharge: Independent Eval: Cue and assist for HEP for grip, and pinch strength. Goal status: INITIAL   LONG TERM GOALS: Target date: 01/25/2024    Pt. Will increase bilateral grip strength by 5# to be able to open jars, and containers. Baseline: Discharge: R: 91#, L: 95#  pt. Is able to open jars, and containers. Eval: R: 29#, L: 31#  Goal status:  ACHIEVED  2.  Pt. Will increase bilateral lateral pinch strength by 3# to be able to assist with clipping nails. Baseline: Discharge: R: 21#, L: 21#  Pt. Is able to independently clip nails. Eval: R: 6#, L: 7#  Goal status:  ACHIEVED  3.  Pt. Will improve bilateral 3pt. Pinch strength to be able to hold and zip clothing. Baseline: Discharge: R: 11#, L: 13# Pt. Is now able to zip clothing. Eval: R: 8# , L: 5# Goal status:  ACHIEVED  4.  Pt. Will improve bilateral hand [facility] skills  by 3 sec. of speed in order to be able to efficiently manipulate small objects and buttons. Baseline: Discharge: R:32 sec., L: 46 sec. (lotion on left hand)  Buttons require increased time.   Eval: R: 39 sec., L: 35 sec.  Goal status: Partially Met  5.  Pt. Will improve MAM-Measure score by 5 points to reflect improved bilateral hand function. Baseline: Discharge: Sum score: 77/80, MAM-Measure Score: 80.4  Eval: Sum score: 55, MAM-Measure Score: 53.9 Goal status:ACHIEVED   ASSESSMENT:  CLINICAL IMPRESSION:  Pt. tolerated treatment well today. Pt. was able to pinch 1/16" beads from  1"containers to the middle of the IF as a compensatory strategy for pinching small items. Pt. was able to manipulate 1/4 " collars and 1" sticks to place into the Purdue Pegboard using his R hand. Pt. Reports improvement with sensation in bilateral 5th digits. Pt. Is able to independently perform thumb opposition to  the 5th digit bilaterally. Pt. Was able to demonstrate bilateral median nerve glides with cues for pace, and to avoid overstretching. Pt. was able to stabilize the shoulder proximally with good distal motor control holding, and using a marker while completing a task at the vertical whiteboard. Pt. was able to navigate through a complex maze using his L hand to control a [place] magnet with his vision occluded to encourage sensorimotor awareness. Pt. education was provided about work simplification strategies for work related tasks. Measurements were obtained, and goals were reviewed with the Pt. Pt. Has made excellent progress overall, and Is able to demonstrate independence with HEPs for hand strengthening with theraputty, and median nerve glides. Pt. Is independent with STM, and [facility] tasks. Most goals have been met. Further OT services are not warranted at this time.  PERFORMANCE DEFICITS: in functional skills including ADLs, IADLs, coordination, dexterity, sensation, strength, and Fine motor control, cognitive skills including , and psychosocial skills including environmental adaptation and routines and behaviors.   IMPAIRMENTS: are limiting patient from ADLs, IADLs, work, and leisure.   CO-MORBIDITIES: may have co-morbidities  that affects occupational performance. Patient will benefit from skilled OT to address above impairments and improve overall function.  MODIFICATION OR ASSISTANCE TO COMPLETE EVALUATION: Min-Moderate modification of tasks or assist with assess necessary to complete an evaluation.  OT OCCUPATIONAL PROFILE AND HISTORY: Detailed assessment: Review of records and additional  review of physical, cognitive, psychosocial history related to current functional performance.  CLINICAL DECISION MAKING: Moderate - several treatment options, min-mod task modification necessary  REHAB POTENTIAL: Good  EVALUATION COMPLEXITY: Moderate    PLAN:  OT FREQUENCY: 1x/week  OT DURATION: 12 weeks  PLANNED INTERVENTIONS: 97535 self care/ADL training, 16109 therapeutic exercise, 97530 therapeutic activity, 97112 neuromuscular re-education, 97140 manual therapy, 97018 paraffin, 60454 moist heat, 97034 contrast bath, 97750 Physical Performance Testing, 09811 Orthotic Initial, [id number] Orthotic/Prosthetic subsequent, patient/family education, and DME and/or AE instructions  RECOMMENDED OTHER SERVICES: None  CONSULTED AND AGREED WITH PLAN OF CARE: Patient  PLAN FOR NEXT SESSION:  Treatment  Debora Fallen, OTS   This entire session was performed under direct supervision and direction of a licensed therapist/therapist assistant . I have personally read, edited and approve of the note as written.   Duey Ghent, MS, OTR/L   11/18/2023, 12:42 AM

## 2023-12-30 ENCOUNTER — Other Ambulatory Visit: Payer: Self-pay | Admitting: Emergency Medicine

## 2023-12-30 DIAGNOSIS — M25572 Pain in left ankle and joints of left foot: Secondary | ICD-10-CM

## 2023-12-30 DIAGNOSIS — G8929 Other chronic pain: Secondary | ICD-10-CM

## 2024-01-01 ENCOUNTER — Other Ambulatory Visit: Payer: Self-pay

## 2024-01-01 ENCOUNTER — Encounter: Payer: Self-pay | Admitting: Intensive Care

## 2024-01-01 ENCOUNTER — Emergency Department

## 2024-01-01 ENCOUNTER — Emergency Department
Admission: EM | Admit: 2024-01-01 | Discharge: 2024-01-01 | Disposition: A | Discharge status: Home / Self Care | Attending: Emergency Medicine | Admitting: Emergency Medicine

## 2024-01-01 DIAGNOSIS — Z4800 Encounter for change or removal of nonsurgical wound dressing: Secondary | ICD-10-CM | POA: Insufficient documentation

## 2024-01-01 DIAGNOSIS — Z8546 Personal history of malignant neoplasm of prostate: Secondary | ICD-10-CM | POA: Diagnosis not present

## 2024-01-01 DIAGNOSIS — Z5189 Encounter for other specified aftercare: Secondary | ICD-10-CM

## 2024-01-01 DIAGNOSIS — E1165 Type 2 diabetes mellitus with hyperglycemia: Secondary | ICD-10-CM | POA: Diagnosis not present

## 2024-01-01 DIAGNOSIS — E1122 Type 2 diabetes mellitus with diabetic chronic kidney disease: Secondary | ICD-10-CM | POA: Insufficient documentation

## 2024-01-01 DIAGNOSIS — R7989 Other specified abnormal findings of blood chemistry: Secondary | ICD-10-CM | POA: Insufficient documentation

## 2024-01-01 DIAGNOSIS — N181 Chronic kidney disease, stage 1: Secondary | ICD-10-CM | POA: Diagnosis not present

## 2024-01-01 LAB — CBC WITH DIFFERENTIAL/PLATELET
Abs Immature Granulocytes: 0.04 K/uL (ref 0.00–0.07)
Basophils Absolute: 0.1 K/uL (ref 0.0–0.1)
Basophils Relative: 1 %
Eosinophils Absolute: 0.4 K/uL (ref 0.0–0.5)
Eosinophils Relative: 6 %
HCT: 42.2 % (ref 39.0–52.0)
Hemoglobin: 14.1 g/dL (ref 13.0–17.0)
Immature Granulocytes: 1 %
Lymphocytes Relative: 27 %
Lymphs Abs: 2 K/uL (ref 0.7–4.0)
MCH: 29.1 pg (ref 26.0–34.0)
MCHC: 33.4 g/dL (ref 30.0–36.0)
MCV: 87.2 fL (ref 80.0–100.0)
Monocytes Absolute: 0.5 K/uL (ref 0.1–1.0)
Monocytes Relative: 7 %
Neutro Abs: 4.2 K/uL (ref 1.7–7.7)
Neutrophils Relative %: 58 %
Platelets: 209 K/uL (ref 150–400)
RBC: 4.84 MIL/uL (ref 4.22–5.81)
RDW: 13.1 % (ref 11.5–15.5)
WBC: 7.2 K/uL (ref 4.0–10.5)
nRBC: 0 % (ref 0.0–0.2)

## 2024-01-01 LAB — URINALYSIS, ROUTINE W REFLEX MICROSCOPIC
Bilirubin Urine: NEGATIVE
Glucose, UA: NEGATIVE mg/dL
Hgb urine dipstick: NEGATIVE
Ketones, ur: NEGATIVE mg/dL
Leukocytes,Ua: NEGATIVE
Nitrite: NEGATIVE
Protein, ur: NEGATIVE mg/dL
Specific Gravity, Urine: 1.023 (ref 1.005–1.030)
pH: 6 (ref 5.0–8.0)

## 2024-01-01 LAB — COMPREHENSIVE METABOLIC PANEL WITH GFR
ALT: 21 U/L (ref 0–44)
AST: 28 U/L (ref 15–41)
Albumin: 3.9 g/dL (ref 3.5–5.0)
Alkaline Phosphatase: 82 U/L (ref 38–126)
Anion gap: 7 (ref 5–15)
BUN: 23 mg/dL (ref 8–23)
CO2: 25 mmol/L (ref 22–32)
Calcium: 9 mg/dL (ref 8.9–10.3)
Chloride: 104 mmol/L (ref 98–111)
Creatinine, Ser: 1.25 mg/dL — ABNORMAL HIGH (ref 0.61–1.24)
GFR, Estimated: >60 mL/min (ref >60)
Glucose, Bld: 146 mg/dL — ABNORMAL HIGH (ref 70–99)
Potassium: 4.5 mmol/L (ref 3.5–5.1)
Sodium: 136 mmol/L (ref 135–145)
Total Bilirubin: 1.1 mg/dL (ref 0.0–1.2)
Total Protein: 6.6 g/dL (ref 6.5–8.1)

## 2024-01-01 MED ORDER — CEPHALEXIN 500 MG PO CAPS: 500.0000 mg | ORAL_CAPSULE | Freq: Four times a day (QID) | ORAL | 0 refills | Status: DC

## 2024-01-01 MED ORDER — HYDROCODONE-ACETAMINOPHEN 5-325 MG PO TABS: 1.0000 | ORAL_TABLET | Freq: Four times a day (QID) | ORAL | 0 refills | Status: DC | PRN

## 2024-01-01 MED ORDER — SULFAMETHOXAZOLE-TRIMETHOPRIM 800-160 MG PO TABS: 1.0000 | ORAL_TABLET | Freq: Two times a day (BID) | ORAL | 0 refills | Status: DC

## 2024-01-01 NOTE — ED Provider Notes (Signed)
 [facility] Provider Note    Event Date/Time   First MD Initiated Contact with Patient 01/01/24 1421     (approximate)   History   Wound Check   HPI  Che Rachal is a 73 y.o. male with PMH of prostate cancer, CKD, type 1 diabetes, and neuropathy presents for evaluation of a chronic wound to the left medial malleolus.  Patient states that he has had the wound for 50 years.  He has been seen by orthopedics who recommended he soak it in Epsom salt and apply mupirocin  ointment.  Patient is concerned because he is having increased drainage and pain.  He also has some swelling on the lateral side of the ankle that is not usually there.  There is no wound on the lateral ankle, only the medial ankle.  He is going to be traveling out of town to visit family next week and wants to make sure everything is okay before he leaves.      Physical Exam   Triage Vital Signs: ED Triage Vitals  Encounter Vitals Group     BP 01/01/24 1339 (!) 150/86     Girls Systolic BP Percentile --      Girls Diastolic BP Percentile --      Boys Systolic BP Percentile --      Boys Diastolic BP Percentile --      Pulse Rate 01/01/24 1339 65     Resp 01/01/24 1339 16     Temp 01/01/24 1339 97.6 F (36.4 C)     Temp Source 01/01/24 1339 Oral     SpO2 01/01/24 1339 98 %     Weight 01/01/24 1340 182 lb (82.6 kg)     Height 01/01/24 1340 6' 1.5 (1.867 m)     Head Circumference --      Peak Flow --      Pain Score 01/01/24 1340 6     Pain Loc --      Pain Education --      Exclude from Growth Chart --     Most recent vital signs: Vitals:   01/01/24 1339  BP: (!) 150/86  Pulse: 65  Resp: 16  Temp: 97.6 F (36.4 C)  SpO2: 98%   General: Awake, no distress.  CV:  Good peripheral perfusion.  Resp:  Normal effort.  Abd:  No distention.  Other:  Wound is warm and tender to palpation, lateral ankle is swollen but without a wound.    ED Results / Procedures / Treatments    Labs (all labs ordered are listed, but only abnormal results are displayed) Labs Reviewed  COMPREHENSIVE METABOLIC PANEL WITH GFR - Abnormal; Notable for the following components:      Result Value   Glucose, Bld 146 (*)    Creatinine, Ser 1.25 (*)    All other components within normal limits  URINALYSIS, ROUTINE W REFLEX MICROSCOPIC - Abnormal; Notable for the following components:   Color, Urine YELLOW (*)    APPearance CLEAR (*)    All other components within normal limits  CBC WITH DIFFERENTIAL/PLATELET    RADIOLOGY  Left ankle x-ray obtained to evaluate for erosion of hardware and osteomyelitis.  I interpreted the images as well as reviewed the radiologist report.  Infection does not appear to have spread to the bone.  PROCEDURES:  Critical Care performed: No  Procedures   MEDICATIONS ORDERED IN ED: Medications - No data to display   IMPRESSION / MDM / ASSESSMENT  AND PLAN / ED COURSE  I reviewed the triage vital signs and the nursing notes.                             73 year old male presents for evaluation of a wound check to the left medial ankle.  Blood pressure is elevated otherwise vital signs are stable patient NAD on exam.  Differential diagnosis includes, but is not limited to, chronic wound, cellulitis, hardware erosion, osteomyelitis.  Patient's presentation is most consistent with acute complicated illness / injury requiring diagnostic workup.  I reviewed patient's chart, specifically the encounters with Dr. Cleotilde on 4/28, 5/12 and 6/2.  He initially had an x-ray of his left ankle which showed surgical clips in the soft tissues posterior to the distal tibia and fibula near the midline.  He was started on Bactrim  and doxycycline for treatment of the wound infection on 4/28.  In the follow-up appointment on 5/12 he was instructed to continue taking the antibiotics until the wound completely healed.  He followed up on 6/2 and the wound appeared to be healing  well.  CBC is unremarkable.  CMP shows elevated glucose and very mildly elevated creatinine.  Urinalysis is unremarkable.  Will obtain an x-ray of the left ankle to evaluate for erosion of hardware and osteomyelitis.  Infection does not appear to have spread to the bone.  There are multiple surgical clips within the tissue.  I am concerned it is infected given patient's degree of pain. The patient reports the redness is spreading. He is also having a difficult time walking due to his pain.   Clinical Course as of 01/01/24 1539  Sun Jan 01, 2024  1535 Will give patient a prescription for pain medication as well.  Discussed strict return precautions including development of a fever or tachycardia.  Patient voiced understanding, questions were answered he was stable at discharge. [LD]  1535 I spoke with Dr. Cleotilde, who recommended oral antibiotics, Keflex  and Bactrim  and follow-up in the office in a couple days.  Did not feel that patient needed IV antibiotics. [LD]    Clinical Course User Index [LD] Cleaster Tinnie LABOR, PA-C      FINAL CLINICAL IMPRESSION(S) / ED DIAGNOSES   Final diagnoses:  Visit for wound check     Rx / DC Orders   ED Discharge Orders          Ordered    sulfamethoxazole -trimethoprim  (BACTRIM  DS) 800-160 MG tablet  2 times daily        01/01/24 1536    cephALEXin  (KEFLEX ) 500 MG capsule  4 times daily        01/01/24 1536    HYDROcodone -acetaminophen  (NORCO/VICODIN) 5-325 MG tablet  Every 6 hours PRN        01/01/24 1537             Note:  This document was prepared using Dragon voice recognition software and may include unintentional dictation errors.   Cleaster Tinnie LABOR, PA-C 01/01/24 1539    Willo Dunnings, MD 01/01/24 [phone]

## 2024-01-01 NOTE — ED Triage Notes (Signed)
 Patient has wound that appears on left inner ankle. Was given antibiotic and told to soak in epson salt but has not gotten better.  History same in same spot due to surgery 50 years ago   Diabetic

## 2024-01-01 NOTE — Discharge Instructions (Signed)
 Please schedule follow-up appointment with Dr. Cleotilde in a couple days.  Please take the antibiotics as prescribed.  You can take 650 mg of Tylenol  and 600 mg of ibuprofen every 6 hours as needed for pain. You can use ice, heat, muscle creams and other topical pain relievers as well.  I have sent a strong pain medication called Norco to the pharmacy.  This medication can be taken every 4-6 hours as needed for severe or breakthrough pain.  This medication can cause dependency so only take if you are unable to control your pain with other medications.  It will make you sleepy so do not drive or operate heavy machinery after taking it.  Do not drink alcohol while taking this medication.  This medication can also cause constipation so please take an over-the-counter stool softener like MiraLAX or Colace while taking it.  This medication contains both hydrocodone  and acetaminophen .  Do not take Tylenol  at the same time.  You can take the Norco or Tylenol  but not both.  Please wash your foot with soap and water daily, pat dry and then apply mupirocin  ointment topically and then apply a dressing.  Continue to soak your foot in the Epsom salt twice a day.

## 2024-01-02 ENCOUNTER — Encounter: Payer: Self-pay | Admitting: Emergency Medicine

## 2024-01-03 ENCOUNTER — Emergency Department

## 2024-01-03 ENCOUNTER — Encounter: Payer: Self-pay | Admitting: Emergency Medicine

## 2024-01-03 ENCOUNTER — Other Ambulatory Visit: Payer: Self-pay

## 2024-01-03 DIAGNOSIS — F0390 Unspecified dementia without behavioral disturbance: Secondary | ICD-10-CM | POA: Insufficient documentation

## 2024-01-03 DIAGNOSIS — Z9889 Other specified postprocedural states: Secondary | ICD-10-CM | POA: Insufficient documentation

## 2024-01-03 DIAGNOSIS — N183 Chronic kidney disease, stage 3 unspecified: Secondary | ICD-10-CM | POA: Insufficient documentation

## 2024-01-03 DIAGNOSIS — E1022 Type 1 diabetes mellitus with diabetic chronic kidney disease: Secondary | ICD-10-CM | POA: Insufficient documentation

## 2024-01-03 DIAGNOSIS — L03116 Cellulitis of left lower limb: Secondary | ICD-10-CM | POA: Insufficient documentation

## 2024-01-03 DIAGNOSIS — L089 Local infection of the skin and subcutaneous tissue, unspecified: Secondary | ICD-10-CM | POA: Insufficient documentation

## 2024-01-03 DIAGNOSIS — E039 Hypothyroidism, unspecified: Secondary | ICD-10-CM | POA: Insufficient documentation

## 2024-01-03 DIAGNOSIS — E1040 Type 1 diabetes mellitus with diabetic neuropathy, unspecified: Secondary | ICD-10-CM | POA: Insufficient documentation

## 2024-01-03 DIAGNOSIS — S81802A Unspecified open wound, left lower leg, initial encounter: Secondary | ICD-10-CM | POA: Diagnosis present

## 2024-01-03 DIAGNOSIS — Z8546 Personal history of malignant neoplasm of prostate: Secondary | ICD-10-CM | POA: Insufficient documentation

## 2024-01-03 DIAGNOSIS — M25572 Pain in left ankle and joints of left foot: Secondary | ICD-10-CM | POA: Diagnosis not present

## 2024-01-03 LAB — LACTIC ACID, PLASMA: Lactic Acid, Venous: 0.8 mmol/L (ref 0.5–1.9)

## 2024-01-03 LAB — CBC WITH DIFFERENTIAL/PLATELET
Abs Immature Granulocytes: 0.05 K/uL (ref 0.00–0.07)
Basophils Absolute: 0.1 K/uL (ref 0.0–0.1)
Basophils Relative: 1 %
Eosinophils Absolute: 0.3 K/uL (ref 0.0–0.5)
Eosinophils Relative: 5 %
HCT: 44.5 % (ref 39.0–52.0)
Hemoglobin: 14.6 g/dL (ref 13.0–17.0)
Immature Granulocytes: 1 %
Lymphocytes Relative: 28 %
Lymphs Abs: 1.9 K/uL (ref 0.7–4.0)
MCH: 29.2 pg (ref 26.0–34.0)
MCHC: 32.8 g/dL (ref 30.0–36.0)
MCV: 89 fL (ref 80.0–100.0)
Monocytes Absolute: 0.6 K/uL (ref 0.1–1.0)
Monocytes Relative: 8 %
Neutro Abs: 4 K/uL (ref 1.7–7.7)
Neutrophils Relative %: 57 %
Platelets: 200 K/uL (ref 150–400)
RBC: 5 MIL/uL (ref 4.22–5.81)
RDW: 13.3 % (ref 11.5–15.5)
WBC: 6.9 K/uL (ref 4.0–10.5)
nRBC: 0 % (ref 0.0–0.2)

## 2024-01-03 LAB — COMPREHENSIVE METABOLIC PANEL WITH GFR
ALT: 20 U/L (ref 0–44)
AST: 23 U/L (ref 15–41)
Albumin: 4.5 g/dL (ref 3.5–5.0)
Alkaline Phosphatase: 85 U/L (ref 38–126)
Anion gap: 9 (ref 5–15)
BUN: 30 mg/dL — ABNORMAL HIGH (ref 8–23)
CO2: 24 mmol/L (ref 22–32)
Calcium: 9.2 mg/dL (ref 8.9–10.3)
Chloride: 104 mmol/L (ref 98–111)
Creatinine, Ser: 1.61 mg/dL — ABNORMAL HIGH (ref 0.61–1.24)
GFR, Estimated: 45 mL/min — ABNORMAL LOW (ref >60)
Glucose, Bld: 80 mg/dL (ref 70–99)
Potassium: 4.3 mmol/L (ref 3.5–5.1)
Sodium: 137 mmol/L (ref 135–145)
Total Bilirubin: 0.8 mg/dL (ref 0.0–1.2)
Total Protein: 7.4 g/dL (ref 6.5–8.1)

## 2024-01-03 NOTE — ED Triage Notes (Signed)
 Patient to ED via POV for wound to left inner ankle. Seen for same on Sunday- given antibiotic. Wound worsening.   1 set of cultures sent to lab if needed.

## 2024-01-03 NOTE — ED Provider Triage Note (Signed)
 Emergency Medicine Provider Triage Evaluation Note  Jared Hess , a 73 y.o. male  was evaluated in triage.  Pt complains of wound check.  Was seen for the same 2 days ago.  Discharged on Bactrim  and Keflex  instructed by Dr. Cleotilde who he follows.  Returns today for worsening appearance of wound  Review of Systems  Positive:  Negative:   Physical Exam  BP 139/83 (BP Location: Left Arm)   Pulse 79   Temp (!) 97.5 F (36.4 C) (Oral)   Resp 17   Ht 6' 1.5 (1.867 m)   Wt 82.6 kg   SpO2 100%   BMI 23.69 kg/m  Gen:   Awake, no distress   Resp:  Normal effort  MSK:   Moves extremities without difficulty  Other:  Limited visualization of area of concern given Ace wrap to left ankle  Medical Decision Making  Medically screening exam initiated at 4:10 PM.  Appropriate orders placed.  Jared Hess was informed that the remainder of the evaluation will be completed by another provider, this initial triage assessment does not replace that evaluation, and the importance of remaining in the ED until their evaluation is complete.     Margrette, Milah Recht A, PA-C 01/03/24 1611

## 2024-01-04 ENCOUNTER — Observation Stay

## 2024-01-04 ENCOUNTER — Observation Stay (HOSPITAL_BASED_OUTPATIENT_CLINIC_OR_DEPARTMENT_OTHER)
Admission: EM | Admit: 2024-01-04 | Discharge: 2024-01-05 | Disposition: A | Discharge status: Home / Self Care | Source: Home / Self Care | Attending: Emergency Medicine | Admitting: Emergency Medicine

## 2024-01-04 DIAGNOSIS — F0390 Unspecified dementia without behavioral disturbance: Secondary | ICD-10-CM | POA: Diagnosis present

## 2024-01-04 DIAGNOSIS — L089 Local infection of the skin and subcutaneous tissue, unspecified: Principal | ICD-10-CM

## 2024-01-04 DIAGNOSIS — E039 Hypothyroidism, unspecified: Secondary | ICD-10-CM | POA: Diagnosis present

## 2024-01-04 DIAGNOSIS — M01X72 Direct infection of left ankle and foot in infectious and parasitic diseases classified elsewhere: Secondary | ICD-10-CM | POA: Diagnosis not present

## 2024-01-04 DIAGNOSIS — M25572 Pain in left ankle and joints of left foot: Secondary | ICD-10-CM | POA: Diagnosis not present

## 2024-01-04 DIAGNOSIS — N183 Chronic kidney disease, stage 3 unspecified: Secondary | ICD-10-CM | POA: Diagnosis present

## 2024-01-04 DIAGNOSIS — E109 Type 1 diabetes mellitus without complications: Secondary | ICD-10-CM | POA: Diagnosis present

## 2024-01-04 DIAGNOSIS — T148XXA Other injury of unspecified body region, initial encounter: Secondary | ICD-10-CM

## 2024-01-04 DIAGNOSIS — L03116 Cellulitis of left lower limb: Secondary | ICD-10-CM

## 2024-01-04 DIAGNOSIS — S81802A Unspecified open wound, left lower leg, initial encounter: Secondary | ICD-10-CM | POA: Diagnosis present

## 2024-01-04 LAB — SEDIMENTATION RATE: Sed Rate: 6 mm/h (ref 0–20)

## 2024-01-04 LAB — HEMOGLOBIN A1C
Hgb A1c MFr Bld: 7.6 % — ABNORMAL HIGH (ref 4.8–5.6)
Mean Plasma Glucose: 171.42 mg/dL

## 2024-01-04 LAB — GLUCOSE, CAPILLARY
Glucose-Capillary: 101 mg/dL — ABNORMAL HIGH (ref 70–99)
Glucose-Capillary: 103 mg/dL — ABNORMAL HIGH (ref 70–99)
Glucose-Capillary: 153 mg/dL — ABNORMAL HIGH (ref 70–99)
Glucose-Capillary: 256 mg/dL — ABNORMAL HIGH (ref 70–99)
Glucose-Capillary: 308 mg/dL — ABNORMAL HIGH (ref 70–99)
Glucose-Capillary: 46 mg/dL — ABNORMAL LOW (ref 70–99)
Glucose-Capillary: 56 mg/dL — ABNORMAL LOW (ref 70–99)

## 2024-01-04 LAB — C-REACTIVE PROTEIN: CRP: <0.5 mg/dL (ref <1.0)

## 2024-01-04 MED ORDER — VANCOMYCIN HCL IN DEXTROSE 1-5 GM/200ML-% IV SOLN: 1000.0000 mg | Freq: Once | INTRAVENOUS | Status: DC

## 2024-01-04 MED ORDER — ENOXAPARIN SODIUM 40 MG/0.4ML IJ SOSY
40.0000 mg | PREFILLED_SYRINGE | INTRAMUSCULAR | Status: DC
  Administered 2024-01-04: 40 mg via SUBCUTANEOUS
  Filled 2024-01-04: qty 0.4

## 2024-01-04 MED ORDER — INSULIN ASPART 100 UNIT/ML IJ SOLN
0.0000 [IU] | Freq: Three times a day (TID) | INTRAMUSCULAR | Status: DC
  Administered 2024-01-04: 8 [IU] via SUBCUTANEOUS
  Administered 2024-01-04: 3 [IU] via SUBCUTANEOUS
  Administered 2024-01-05: 15 [IU] via SUBCUTANEOUS
  Filled 2024-01-04 (×4): qty 1

## 2024-01-04 MED ORDER — MORPHINE SULFATE (PF) 4 MG/ML IV SOLN
4.0000 mg | Freq: Once | INTRAVENOUS | Status: AC
  Administered 2024-01-04: 4 mg via INTRAVENOUS
  Filled 2024-01-04: qty 1

## 2024-01-04 MED ORDER — INSULIN GLARGINE-YFGN 100 UNIT/ML ~~LOC~~ SOLN
20.0000 [IU] | Freq: Every day | SUBCUTANEOUS | Status: DC
  Filled 2024-01-04: qty 0.2

## 2024-01-04 MED ORDER — ONDANSETRON HCL 4 MG/2ML IJ SOLN
4.0000 mg | Freq: Once | INTRAMUSCULAR | Status: AC
  Administered 2024-01-04: 4 mg via INTRAVENOUS
  Filled 2024-01-04: qty 2

## 2024-01-04 MED ORDER — HYDROCODONE-ACETAMINOPHEN 5-325 MG PO TABS: 1.0000 | ORAL_TABLET | Freq: Four times a day (QID) | ORAL | Status: DC | PRN

## 2024-01-04 MED ORDER — VANCOMYCIN HCL 1250 MG/250ML IV SOLN
1250.0000 mg | INTRAVENOUS | Status: DC
  Administered 2024-01-05: 1250 mg via INTRAVENOUS
  Filled 2024-01-04: qty 250

## 2024-01-04 MED ORDER — ONDANSETRON HCL 4 MG/2ML IJ SOLN: 4.0000 mg | Freq: Four times a day (QID) | INTRAMUSCULAR | Status: DC | PRN

## 2024-01-04 MED ORDER — ACETAMINOPHEN 650 MG RE SUPP: 650.0000 mg | Freq: Four times a day (QID) | RECTAL | Status: DC | PRN

## 2024-01-04 MED ORDER — INSULIN ASPART 100 UNIT/ML IJ SOLN
0.0000 [IU] | Freq: Every day | INTRAMUSCULAR | Status: DC
  Administered 2024-01-04: 4 [IU] via SUBCUTANEOUS
  Filled 2024-01-04: qty 1

## 2024-01-04 MED ORDER — INSULIN GLARGINE-YFGN 100 UNIT/ML ~~LOC~~ SOLN
10.0000 [IU] | Freq: Every day | SUBCUTANEOUS | Status: DC
  Administered 2024-01-05: 10 [IU] via SUBCUTANEOUS
  Filled 2024-01-04: qty 0.1

## 2024-01-04 MED ORDER — SODIUM CHLORIDE 0.9 % IV SOLN
1.0000 g | INTRAVENOUS | Status: DC
  Administered 2024-01-04: 1 g via INTRAVENOUS
  Filled 2024-01-04 (×2): qty 10

## 2024-01-04 MED ORDER — MORPHINE SULFATE (PF) 2 MG/ML IV SOLN: 2.0000 mg | INTRAVENOUS | Status: DC | PRN

## 2024-01-04 MED ORDER — GADOBUTROL 1 MMOL/ML IV SOLN
7.5000 mL | Freq: Once | INTRAVENOUS | Status: AC | PRN
  Administered 2024-01-04: 7.5 mL via INTRAVENOUS

## 2024-01-04 MED ORDER — ACETAMINOPHEN 325 MG PO TABS: 650.0000 mg | ORAL_TABLET | Freq: Four times a day (QID) | ORAL | Status: DC | PRN

## 2024-01-04 MED ORDER — ONDANSETRON HCL 4 MG PO TABS: 4.0000 mg | ORAL_TABLET | Freq: Four times a day (QID) | ORAL | Status: DC | PRN

## 2024-01-04 MED ORDER — SODIUM CHLORIDE 0.9 % IV SOLN
2.0000 g | Freq: Once | INTRAVENOUS | Status: AC
  Administered 2024-01-04: 2 g via INTRAVENOUS
  Filled 2024-01-04: qty 12.5

## 2024-01-04 MED ORDER — VANCOMYCIN HCL 2000 MG/400ML IV SOLN
2000.0000 mg | Freq: Once | INTRAVENOUS | Status: AC
  Administered 2024-01-04: 2000 mg via INTRAVENOUS
  Filled 2024-01-04: qty 400

## 2024-01-04 MED ORDER — LEVOTHYROXINE SODIUM 112 MCG PO TABS
112.0000 ug | ORAL_TABLET | Freq: Every day | ORAL | Status: DC
  Administered 2024-01-04 – 2024-01-05 (×2): 112 ug via ORAL
  Filled 2024-01-04 (×4): qty 1

## 2024-01-04 NOTE — Plan of Care (Signed)

## 2024-01-04 NOTE — Progress Notes (Signed)
 Pharmacy Antibiotic Note  Jared Hess is a 73 y.o. male admitted on 01/04/2024 with cellulitis.  Pharmacy has been consulted for Vancomycin  dosing for 7 days.  Plan: Pt given Vancomycin  2000 mg once. Vancomycin  1250 mg IV Q 24 hrs. Goal AUC 400-550. Expected AUC: 479.1 SCr used: 1.61  Pharmacy will continue to follow and will adjust abx dosing whenever warranted.  Temp (24hrs), Avg:97.6 F (36.4 C), Min:97.5 F (36.4 C), Max:97.6 F (36.4 C)   Recent Labs  Lab 01/01/24 1341 01/03/24 1605  WBC 7.2 6.9  CREATININE 1.25* 1.61*  LATICACIDVEN  --  0.8    Estimated Creatinine Clearance: 48.3 mL/min (A) (by C-G formula based on SCr of 1.61 mg/dL (H)).    Allergies  Allergen Reactions   Armoracia Rusticana Ext (Horseradish) Itching, Nausea And Vomiting, Nausea Only and Swelling   Tree Extract Itching, Anaphylaxis and Shortness Of Breath    Walnuts-Throat closes up   [facility] Anaphylaxis, Shortness Of Breath, Itching, Nausea And Vomiting, Nausea Only and Swelling   Tomato Nausea And Vomiting, Nausea Only and Swelling    Antimicrobials this admission: 7/23 Cefepime  >> x 1 dose 7/23 Vancomycin  >> x 7 days 7/23 Ceftriaxone  >>   Microbiology results: 7/23 BCx: Pending  Thank you for allowing pharmacy to be a part of this patient's care.  Rankin CANDIE Dills, PharmD, [facility] 01/04/2024 4:17 AM

## 2024-01-04 NOTE — Consult Note (Signed)
 ORTHOPAEDIC CONSULTATION  REQUESTING PHYSICIAN: Arlon Carliss ORN, DO  ASSESSMENT AND PLAN: 73 y.o. male with the following: Left ankle infection  Patient with remote history of left ankle surgery.  No retained hardware, with the exception of surgical clips.  Has been completed, which demonstrates cellulitis.  No drainable fluid collection.  No concern for osteomyelitis  Orthopedics recommends admission to a medical service and we will provide consultation and follow along  - Weight Bearing Status/Activity: Weightbearing as tolerated  - Additional recommended labs/tests: None  -VTE Prophylaxis: As needed  - Pain control: As needed  - Follow-up plan: To be determined  -Procedures: None  Chief Complaint: Left ankle pain  HPI: Corday Wyka is a 73 y.o. male with past medical history as listed below, who presented the emergency department yesterday with complaints of a draining wound from the medial left ankle.  He describes a remote history of surgery in his left ankle greater than 50 years ago.  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  There is no retained hardware.  Starting in April of this year, he started to have some clear drainage from the medial aspect of the left ankle.  He has been followed by Dr. Cleotilde.  He has completed a couple of courses of antibiotics.  More recently, the drainage returned, and became more purulent.  In addition, his pain has been severe over the past couple of days.  He denies fevers or chills.  He was scheduled to undergo an MRI next week.  Past Medical History:  Diagnosis Date   Bilateral carpal tunnel syndrome    Chronic bilateral low back pain without sciatica    CKD (chronic kidney disease) stage 3, GFR 30-59 ml/min (HCC)    Dementia (HCC)    DM (diabetes mellitus), type 1 (HCC)    ED (erectile dysfunction)    Hypothyroidism    Neuropathy    Prostate cancer (HCC)    Thyroid disease    Past  Surgical History:  Procedure Laterality Date   BICEPT TENODESIS Right 07/14/2021   Procedure: BICEPS TENODESIS;  Surgeon: Marchia Drivers, MD;  Location: [facility] ORS;  Service: Orthopedics;  Laterality: Right;   CARPAL TUNNEL RELEASE Right 07/12/2023   Procedure: RIGHT CARPAL TUNNEL RELEASE WITH ULTRASOUND GUIDANCE;  Surgeon: Claudene Penne ORN, MD;  Location: [facility] ORS;  Service: Neurosurgery;  Laterality: Right;   CARPAL TUNNEL RELEASE Left 08/09/2023   Procedure: LEFT CARPAL TUNNEL RELEASE WITH ULTRASOUND GUIDANCE;  Surgeon: Claudene Penne ORN, MD;  Location: [facility] ORS;  Service: Neurosurgery;  Laterality: Left;   FOOT SURGERY Left    LUMBAR FUSION     PROSTATE CRYOABLATION  2010   SHOULDER ARTHROSCOPY WITH OPEN ROTATOR CUFF REPAIR AND DISTAL CLAVICLE ACROMINECTOMY Right 07/14/2021   Procedure: SHOULDER ARTHROSCOPY WITH OPEN ROTATOR CUFF REPAIR AND DISTAL CLAVICLE ACROMINECTOMY;  Surgeon: Marchia Drivers, MD;  Location: [facility] ORS;  Service: Orthopedics;  Laterality: Right;   shoulder blade surgery  2002   Social History   Socioeconomic History   Marital status: Married    Spouse name: Devere   Number of children: Not on file   Years of education: Not on file   Highest education level: Not on file  Occupational History   Not on file  Tobacco Use   Smoking status: Never   Smokeless tobacco: Never  Vaping Use   Vaping status: Never Used  Substance and Sexual Activity   Alcohol use: Not Currently   Drug use:  No   Sexual activity: Not on file  Other Topics Concern   Not on file  Social History Narrative   Not on file   Social Drivers of Health   Financial Resource Strain: Low Risk  (02/22/2023)   Received from [facility]   Overall Financial Resource Strain (CARDIA)    Difficulty of Paying Living Expenses: Not hard at all  Food Insecurity: No Food Insecurity (01/04/2024)   Hunger Vital Sign    Worried About Running Out of Food in the Last Year: Never true    Ran Out  of Food in the Last Year: Never true  Transportation Needs: No Transportation Needs (01/04/2024)   PRAPARE - Administrator, Civil Service (Medical): No    Lack of Transportation (Non-Medical): No  Physical Activity: Not on file  Stress: Not on file  Social Connections: Unknown (01/04/2024)   Social Connection and Isolation Panel    Frequency of Communication with Friends and Family: Not on file    Frequency of Social Gatherings with Friends and Family: Not on file    Attends Religious Services: Not on file    Active Member of Clubs or Organizations: Not on file    Attends Banker Meetings: Not on file    Marital Status: Married   Family History  Problem Relation Age of Onset   Prostate cancer Neg Hx    Bladder Cancer Neg Hx    Kidney cancer Neg Hx    Allergies  Allergen Reactions   Armoracia Rusticana Ext (Horseradish) Itching, Nausea And Vomiting, Nausea Only and Swelling   Tree Extract Itching, Anaphylaxis and Shortness Of Breath    Walnuts-Throat closes up   [facility] Anaphylaxis, Shortness Of Breath, Itching, Nausea And Vomiting, Nausea Only and Swelling   Tomato Nausea And Vomiting, Nausea Only and Swelling   Prior to Admission medications   Medication Sig Start Date End Date Taking? Authorizing Provider  cephALEXin  (KEFLEX ) 500 MG capsule Take 1 capsule (500 mg total) by mouth 4 (four) times daily for 7 days. 01/01/24 01/08/24  Cleaster Tinnie LABOR, PA-C  CREON 24000-76000 units CPEP Take 2 capsules by mouth 3 (three) times daily before meals. Patient not taking: Reported on 11/01/2023    [provider]  Cyanocobalamin (VITAMIN B-12) 2500 MCG SUBL Place 2,500 mcg under the tongue in the morning.    [provider]  gabapentin  (NEURONTIN ) 300 MG capsule Take 1 capsule (300 mg total) by mouth at bedtime. Please take once at night for one week. May increase to twice a day if tolerating a day Patient not taking: Reported on 11/01/2023 08/24/23    Ulis Bottcher, PA-C  HYDROcodone -acetaminophen  (NORCO/VICODIN) 5-325 MG tablet Take 1 tablet by mouth every 6 (six) hours as needed for up to 3 days for severe pain (pain score 7-10). 01/01/24 01/04/24  Cleaster Tinnie LABOR, PA-C  insulin  lispro (ADMELOG SOLOSTAR) 100 UNIT/ML KwikPen Inject 0-6 Units into the skin with breakfast, with lunch, and with evening meal. Sliding scale    [provider]  levothyroxine  (SYNTHROID ) 112 MCG tablet Take 112 mcg by mouth daily before breakfast. 01/30/19   [provider]  sulfamethoxazole -trimethoprim  (BACTRIM  DS) 800-160 MG tablet Take 1 tablet by mouth 2 (two) times daily for 7 days. 01/01/24 01/08/24  Cleaster Tinnie LABOR, PA-C  TRESIBA FLEXTOUCH 100 UNIT/ML FlexTouch Pen Inject 20 Units into the skin every morning. 04/26/21   [provider]   DG Ankle 2 Views Left Result Date:  01/03/2024 EXAM: 2 VIEW(S) XRAY OF THE LEFT ANKLE 01/03/2024 11:45:00 PM CLINICAL HISTORY: Eval for OM. Patient to ED via POV for wound to left inner ankle. Seen for same on Sunday- given antibiotic. Wound worsening. COMPARISON: None available. FINDINGS: BONES AND JOINTS: No acute fracture. No focal osseous lesion. No joint dislocation. SOFT TISSUES: Surgical clips, presumably related to prior vein harvest. No radiopaque foreign body. IMPRESSION: 1. No acute osseous abnormality. 2. No radiopaque foreign body. Electronically signed by: Pinkie Pebbles MD 01/03/2024 11:55 PM EDT RP Workstation: [id number]   Family History Reviewed and non-contributory, no pertinent history of problems with bleeding or anesthesia    Review of Systems No fevers or chills No numbness or tingling No chest pain No shortness of breath No bowel or bladder dysfunction No GI distress No headaches    OBJECTIVE  Vitals:Patient Vitals for the past 8 hrs:  BP Temp Pulse Resp SpO2 Height Weight  01/04/24 0745 110/71 97.6 F (36.4 C) 64 16 99 % -- --  01/04/24 0519 123/71 (!) 97.4  F (36.3 C) (!) 56 17 98 % 6' 1.5 (1.867 m) 83.2 kg   General: Alert, no acute distress Cardiovascular: Warm extremities noted Respiratory: No cyanosis, no use of accessory musculature GI: No organomegaly, abdomen is soft and non-tender Skin: No lesions in the area of chief complaint other than those listed below in MSK exam.  Neurologic: Sensation intact distally save for the below mentioned MSK exam Psychiatric: Patient is competent for consent with normal mood and affect Lymphatic: No swelling obvious and reported other than the area involved in the exam below Extremities   LLE:           Test Results Imaging  IMPRESSION 1. Medial subcutaneous edema with skin blistering superficial to the tarsal tunnel. There is mild subcutaneous enhancement in this area without organized fluid collection, consistent with cellulitis. 2. No evidence of deep soft tissue infection, osteomyelitis or septic arthritis. 3. Multifocal susceptibility artifact within the calcaneal body and tuberosity, likely postsurgical or posttraumatic in etiology. 4. The ankle tendons and ligaments appear intact.  Labs cbc Recent Labs    01/01/24 1341 01/03/24 1605  WBC 7.2 6.9  HGB 14.1 14.6  HCT 42.2 44.5  PLT 209 200     Recent Labs    01/01/24 1341 01/03/24 1605  NA 136 137  K 4.5 4.3  CL 104 104  CO2 25 24  GLUCOSE 146* 80  BUN 23 30*  CREATININE 1.25* 1.61*  CALCIUM 9.0 9.2

## 2024-01-04 NOTE — Assessment & Plan Note (Signed)
Renal function near baseline

## 2024-01-04 NOTE — Progress Notes (Signed)
 ED Pharmacy Antibiotic Sign Off An antibiotic consult was received from an ED provider for Cefepime  and Vancomycin  per pharmacy dosing for wound infection. A chart review was completed to assess appropriateness.   The following one time order(s) were placed:  Cefepime  2 gm Vancomycin  2000 mg per pt wt: 82.6 kg  Further antibiotic and/or antibiotic pharmacy consults should be ordered by the admitting provider if indicated.   Thank you for allowing pharmacy to be a part of this patient's care.   Thank you, Rankin CANDIE Dills, PharmD, MBA 01/04/2024 1:16 AM

## 2024-01-04 NOTE — H&P (Signed)
 History and Physical    Patient: Jared Hess FMW:[id number] DOB: 1951-05-07 DOA: 01/04/2024 DOS: the patient was seen and examined on 01/04/2024 PCP: Lenon Layman ORN, MD  Patient coming from: Home  Chief Complaint:  Chief Complaint  Patient presents with   Wound Check    HPI: Jared Hess is a 73 y.o. 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  History provided by both patient and wife at bedside.  According to wife, he has pins in his ankle from surgery 50 years ago in [place] and he has never had a problem with the wound however the wound over the medial ankle opened up in April.  He was seen by orthopedics (Dr Cleotilde of [facility]) at the time and was given a 2-week course of antibiotics.  About a month ago, he developed a clear drainage from the wound and in the past week the drainage turned yellow.  He was seen in the emergency room a few days ago and prescribed Keflex  and Bactrim .  In spite of antibiotics, wife states that the drainage from the wound is more yellow, the redness around the wound is extending and the area is exquisitely painful.  In the past 2 days he is unable to dorsiflex or plantarflex the foot.  He is still able to ambulate on the foot.  He has had no fever or chills. In the ED, bradycardic to the 50s with otherwise normal vitals.  CBC and CMP at baseline. X-ray of the left ankle nonacute. Patient started on vancomycin  and cefepime , given morphine  for pain. Admission requested     Past Medical History:  Diagnosis Date   Bilateral carpal tunnel syndrome    Chronic bilateral low back pain without sciatica    CKD (chronic kidney disease) stage 3, GFR 30-59 ml/min (HCC)    Dementia (HCC)    DM (diabetes mellitus), type 1 (HCC)    ED  (erectile dysfunction)    Hypothyroidism    Neuropathy    Prostate cancer (HCC)    Thyroid disease    Past Surgical History:  Procedure Laterality Date   BICEPT TENODESIS Right 07/14/2021   Procedure: BICEPS TENODESIS;  Surgeon: Marchia Drivers, MD;  Location: [facility] OR[facility];  Service: Orthopedics;  Laterality: Right;   CARPAL TUNNEL RELEASE Right 07/12/2023   Procedure: RIGHT CARPAL TUNNEL RELEASE WITH ULTRASOUND GUIDANCE;  Surgeon: Claudene Penne ORN, MD;  Location: [facility];  Service: Neurosurgery;  Laterality: Right;   CARPAL TUNNEL RELEASE Left 08/09/2023   Procedure: LEFT CARPAL TUNNEL RELEASE WITH ULTRASOUND GUIDANCE;  Surgeon: Claudene Penne ORN, MD;  Location: [facility] OR[facility];  Service: Neurosurgery;  Laterality: Left;   FOOT SURGERY Left    LUMBAR FUSION     PROSTATE CRYOABLATION  2010   SHOULDER ARTHROSCOPY WITH OPEN ROTATOR CUFF REPAIR AND DISTAL CLAVICLE ACROMINECTOMY Right 07/14/2021   Procedure: SHOULDER ARTHROSCOPY WITH OPEN ROTATOR CUFF REPAIR AND DISTAL CLAVICLE ACROMINECTOMY;  Surgeon: Marchia Drivers, MD;  Location: [facility] OR[facility];  Service: Orthopedics;  Laterality: Right;   shoulder blade surgery  2002   Social History:  reports that he has never smoked. He has never used smokeless tobacco. He reports that he does not currently use alcohol. He reports that he does not use drugs.  Allergies  Allergen Reactions   Armoracia Rusticana Ext (Horseradish) Itching, Nausea And Vomiting, Nausea Only and Swelling  Tree Extract Itching, Anaphylaxis and Shortness Of Breath    Walnuts-Throat closes up   [facility] Anaphylaxis, Shortness Of Breath, Itching, Nausea And Vomiting, Nausea Only and Swelling   Tomato Nausea And Vomiting, Nausea Only and Swelling    Family History  Problem Relation Age of Onset   Prostate cancer Neg Hx    Bladder Cancer Neg Hx    Kidney cancer Neg Hx     Prior to Admission medications   Medication Sig Start Date End Date Taking? Authorizing Provider  cephALEXin   (KEFLEX ) 500 MG capsule Take 1 capsule (500 mg total) by mouth 4 (four) times daily for 7 days. 01/01/24 01/08/24  Cleaster Tinnie LABOR, PA-C  CREON 24000-76000 units CPEP Take 2 capsules by mouth 3 (three) times daily before meals. Patient not taking: Reported on 11/01/2023    [provider]  Cyanocobalamin (VITAMIN B-12) 2500 MCG SUBL Place 2,500 mcg under the tongue in the morning.    [provider]  gabapentin  (NEURONTIN ) 300 MG capsule Take 1 capsule (300 mg total) by mouth at bedtime. Please take once at night for one week. May increase to twice a day if tolerating a day Patient not taking: Reported on 11/01/2023 08/24/23   Ulis Bottcher, PA-C  HYDROcodone -acetaminophen  (NORCO/VICODIN) 5-325 MG tablet Take 1 tablet by mouth every 6 (six) hours as needed for up to 3 days for severe pain (pain score 7-10). 01/01/24 01/04/24  Cleaster Tinnie LABOR, PA-C  insulin  lispro (ADMELOG SOLOSTAR) 100 UNIT/ML KwikPen Inject 0-6 Units into the skin with breakfast, with lunch, and with evening meal. Sliding scale    [provider]  levothyroxine  (SYNTHROID ) 112 MCG tablet Take 112 mcg by mouth daily before breakfast. 01/30/19   [provider]  sulfamethoxazole -trimethoprim  (BACTRIM  DS) 800-160 MG tablet Take 1 tablet by mouth 2 (two) times daily for 7 days. 01/01/24 01/08/24  Cleaster Tinnie LABOR, PA-C  TRESIBA FLEXTOUCH 100 UNIT/ML FlexTouch Pen Inject 20 Units into the skin every morning. 04/26/21   [provider]    Physical Exam: Vitals:   01/04/24 0200 01/04/24 0247 01/04/24 0300 01/04/24 0330  BP: 131/75  135/79 117/72  Pulse: (!) 54 (!) 54 (!) 55 (!) 58  Resp: 13 17 15 15   Temp:  97.6 F (36.4 C)    TempSrc:  Oral    SpO2: 99% 99% 99% 99%  Weight:      Height:       Physical Exam Vitals and nursing note reviewed.  Constitutional:      General: He is not in acute distress. HENT:     Head: Normocephalic and atraumatic.  Cardiovascular:     Rate and  Rhythm: Regular rhythm. Bradycardia present.     Heart sounds: Normal heart sounds.  Pulmonary:     Effort: Pulmonary effort is normal.     Breath sounds: Normal breath sounds.  Abdominal:     Palpations: Abdomen is soft.     Tenderness: There is no abdominal tenderness.  Musculoskeletal:     Comments: Left ankle tender to touch at medial malleolus Unable to passively dorsiflex or plantarflex left foot Dorsalis pedis palpable left foot, stronger on right See pictures below  Neurological:     Mental Status: Mental status is at baseline.        Labs on Admission: I have personally reviewed following labs and imaging studies  CBC: Recent Labs  Lab 01/01/24 1341 01/03/24 1605  WBC 7.2 6.9  NEUTROABS 4.2 4.0  HGB 14.1 14.6  HCT 42.2 44.5  MCV 87.2 89.0  PLT 209 200   Basic Metabolic Panel: Recent Labs  Lab 01/01/24 1341 01/03/24 1605  NA 136 137  K 4.5 4.3  CL 104 104  CO2 25 24  GLUCOSE 146* 80  BUN 23 30*  CREATININE 1.25* 1.61*  CALCIUM 9.0 9.2   GFR: Estimated Creatinine Clearance: 48.3 mL/min (A) (by C-G formula based on SCr of 1.61 mg/dL (H)). Liver Function Tests: Recent Labs  Lab 01/01/24 1341 01/03/24 1605  AST 28 23  ALT 21 20  ALKPHOS 82 85  BILITOT 1.1 0.8  PROT 6.6 7.4  ALBUMIN 3.9 4.5   No results for input(s): LIPASE, AMYLASE in the last 168 hours. No results for input(s): AMMONIA in the last 168 hours. Coagulation Profile: No results for input(s): INR, PROTIME in the last 168 hours. Cardiac Enzymes: No results for input(s): CKTOTAL, CKMB, CKMBINDEX, TROPONINI in the last 168 hours. BNP (last 3 results) No results for input(s): PROBNP in the last 8760 hours. HbA1C: No results for input(s): HGBA1C in the last 72 hours. CBG: No results for input(s): GLUCAP in the last 168 hours. Lipid Profile: No results for input(s): CHOL, HDL, LDLCALC, TRIG, CHOLHDL, LDLDIRECT in the last 72 hours. Thyroid  Function Tests: No results for input(s): TSH, T4TOTAL, FREET4, T3FREE, THYROIDAB in the last 72 hours. Anemia Panel: No results for input(s): VITAMINB12, FOLATE, FERRITIN, TIBC, IRON, RETICCTPCT in the last 72 hours. Urine analysis:    Component Value Date/Time   COLORURINE YELLOW (A) 01/01/2024 1341   APPEARANCEUR CLEAR (A) 01/01/2024 1341   LABSPEC 1.023 01/01/2024 1341   PHURINE 6.0 01/01/2024 1341   GLUCOSEU NEGATIVE 01/01/2024 1341   HGBUR NEGATIVE 01/01/2024 1341   BILIRUBINUR NEGATIVE 01/01/2024 1341   KETONESUR NEGATIVE 01/01/2024 1341   PROTEINUR NEGATIVE 01/01/2024 1341   NITRITE NEGATIVE 01/01/2024 1341   LEUKOCYTESUR NEGATIVE 01/01/2024 1341    Radiological Exams on Admission: DG Ankle 2 Views Left Result Date: 01/03/2024 EXAM: 2 VIEW(S) XRAY OF THE LEFT ANKLE 01/03/2024 11:45:00 PM CLINICAL HISTORY: Eval for OM. Patient to ED via POV for wound to left inner ankle. Seen for same on Sunday- given antibiotic. Wound worsening. COMPARISON: None available. FINDINGS: BONES AND JOINTS: No acute fracture. No focal osseous lesion. No joint dislocation. SOFT TISSUES: Surgical clips, presumably related to prior vein harvest. No radiopaque foreign body. IMPRESSION: 1. No acute osseous abnormality. 2. No radiopaque foreign body. Electronically signed by: Pinkie Pebbles MD 01/03/2024 11:55 PM EDT RP Workstation: [id number]   Data Reviewed for HPI: Relevant notes from primary care and specialist visits, past discharge summaries as available in EHR, including Care Everywhere. Prior diagnostic testing as pertinent to current admission diagnoses Updated medications and problem lists for reconciliation ED course, including vitals, labs, imaging, treatment and response to treatment Triage notes, nursing and pharmacy notes and ED provider's notes Notable results as noted above in HPI      Assessment and Plan: Cellulitis of left ankle Wound infection left medial  ankle History of surgery left ankle, remote Rocephin  and vancomycin  Keep leg elevated Orthopedics consult Pain control   Type 1 diabetes mellitus (HCC) Basal insulin  with sliding scale coverage  Acquired hypothyroidism Continue levothyroxine   CKD (chronic kidney disease) stage 3, GFR 30-59 ml/min (HCC) Renal function near baseline  Dementia (HCC) Delirium precautions    DVT prophylaxis: Lovenox   Consults: orthopedics  Advance Care Planning: full code  Family Communication: wife at bedside  Disposition Plan: Back to previous home environment  Severity of Illness: The appropriate patient status for this patient is OBSERVATION. Observation status is judged to be reasonable and necessary in order to provide the required intensity of service to ensure the patient's safety. The patient's presenting symptoms, physical exam findings, and initial radiographic and laboratory data in the context of their medical condition is felt to place them at decreased risk for further clinical deterioration. Furthermore, it is anticipated that the patient will be medically stable for discharge from the hospital within 2 midnights of admission.   Author: Delayne LULLA Solian, MD 01/04/2024 3:59 AM  For on call review [email or web address].

## 2024-01-04 NOTE — Assessment & Plan Note (Signed)
 Continue levothyroxine 

## 2024-01-04 NOTE — Progress Notes (Signed)
 Pt called stating his blood sugar is 52 per his blood sugar monitoring device. 2 apple juice given. FSBS 46 @ 2337 per hospital glucometer. 2 strawberry ice creams given per pt request  7/24 @ 0015 FSBS 81

## 2024-01-04 NOTE — Assessment & Plan Note (Signed)
 Delirium precautions

## 2024-01-04 NOTE — Hospital Course (Signed)
 SABRA

## 2024-01-04 NOTE — ED Provider Notes (Signed)
 [facility] Provider Note    Event Date/Time   First MD Initiated Contact with Patient 01/04/24 0008     (approximate)   History   Wound Check   HPI  Jared Hess is a 73 y.o. male past medical history significant for CKD, diabetes, neuropathy, who presents to the emergency department with a wound to his left ankle.  Patient states that he had reconstructive surgery to his left ankle approximately 50 years ago and does have hardware in place.  States that earlier this year started having erythema warmth and drainage to his left ankle.  Has been followed by Dr. Cleotilde with orthopedics.  Recently had significantly worsening of his symptoms and was evaluated in the emergency department on 01/01/2024 -they discussed with Dr. Cleotilde at that time who recommended oral antibiotics with Keflex  and Bactrim  and following up in office in a couple of days.     Physical Exam   Triage Vital Signs: ED Triage Vitals [01/03/24 1602]  Encounter Vitals Group     BP 139/83     Girls Systolic BP Percentile      Girls Diastolic BP Percentile      Boys Systolic BP Percentile      Boys Diastolic BP Percentile      Pulse Rate 79     Resp 17     Temp (!) 97.5 F (36.4 C)     Temp Source Oral     SpO2 100 %     Weight 182 lb (82.6 kg)     Height 6' 1.5 (1.867 m)     Head Circumference      Peak Flow      Pain Score 8     Pain Loc      Pain Education      Exclude from Growth Chart     Most recent vital signs: Vitals:   01/03/24 1602  BP: 139/83  Pulse: 79  Resp: 17  Temp: (!) 97.5 F (36.4 C)  SpO2: 100%    Physical Exam Constitutional:      Appearance: He is well-developed.  HENT:     Head: Atraumatic.  Eyes:     Conjunctiva/sclera: Conjunctivae normal.  Cardiovascular:     Rate and Rhythm: Regular rhythm.  Pulmonary:     Effort: No respiratory distress.  Musculoskeletal:        General: Normal range of motion.     Cervical back: Normal range of  motion.     Comments: Wound to the ankle over the medial aspect.  Does have scant amount of purulent drainage.  Significant tenderness to palpation.  No crepitance.  Erythema and some warmth.  +2 DP pulses that are equal bilaterally.  Good capillary refill and perfusion to the feet.  Skin:    General: Skin is warm.  Neurological:     Mental Status: He is alert. Mental status is at baseline.     IMPRESSION / MDM / ASSESSMENT AND PLAN / ED COURSE  I reviewed the triage vital signs and the nursing notes.  Differential diagnosis including wound infection, osteomyelitis, complex cellulitis    No tachycardic or bradycardic dysrhythmias while on cardiac telemetry.  RADIOLOGY X-ray read as no acute findings  LABS (all labs ordered are listed, but only abnormal results are displayed) Labs interpreted as -    Labs Reviewed  COMPREHENSIVE METABOLIC PANEL WITH GFR - Abnormal; Notable for the following components:      Result Value  BUN 30 (*)    Creatinine, Ser 1.61 (*)    GFR, Estimated 45 (*)    All other components within normal limits  CULTURE, BLOOD (ROUTINE X 2)  CULTURE, BLOOD (ROUTINE X 2)  LACTIC ACID, PLASMA  CBC WITH DIFFERENTIAL/PLATELET  SEDIMENTATION RATE  C-REACTIVE PROTEIN     MDM  On chart review patient is followed by Dr. Cleotilde with orthopedics for his chronic wound to his left ankle that has acutely worsened over the past 1 week.  On chart review patient has been on 2 different oral antibiotics that are appropriate with Keflex  and Bactrim  and has had worsening infection since that time.  Creatinine mildly elevated 1.6 with an elevated BUN, has a baseline of around 1.25.  No other significant electrolyte abnormality  Lab work overall reassuring with no significant leukocytosis and normal lactic acid.  Afebrile in the emergency department.  Blood cultures obtained.  Given that the patient has failed outpatient antibiotics with worsening wound to his left ankle  concern for possible osteomyelitis.  Will start the patient on antibiotics with IV vancomycin  and cefepime .  Will consult the hospitalist for admission for complex cellulitis and concern for possible osteomyelitis.  Given IV fluids and IV pain medication.  Patient does have good peripheral pulses       PROCEDURES:  Critical Care performed: yes  .Critical Care  Performed by: Suzanne Kirsch, MD Authorized by: Suzanne Kirsch, MD   Critical care provider statement:    Critical care time (minutes):  30   Critical care time was exclusive of:  Separately billable procedures and treating other patients   Critical care was time spent personally by me on the following activities:  Development of treatment plan with patient or surrogate, discussions with consultants, evaluation of patient's response to treatment, examination of patient, ordering and review of laboratory studies, ordering and review of radiographic studies, ordering and performing treatments and interventions, pulse oximetry, re-evaluation of patient's condition and review of old charts   Patient's presentation is most consistent with acute presentation with potential threat to life or bodily function.   MEDICATIONS ORDERED IN ED: Medications  ondansetron  (ZOFRAN ) injection 4 mg (has no administration in time range)  morphine  (PF) 4 MG/ML injection 4 mg (has no administration in time range)  vancomycin  (VANCOREADY) IVPB 2000 mg/400 mL (has no administration in time range)    FINAL CLINICAL IMPRESSION(S) / ED DIAGNOSES   Final diagnoses:  Wound infection     Rx / DC Orders   ED Discharge Orders     None        Note:  This document was prepared using Dragon voice recognition software and may include unintentional dictation errors.   Suzanne Kirsch, MD 01/04/24 0111

## 2024-01-04 NOTE — Inpatient Diabetes Management (Signed)
 Inpatient Diabetes Program Recommendations  AACE/ADA: New Consensus Statement on Inpatient Glycemic Control (2015)  Target Ranges:  Prepandial:   less than 140 mg/dL      Peak postprandial:   less than 180 mg/dL (1-2 hours)      Critically ill patients:  140 - 180 mg/dL   Lab Results  Component Value Date   GLUCAP 101 (H) 01/04/2024   HGBA1C 7.6 (H) 01/04/2024    Review of Glycemic Control  Latest Reference Range & Units 01/04/24 05:40 01/04/24 09:16 01/04/24 11:57  Glucose-Capillary 70 - 99 mg/dL 896 (H) 846 (H) 898 (H)   Diabetes history: DM 2 Outpatient Diabetes medications:  Amdelog 0-6 units tid with meals Tresiba 20 mg q AM Current orders for Inpatient glycemic control:  Novolog  0-15 units tid with meals and HS Semglee  20 units daily  Inpatient Diabetes Program Recommendations:   Consider reducing Semglee  to 10 units daily and reduce Novolog  correction to sensitive (0-9 units) tid with meals.   Thanks,  Randall Bullocks, RN, BC-ADM Inpatient Diabetes Coordinator Pager [phone]  (8a-5p)

## 2024-01-04 NOTE — Assessment & Plan Note (Signed)
 Wound infection left medial ankle History of surgery left ankle, remote Rocephin  and vancomycin  Keep leg elevated Orthopedics consult Pain control

## 2024-01-04 NOTE — Assessment & Plan Note (Signed)
 Basal insulin with sliding scale coverage

## 2024-01-05 DIAGNOSIS — N183 Chronic kidney disease, stage 3 unspecified: Secondary | ICD-10-CM | POA: Diagnosis not present

## 2024-01-05 DIAGNOSIS — L089 Local infection of the skin and subcutaneous tissue, unspecified: Secondary | ICD-10-CM | POA: Diagnosis not present

## 2024-01-05 DIAGNOSIS — L03116 Cellulitis of left lower limb: Secondary | ICD-10-CM | POA: Diagnosis not present

## 2024-01-05 DIAGNOSIS — T148XXA Other injury of unspecified body region, initial encounter: Secondary | ICD-10-CM | POA: Diagnosis not present

## 2024-01-05 LAB — BASIC METABOLIC PANEL WITH GFR
Anion gap: 9 (ref 5–15)
BUN: 24 mg/dL — ABNORMAL HIGH (ref 8–23)
CO2: 24 mmol/L (ref 22–32)
Calcium: 8.8 mg/dL — ABNORMAL LOW (ref 8.9–10.3)
Chloride: 102 mmol/L (ref 98–111)
Creatinine, Ser: 1.57 mg/dL — ABNORMAL HIGH (ref 0.61–1.24)
GFR, Estimated: 47 mL/min — ABNORMAL LOW (ref >60)
Glucose, Bld: 214 mg/dL — ABNORMAL HIGH (ref 70–99)
Potassium: 4.9 mmol/L (ref 3.5–5.1)
Sodium: 135 mmol/L (ref 135–145)

## 2024-01-05 LAB — GLUCOSE, CAPILLARY
Glucose-Capillary: 274 mg/dL — ABNORMAL HIGH (ref 70–99)
Glucose-Capillary: 491 mg/dL — ABNORMAL HIGH (ref 70–99)

## 2024-01-05 LAB — CBC
HCT: 40.8 % (ref 39.0–52.0)
Hemoglobin: 13.6 g/dL (ref 13.0–17.0)
MCH: 28.9 pg (ref 26.0–34.0)
MCHC: 33.3 g/dL (ref 30.0–36.0)
MCV: 86.8 fL (ref 80.0–100.0)
Platelets: 185 K/uL (ref 150–400)
RBC: 4.7 MIL/uL (ref 4.22–5.81)
RDW: 13.3 % (ref 11.5–15.5)
WBC: 5.4 K/uL (ref 4.0–10.5)
nRBC: 0 % (ref 0.0–0.2)

## 2024-01-05 LAB — MAGNESIUM: Magnesium: 1.8 mg/dL (ref 1.7–2.4)

## 2024-01-05 MED ORDER — SULFAMETHOXAZOLE-TRIMETHOPRIM 800-160 MG PO TABS: 1.0000 | ORAL_TABLET | Freq: Two times a day (BID) | ORAL | 0 refills | Status: DC

## 2024-01-05 MED ORDER — CEPHALEXIN 500 MG PO CAPS: 500.0000 mg | ORAL_CAPSULE | Freq: Four times a day (QID) | ORAL | 0 refills | Status: DC

## 2024-01-05 NOTE — Plan of Care (Signed)

## 2024-01-05 NOTE — Care Management Obs Status (Signed)
 MEDICARE OBSERVATION STATUS NOTIFICATION   Patient Details  Name: Jared Hess MRN: [id number] Date of Birth: 1951/03/17   Medicare Observation Status Notification Given:  Yes    Sundance Moise W, CMA 01/05/2024, 11:46 AM

## 2024-01-05 NOTE — Discharge Summary (Signed)
 Physician Discharge Summary   Patient: Jared Hess MRN: [id number] DOB: 12-Oct-1950  Admit date:     01/04/2024  Discharge date: 01/05/24  Discharge Physician: Carliss LELON Canales   PCP: Lenon Layman LELON, MD   Recommendations at discharge:    Pt to be discharged home.   If you experience worsening fever, chills, chest pain, shortness of breath, or other concerning symptoms, please call your PCP or go to the emergency department immediately.  Discharge Diagnoses: Principal Problem:   Wound infection Active Problems:   Cellulitis of left ankle   Dementia (HCC)   CKD (chronic kidney disease) stage 3, GFR 30-59 ml/min (HCC)   Acquired hypothyroidism   Open wound of left lower leg   Type 1 diabetes mellitus (HCC)  Resolved Problems:   * No resolved hospital problems. *   Hospital Course:   73 y.o. 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  History provided by both patient and wife at bedside.  According to wife, he has pins in his ankle from surgery 50 years ago in [place] and he has never had a problem with the wound however the wound over the medial ankle opened up in April.  He was seen by orthopedics (Dr Cleotilde of [facility]) at the time and was given a 2-week course of antibiotics.  About a month ago, he developed a clear drainage from the wound and in the past week the drainage turned yellow.  He was seen in the emergency room a few days ago and prescribed Keflex  and Bactrim .  In spite of antibiotics, wife states that the drainage from the wound is more yellow, the redness around the wound is extending and the area is exquisitely painful.  In the past 2 days he is unable to dorsiflex or plantarflex the foot.  He is still able to ambulate on the foot.  He has had  no fever or chills. In the ED, bradycardic to the 50s with otherwise normal vitals.  CBC and CMP at baseline. X-ray of the left ankle nonacute. Patient started on vancomycin  and cefepime , given morphine  for pain. Admission requested   Assessment and Plan:  Left ankle cellulitis - Worsening erythema, swelling, some discharge.  Evaluated by orthopedic surgery.  MRI showing no abscess, joint infection, osteomyelitis, only suggesting cellulitis.  Responded well to empiric IV antibiotics.  Will transition patient back to p.o. regimen of Keflex  plus Bactrim  to take for the next 10 days.  Recommend continued follow-up with previously scheduled orthopedic outpatient appointment.  Keep ankle clean and dry.  Diabetes mellitus - Continue home insulin  regimen.  CKD 3 - Appears at baseline.   Consultants: Orthopedic surgery Procedures performed: None Disposition: Home Diet recommendation:  Discharge Diet Orders (From admission, onward)     Start     Ordered   01/05/24 0000  Diet - low sodium heart healthy        01/05/24 1035           Cardiac and Carb modified diet  DISCHARGE MEDICATION: Allergies as of 01/05/2024       Reactions   Armoracia Rusticana Ext (horseradish) Itching, Nausea And Vomiting, Nausea Only, Swelling   Tree Extract Itching, Anaphylaxis, Shortness Of Breath   Walnuts-Throat closes up   [facility] Anaphylaxis, Shortness Of Breath, Itching, Nausea And Vomiting, Nausea Only, Swelling   Tomato  Nausea And Vomiting, Nausea Only, Swelling        Medication List     STOP taking these medications    HYDROcodone -acetaminophen  5-325 MG tablet Commonly known as: NORCO/VICODIN       TAKE these medications    Admelog SoloStar 100 UNIT/ML KwikPen Generic drug: insulin  lispro Inject 0-6 Units into the skin with breakfast, with lunch, and with evening meal. Sliding scale   cephALEXin  500 MG capsule Commonly known as: KEFLEX  Take 1 capsule (500 mg total) by mouth 4  (four) times daily for 10 days.   Creon 24000-76000 units Cpep Generic drug: Pancrelipase (Lip-Prot-Amyl) Take 2 capsules by mouth 3 (three) times daily before meals.   gabapentin  300 MG capsule Commonly known as: Neurontin  Take 1 capsule (300 mg total) by mouth at bedtime. Please take once at night for one week. May increase to twice a day if tolerating a day   levothyroxine  112 MCG tablet Commonly known as: SYNTHROID  Take 112 mcg by mouth daily before breakfast.   sulfamethoxazole -trimethoprim  800-160 MG tablet Commonly known as: BACTRIM  DS Take 1 tablet by mouth 2 (two) times daily for 10 days.   Tresiba FlexTouch 100 UNIT/ML FlexTouch Pen Generic drug: insulin  degludec Inject 20 Units into the skin every morning.   Vitamin B-12 2500 MCG Subl Place 2,500 mcg under the tongue in the morning.               Discharge Care Instructions  (From admission, onward)           Start     Ordered   01/05/24 0000  Discharge wound care:       Comments: Clean daily.   01/05/24 1035            Follow-up Information     Lenon Layman ORN, MD Follow up.   Specialty: Internal Medicine Why: hospital follow up Contact information: [address] [facility] [place] [address] [phone]                 Discharge Exam: Fredricka Weights   01/03/24 1602 01/04/24 0519  Weight: 82.6 kg 83.2 kg    GENERAL:  Alert, pleasant, no acute distress  HEENT:  EOMI CARDIOVASCULAR:  RRR, no murmurs appreciated RESPIRATORY:  Clear to auscultation, no wheezing, rales, or rhonchi GASTROINTESTINAL:  Soft, nontender, nondistended EXTREMITIES: Mild left ankle edema NEURO:  No new focal deficits appreciated SKIN: Left ankle with medial erythema and mild ulceration  PSYCH:  Appropriate mood and affect     Condition at discharge: improving  The results of significant diagnostics from this hospitalization (including imaging, microbiology, ancillary  and laboratory) are listed below for reference.   Imaging Studies: MR ANKLE LEFT W WO CONTRAST Result Date: 01/04/2024 CLINICAL DATA:  Soft tissue infection suspected, ankle, xray done History of diabetes and chronic kidney disease admitted with cellulitis/osteomyelitis/infected hardware of left ankle, failing outpatient management. Remote ankle surgery with draining medial ankle wound for 3 months. EXAM: MRI OF THE LEFT ANKLE WITHOUT AND WITH CONTRAST TECHNIQUE: Multiplanar, multisequence MR imaging of the ankle was performed before and after the administration of intravenous contrast. CONTRAST:  7.5mL GADAVIST  GADOBUTROL  1 MMOL/ML IV SOLN COMPARISON:  Radiographs 01/03/2024 and 01/01/2024. FINDINGS: Bones/Joint/Cartilage Multifocal susceptibility artifact within the calcaneal body and tuberosity, likely postsurgical or posttraumatic in etiology. This is suboptimally visualized on the prior radiographs due to overlapping vascular clips posteriorly and medially. No evidence of acute fracture, dislocation or osteomyelitis. No evidence of  ankle joint effusion. Mild subtalar and talonavicular degenerative changes. Ligaments The medial and lateral ankle ligaments appear intact. Mild thickening of the anterior talofibular ligament, possibly related to remote injury. Muscles and Tendons The medial flexor, peroneal, anterior extensor and Achilles tendons are intact. No significant tenosynovitis or abnormal synovial enhancement. No focal muscular abnormalities are identified. Soft tissue In correlation with the prior radiographs, there are vascular clips posteromedially in the distal lower leg and ankle. There is medial subcutaneous edema with skin blistering superficial to the tarsal tunnel. There is mild subcutaneous enhancement in this area without organized fluid collection. There is scarring in Hoffa's fat. IMPRESSION: IMPRESSION 1. Medial subcutaneous edema with skin blistering superficial to the tarsal tunnel.  There is mild subcutaneous enhancement in this area without organized fluid collection, consistent with cellulitis. 2. No evidence of deep soft tissue infection, osteomyelitis or septic arthritis. 3. Multifocal susceptibility artifact within the calcaneal body and tuberosity, likely postsurgical or posttraumatic in etiology. 4. The ankle tendons and ligaments appear intact. Electronically Signed   By: Elsie Perone M.D.   On: 01/04/2024 16:31   DG Ankle 2 Views Left Result Date: 01/03/2024 EXAM: 2 VIEW(S) XRAY OF THE LEFT ANKLE 01/03/2024 11:45:00 PM CLINICAL HISTORY: Eval for OM. Patient to ED via POV for wound to left inner ankle. Seen for same on Sunday- given antibiotic. Wound worsening. COMPARISON: None available. FINDINGS: BONES AND JOINTS: No acute fracture. No focal osseous lesion. No joint dislocation. SOFT TISSUES: Surgical clips, presumably related to prior vein harvest. No radiopaque foreign body. IMPRESSION: 1. No acute osseous abnormality. 2. No radiopaque foreign body. Electronically signed by: Pinkie Pebbles MD 01/03/2024 11:55 PM EDT RP Workstation: [id number]   DG Ankle Complete Left Result Date: 01/01/2024 CLINICAL DATA:  Provided history: Wound on medial malleolus. Previous surgery and hardware. EXAM: LEFT ANKLE COMPLETE - 3+ VIEW COMPARISON:  None Available. FINDINGS: Multiple surgical clips project over the soft tissues posterior and medially. No surgical hardware is seen. No acute or healing/healed fracture. No erosions or periostitis. The ankle mortise is preserved. Small plantar calcaneal spur. No significant joint effusion. No soft tissue gas. Radiopaque foreign body. Site of wound is not delineated by radiograph. IMPRESSION: 1. No acute osseous abnormality. The site of wound is not well delineated by radiograph. 2. Multiple surgical clips project over the soft tissues posterior and medially. No surgical hardware is seen. Electronically Signed   By: Andrea Gasman M.D.   On:  01/01/2024 15:33    Microbiology: Results for orders placed or performed during the hospital encounter of 01/04/24  Blood culture (routine x 2)     Status: None (Preliminary result)   Collection Time: 01/04/24  1:21 AM   Specimen: BLOOD  Result Value Ref Range Status   Specimen Description BLOOD LEFT ANTECUBITAL  Final   Special Requests   Final    BOTTLES DRAWN AEROBIC AND ANAEROBIC Blood Culture results may not be optimal due to an excessive volume of blood received in culture bottles   Culture   Final    NO GROWTH 1 DAY Performed at [facility], [address], [place], [place] [address]    Report Status PENDING  Incomplete  Blood culture (routine x 2)     Status: None (Preliminary result)   Collection Time: 01/04/24  1:21 AM   Specimen: BLOOD  Result Value Ref Range Status   Specimen Description BLOOD BLOOD LEFT HAND  Final   Special Requests   Final    BOTTLES  DRAWN AEROBIC AND ANAEROBIC Blood Culture results may not be optimal due to an excessive volume of blood received in culture bottles   Culture   Final    NO GROWTH 1 DAY Performed at [facility], [address] [place], [place] [address]    Report Status PENDING  Incomplete    Labs: CBC: Recent Labs  Lab 01/01/24 1341 01/03/24 1605 01/05/24 0427  WBC 7.2 6.9 5.4  NEUTROABS 4.2 4.0  --   HGB 14.1 14.6 13.6  HCT 42.2 44.5 40.8  MCV 87.2 89.0 86.8  PLT 209 200 185   Basic Metabolic Panel: Recent Labs  Lab 01/01/24 1341 01/03/24 1605 01/05/24 0427  NA 136 137 135  K 4.5 4.3 4.9  CL 104 104 102  CO2 25 24 24   GLUCOSE 146* 80 214*  BUN 23 30* 24*  CREATININE 1.25* 1.61* 1.57*  CALCIUM 9.0 9.2 8.8*  MG  --   --  1.8   Liver Function Tests: Recent Labs  Lab 01/01/24 1341 01/03/24 1605  AST 28 23  ALT 21 20  ALKPHOS 82 85  BILITOT 1.1 0.8  PROT 6.6 7.4  ALBUMIN 3.9 4.5   CBG: Recent Labs  Lab 01/04/24 1157 01/04/24 1638 01/04/24 1945 01/04/24 2337 01/05/24 0732   GLUCAP 101* 256* 308* 46* 274*    Discharge time spent: 36 minutes.  Length of inpatient stay: 0 days  Signed: Carliss LELON Canales, DO [facility] 01/05/2024

## 2024-01-06 ENCOUNTER — Other Ambulatory Visit: Payer: Self-pay

## 2024-01-06 ENCOUNTER — Inpatient Hospital Stay
Admission: EM | Admit: 2024-01-06 | Discharge: 2024-01-11 | DRG: 603 | Disposition: A | Discharge status: Home / Self Care | Attending: Internal Medicine | Admitting: Internal Medicine

## 2024-01-06 ENCOUNTER — Encounter: Payer: Self-pay | Admitting: Internal Medicine

## 2024-01-06 DIAGNOSIS — F0390 Unspecified dementia without behavioral disturbance: Secondary | ICD-10-CM | POA: Diagnosis present

## 2024-01-06 DIAGNOSIS — Z981 Arthrodesis status: Secondary | ICD-10-CM

## 2024-01-06 DIAGNOSIS — L03116 Cellulitis of left lower limb: Secondary | ICD-10-CM | POA: Diagnosis not present

## 2024-01-06 DIAGNOSIS — Z882 Allergy status to sulfonamides status: Secondary | ICD-10-CM | POA: Diagnosis not present

## 2024-01-06 DIAGNOSIS — R5381 Other malaise: Secondary | ICD-10-CM | POA: Diagnosis present

## 2024-01-06 DIAGNOSIS — Z91018 Allergy to other foods: Secondary | ICD-10-CM

## 2024-01-06 DIAGNOSIS — E1022 Type 1 diabetes mellitus with diabetic chronic kidney disease: Secondary | ICD-10-CM | POA: Diagnosis present

## 2024-01-06 DIAGNOSIS — Z794 Long term (current) use of insulin: Secondary | ICD-10-CM | POA: Diagnosis not present

## 2024-01-06 DIAGNOSIS — E1042 Type 1 diabetes mellitus with diabetic polyneuropathy: Secondary | ICD-10-CM | POA: Diagnosis present

## 2024-01-06 DIAGNOSIS — A498 Other bacterial infections of unspecified site: Secondary | ICD-10-CM

## 2024-01-06 DIAGNOSIS — E1051 Type 1 diabetes mellitus with diabetic peripheral angiopathy without gangrene: Secondary | ICD-10-CM | POA: Diagnosis present

## 2024-01-06 DIAGNOSIS — S91002S Unspecified open wound, left ankle, sequela: Secondary | ICD-10-CM | POA: Diagnosis not present

## 2024-01-06 DIAGNOSIS — E109 Type 1 diabetes mellitus without complications: Secondary | ICD-10-CM | POA: Diagnosis present

## 2024-01-06 DIAGNOSIS — N179 Acute kidney failure, unspecified: Secondary | ICD-10-CM | POA: Diagnosis present

## 2024-01-06 DIAGNOSIS — E039 Hypothyroidism, unspecified: Secondary | ICD-10-CM | POA: Diagnosis present

## 2024-01-06 DIAGNOSIS — Z79890 Hormone replacement therapy (postmenopausal): Secondary | ICD-10-CM | POA: Diagnosis not present

## 2024-01-06 DIAGNOSIS — G629 Polyneuropathy, unspecified: Secondary | ICD-10-CM

## 2024-01-06 DIAGNOSIS — M25572 Pain in left ankle and joints of left foot: Secondary | ICD-10-CM | POA: Diagnosis present

## 2024-01-06 DIAGNOSIS — Z79899 Other long term (current) drug therapy: Secondary | ICD-10-CM

## 2024-01-06 DIAGNOSIS — Z833 Family history of diabetes mellitus: Secondary | ICD-10-CM | POA: Diagnosis not present

## 2024-01-06 DIAGNOSIS — Z91048 Other nonmedicinal substance allergy status: Secondary | ICD-10-CM | POA: Diagnosis not present

## 2024-01-06 DIAGNOSIS — M545 Low back pain, unspecified: Secondary | ICD-10-CM | POA: Diagnosis present

## 2024-01-06 DIAGNOSIS — R4189 Other symptoms and signs involving cognitive functions and awareness: Secondary | ICD-10-CM | POA: Diagnosis present

## 2024-01-06 DIAGNOSIS — R238 Other skin changes: Secondary | ICD-10-CM | POA: Diagnosis present

## 2024-01-06 DIAGNOSIS — G90522 Complex regional pain syndrome I of left lower limb: Secondary | ICD-10-CM | POA: Diagnosis present

## 2024-01-06 DIAGNOSIS — R5383 Other fatigue: Secondary | ICD-10-CM | POA: Diagnosis present

## 2024-01-06 DIAGNOSIS — B952 Enterococcus as the cause of diseases classified elsewhere: Secondary | ICD-10-CM | POA: Diagnosis not present

## 2024-01-06 DIAGNOSIS — Z8546 Personal history of malignant neoplasm of prostate: Secondary | ICD-10-CM | POA: Diagnosis not present

## 2024-01-06 DIAGNOSIS — L089 Local infection of the skin and subcutaneous tissue, unspecified: Principal | ICD-10-CM | POA: Diagnosis present

## 2024-01-06 DIAGNOSIS — S91002D Unspecified open wound, left ankle, subsequent encounter: Secondary | ICD-10-CM | POA: Diagnosis not present

## 2024-01-06 DIAGNOSIS — L0390 Cellulitis, unspecified: Secondary | ICD-10-CM | POA: Diagnosis present

## 2024-01-06 DIAGNOSIS — G2582 Stiff-man syndrome: Secondary | ICD-10-CM | POA: Diagnosis not present

## 2024-01-06 DIAGNOSIS — R21 Rash and other nonspecific skin eruption: Secondary | ICD-10-CM | POA: Insufficient documentation

## 2024-01-06 DIAGNOSIS — H5500 Unspecified nystagmus: Secondary | ICD-10-CM | POA: Diagnosis present

## 2024-01-06 DIAGNOSIS — N1831 Chronic kidney disease, stage 3a: Secondary | ICD-10-CM | POA: Diagnosis present

## 2024-01-06 LAB — CBC WITH DIFFERENTIAL/PLATELET
Abs Immature Granulocytes: 0.07 K/uL (ref 0.00–0.07)
Basophils Absolute: 0.1 K/uL (ref 0.0–0.1)
Basophils Relative: 1 %
Eosinophils Absolute: 0.4 K/uL (ref 0.0–0.5)
Eosinophils Relative: 4 %
HCT: 47.4 % (ref 39.0–52.0)
Hemoglobin: 15.8 g/dL (ref 13.0–17.0)
Immature Granulocytes: 1 %
Lymphocytes Relative: 24 %
Lymphs Abs: 2 K/uL (ref 0.7–4.0)
MCH: 28.9 pg (ref 26.0–34.0)
MCHC: 33.3 g/dL (ref 30.0–36.0)
MCV: 86.8 fL (ref 80.0–100.0)
Monocytes Absolute: 0.7 K/uL (ref 0.1–1.0)
Monocytes Relative: 9 %
Neutro Abs: 5 K/uL (ref 1.7–7.7)
Neutrophils Relative %: 61 %
Platelets: 235 K/uL (ref 150–400)
RBC: 5.46 MIL/uL (ref 4.22–5.81)
RDW: 13.2 % (ref 11.5–15.5)
WBC: 8.2 K/uL (ref 4.0–10.5)
nRBC: 0 % (ref 0.0–0.2)

## 2024-01-06 LAB — COMPREHENSIVE METABOLIC PANEL WITH GFR
ALT: 16 U/L (ref 0–44)
AST: 18 U/L (ref 15–41)
Albumin: 4 g/dL (ref 3.5–5.0)
Alkaline Phosphatase: 92 U/L (ref 38–126)
Anion gap: 14 (ref 5–15)
BUN: 38 mg/dL — ABNORMAL HIGH (ref 8–23)
CO2: 20 mmol/L — ABNORMAL LOW (ref 22–32)
Calcium: 9.5 mg/dL (ref 8.9–10.3)
Chloride: 101 mmol/L (ref 98–111)
Creatinine, Ser: 1.83 mg/dL — ABNORMAL HIGH (ref 0.61–1.24)
GFR, Estimated: 39 mL/min — ABNORMAL LOW (ref >60)
Glucose, Bld: 319 mg/dL — ABNORMAL HIGH (ref 70–99)
Potassium: 4.8 mmol/L (ref 3.5–5.1)
Sodium: 135 mmol/L (ref 135–145)
Total Bilirubin: 0.9 mg/dL (ref 0.0–1.2)
Total Protein: 6.8 g/dL (ref 6.5–8.1)

## 2024-01-06 LAB — URINALYSIS, W/ REFLEX TO CULTURE (INFECTION SUSPECTED)
Bacteria, UA: NONE SEEN
Bilirubin Urine: NEGATIVE
Glucose, UA: NEGATIVE mg/dL
Hgb urine dipstick: NEGATIVE
Ketones, ur: NEGATIVE mg/dL
Leukocytes,Ua: NEGATIVE
Nitrite: NEGATIVE
Protein, ur: 30 mg/dL — AB
Specific Gravity, Urine: 1.016 (ref 1.005–1.030)
pH: 5 (ref 5.0–8.0)

## 2024-01-06 LAB — LACTIC ACID, PLASMA: Lactic Acid, Venous: 1.3 mmol/L (ref 0.5–1.9)

## 2024-01-06 LAB — GLUCOSE, CAPILLARY: Glucose-Capillary: 142 mg/dL — ABNORMAL HIGH (ref 70–99)

## 2024-01-06 MED ORDER — INSULIN ASPART 100 UNIT/ML IJ SOLN
0.0000 [IU] | Freq: Every day | INTRAMUSCULAR | Status: DC
  Administered 2024-01-08: 4 [IU] via SUBCUTANEOUS
  Filled 2024-01-06: qty 1

## 2024-01-06 MED ORDER — LINEZOLID 600 MG PO TABS: 600.0000 mg | ORAL_TABLET | Freq: Two times a day (BID) | ORAL | Status: DC

## 2024-01-06 MED ORDER — SODIUM CHLORIDE 0.9 % IV SOLN: INTRAVENOUS | Status: AC

## 2024-01-06 MED ORDER — OXYCODONE HCL 5 MG PO TABS: 5.0000 mg | ORAL_TABLET | Freq: Four times a day (QID) | ORAL | Status: AC | PRN

## 2024-01-06 MED ORDER — MORPHINE SULFATE (PF) 4 MG/ML IV SOLN
4.0000 mg | Freq: Once | INTRAVENOUS | Status: AC
  Administered 2024-01-06: 4 mg via INTRAVENOUS
  Filled 2024-01-06: qty 1

## 2024-01-06 MED ORDER — INSULIN GLARGINE-YFGN 100 UNIT/ML ~~LOC~~ SOLN
16.0000 [IU] | SUBCUTANEOUS | Status: DC
  Administered 2024-01-07 – 2024-01-11 (×5): 16 [IU] via SUBCUTANEOUS
  Filled 2024-01-06 (×5): qty 0.16

## 2024-01-06 MED ORDER — ONDANSETRON HCL 4 MG/2ML IJ SOLN: 4.0000 mg | Freq: Four times a day (QID) | INTRAMUSCULAR | Status: DC | PRN

## 2024-01-06 MED ORDER — ONDANSETRON HCL 4 MG PO TABS: 4.0000 mg | ORAL_TABLET | Freq: Four times a day (QID) | ORAL | Status: DC | PRN

## 2024-01-06 MED ORDER — HEPARIN SODIUM (PORCINE) 5000 UNIT/ML IJ SOLN
5000.0000 [IU] | Freq: Three times a day (TID) | INTRAMUSCULAR | Status: DC
  Administered 2024-01-06 – 2024-01-11 (×14): 5000 [IU] via SUBCUTANEOUS
  Filled 2024-01-06 (×14): qty 1

## 2024-01-06 MED ORDER — DIPHENHYDRAMINE HCL 50 MG/ML IJ SOLN: 25.0000 mg | Freq: Four times a day (QID) | INTRAMUSCULAR | Status: DC | PRN

## 2024-01-06 MED ORDER — DIPHENHYDRAMINE HCL 50 MG/ML IJ SOLN
25.0000 mg | Freq: Four times a day (QID) | INTRAMUSCULAR | Status: AC | PRN
  Administered 2024-01-07 – 2024-01-11 (×2): 25 mg via INTRAVENOUS
  Filled 2024-01-06 (×2): qty 1

## 2024-01-06 MED ORDER — ACETAMINOPHEN 325 MG PO TABS
650.0000 mg | ORAL_TABLET | Freq: Four times a day (QID) | ORAL | Status: DC | PRN
Start: 2024-01-06 — End: 2024-01-11
  Administered 2024-01-07 – 2024-01-10 (×4): 650 mg via ORAL
  Filled 2024-01-06 (×4): qty 2

## 2024-01-06 MED ORDER — PIPERACILLIN-TAZOBACTAM 3.375 G IVPB
3.3750 g | Freq: Three times a day (TID) | INTRAVENOUS | Status: DC
  Administered 2024-01-07 – 2024-01-09 (×8): 3.375 g via INTRAVENOUS
  Filled 2024-01-06 (×8): qty 50

## 2024-01-06 MED ORDER — MORPHINE SULFATE (PF) 2 MG/ML IV SOLN: 2.0000 mg | INTRAVENOUS | Status: AC | PRN

## 2024-01-06 MED ORDER — LEVOTHYROXINE SODIUM 112 MCG PO TABS
112.0000 ug | ORAL_TABLET | Freq: Every day | ORAL | Status: DC
Start: 2024-01-07 — End: 2024-01-11
  Administered 2024-01-07 – 2024-01-11 (×5): 112 ug via ORAL
  Filled 2024-01-06 (×6): qty 1

## 2024-01-06 MED ORDER — PIPERACILLIN-TAZOBACTAM 3.375 G IVPB
3.3750 g | Freq: Once | INTRAVENOUS | Status: AC
  Administered 2024-01-06: 3.375 g via INTRAVENOUS
  Filled 2024-01-06: qty 50

## 2024-01-06 MED ORDER — MORPHINE SULFATE (PF) 4 MG/ML IV SOLN: 4.0000 mg | INTRAVENOUS | Status: AC | PRN

## 2024-01-06 MED ORDER — VANCOMYCIN HCL IN DEXTROSE 1-5 GM/200ML-% IV SOLN
1000.0000 mg | Freq: Once | INTRAVENOUS | Status: AC
  Administered 2024-01-06: 1000 mg via INTRAVENOUS
  Filled 2024-01-06: qty 200

## 2024-01-06 MED ORDER — LINEZOLID 600 MG/300ML IV SOLN
600.0000 mg | Freq: Two times a day (BID) | INTRAVENOUS | Status: DC
  Administered 2024-01-07 – 2024-01-09 (×5): 600 mg via INTRAVENOUS
  Filled 2024-01-06 (×8): qty 300

## 2024-01-06 MED ORDER — VANCOMYCIN HCL IN DEXTROSE 1-5 GM/200ML-% IV SOLN: 1000.0000 mg | INTRAVENOUS | Status: DC

## 2024-01-06 MED ORDER — INSULIN ASPART 100 UNIT/ML IJ SOLN
0.0000 [IU] | Freq: Three times a day (TID) | INTRAMUSCULAR | Status: DC
  Administered 2024-01-07: 2 [IU] via SUBCUTANEOUS
  Filled 2024-01-06: qty 1

## 2024-01-06 MED ORDER — ACETAMINOPHEN 650 MG RE SUPP: 650.0000 mg | Freq: Four times a day (QID) | RECTAL | Status: DC | PRN

## 2024-01-06 NOTE — Hospital Course (Signed)
 [name] is a 73 year old male with history of insulin -dependent diabetes mellitus type 1, hypothyroid, who presents ED for chief concerns of left lower extremity redness and pain.  He reports his symptoms and the rash has gotten worse after leaving the hospital.  He endorses compliance with home p.o. antibiotics.  Vitals in the ED showed T of 97.8, respiration rate 19, heart rate 58, blood pressure 124/84, SpO2 98% on room air.  Serum sodium is 135, potassium 4.8, chloride 101, bicarb 20, BUN of 38, serum creatinine 1.83, GFR 39, nonfasting glucose 319,, WBC 8.2, hemoglobin 15.8, platelets of 235.  Lactic acid 1.3.  ED treatment: Zosyn, vancomycin , morphine  4 mg IV one-time dose
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
## 2024-01-06 NOTE — Assessment & Plan Note (Addendum)
 Failing outpatient therapy: Keflex  and Bactrim  Will continue with Zosyn and vancomycin  per pharmacy Podiatry has been consulted and recommends an ABI ABI has been ordered, a.m. team to consult vascular pending ABI Telemetry medical, inpatient

## 2024-01-06 NOTE — ED Triage Notes (Signed)
 Ankle wound.  Has been on IV antibiotic x 2 days.  Seen for same on 01/03/2024.  Today pain to left foot worsening and draining.  Had an MRI on Wednesday, showing infection to tissue from knee/down.

## 2024-01-06 NOTE — Consult Note (Addendum)
 PODIATRY / FOOT AND ANKLE SURGERY CONSULTATION NOTE  Requesting Physician: Dr. Willo  Reason for consult: Left medial ankle wound/cellulitis   HPI: Jared Hess is a 73 y.o. male who presents with a now somewhat chronic wound to the left medial ankle.  He has been seen by orthopedics and has been given multiple rounds of antibiotics but continues to get redness and swelling around the left medial ankle with an opening of the wound.  Patient has a complicated history of complex surgery performed due to an injury that was many years ago.  He notes that he had pins in his ankle/foot and he broke his heel bone.  Patient has had x-ray imaging and MRI imaging performed which does not show deep infection present.  Patient's lab work also is reassuring as CBC and ESR/CRP are within normal limits.  Patient has a feeling that he cannot move his left ankle or foot.  He has been unable to walk on the left foot and ankle due to pain discomfort.  He is also noticed some tingling going up his leg into his knee from the left medial ankle extending up to the knee area.  He does have a history of type 1 diabetes, within the past 3 years.  Patient's most recent hemoglobin A1c was 7.6.  He presents today resting in ER bed fairly comfortably but still having pain to the left ankle.  He also notes that he nicked the back of the left Achilles area and has a small cut to this area 2.  PMHx:  Past Medical History:  Diagnosis Date   Bilateral carpal tunnel syndrome    Chronic bilateral low back pain without sciatica    CKD (chronic kidney disease) stage 3, GFR 30-59 ml/min (HCC)    Dementia (HCC)    DM (diabetes mellitus), type 1 (HCC)    ED (erectile dysfunction)    Hypothyroidism    Neuropathy    Prostate cancer (HCC)    Thyroid disease     Surgical Hx:  Past Surgical History:  Procedure Laterality Date   BICEPT TENODESIS Right 07/14/2021   Procedure: BICEPS TENODESIS;  Surgeon: Marchia Drivers, MD;   Location: [facility];  Service: Orthopedics;  Laterality: Right;   CARPAL TUNNEL RELEASE Right 07/12/2023   Procedure: RIGHT CARPAL TUNNEL RELEASE WITH ULTRASOUND GUIDANCE;  Surgeon: Claudene Penne ORN, MD;  Location: [facility];  Service: Neurosurgery;  Laterality: Right;   CARPAL TUNNEL RELEASE Left 08/09/2023   Procedure: LEFT CARPAL TUNNEL RELEASE WITH ULTRASOUND GUIDANCE;  Surgeon: Claudene Penne ORN, MD;  Location: [facility];  Service: Neurosurgery;  Laterality: Left;   FOOT SURGERY Left    LUMBAR FUSION     PROSTATE CRYOABLATION  2010   SHOULDER ARTHROSCOPY WITH OPEN ROTATOR CUFF REPAIR AND DISTAL CLAVICLE ACROMINECTOMY Right 07/14/2021   Procedure: SHOULDER ARTHROSCOPY WITH OPEN ROTATOR CUFF REPAIR AND DISTAL CLAVICLE ACROMINECTOMY;  Surgeon: Marchia Drivers, MD;  Location: [facility];  Service: Orthopedics;  Laterality: Right;   shoulder blade surgery  2002    FHx:  Family History  Problem Relation Age of Onset   Diabetes Sister    Prostate cancer Neg Hx    Bladder Cancer Neg Hx    Kidney cancer Neg Hx     Social History:  reports that he has never smoked. He has never used smokeless tobacco. He reports that he does not currently use alcohol. He reports that he does not use drugs.  Allergies:  Allergies  Allergen Reactions  Armoracia Rusticana Ext (Horseradish) Itching, Nausea And Vomiting, Nausea Only and Swelling   Tree Extract Itching, Anaphylaxis and Shortness Of Breath    Walnuts-Throat closes up   [facility] Anaphylaxis, Shortness Of Breath, Itching, Nausea And Vomiting, Nausea Only and Swelling   Tomato Nausea And Vomiting, Nausea Only and Swelling    (Not in a hospital admission)   Physical Exam: General: Alert and oriented.  No apparent distress.  Vascular: DP pulse right palpable, left nonpalpable; PT pulse right nonpalpable, left nonpalpable, capillary fill time appears to be intact to digits bilaterally, somewhat slight violaceous appearance present to bilateral lower  extremities, spider veins present.  Mild bilateral lower extremity nonpitting edema.  Neuro: Tinel's sign positive left tibial nerve distribution which appears to be hypersensitive.  Derm: Left medial ankle with superficial blister present to the area which appears to be filled with serous fluid, no purulence, no obvious fluctuance present to the area, some surrounding erythema and edema present to this area that extends to the anterior ankle from the medial ankle but does not appear to be streaking up the leg further.    MSK: Exquisitely tender with any palpation to the left foot and ankle.  Reduced motion about the left foot and ankle, that produces pain with attempted motion.  Results for orders placed or performed during the hospital encounter of 01/06/24 (from the past 48 hours)  Lactic acid, plasma     Status: None   Collection Time: 01/06/24  1:07 PM  Result Value Ref Range   Lactic Acid, Venous 1.3 0.5 - 1.9 mmol/L    Comment: Performed at [facility], [address] [place], [place] [address]  Comprehensive metabolic panel     Status: Abnormal   Collection Time: 01/06/24  1:07 PM  Result Value Ref Range   Sodium 135 135 - 145 mmol/L   Potassium 4.8 3.5 - 5.1 mmol/L   Chloride 101 98 - 111 mmol/L   CO2 20 (L) 22 - 32 mmol/L   Glucose, Bld 319 (H) 70 - 99 mg/dL    Comment: Glucose reference range applies only to samples taken after fasting for at least 8 hours.   BUN 38 (H) 8 - 23 mg/dL   Creatinine, Ser 8.16 (H) 0.61 - 1.24 mg/dL   Calcium 9.5 8.9 - 89.6 mg/dL   Total Protein 6.8 6.5 - 8.1 g/dL   Albumin 4.0 3.5 - 5.0 g/dL   AST 18 15 - 41 U/L   ALT 16 0 - 44 U/L   Alkaline Phosphatase 92 38 - 126 U/L   Total Bilirubin 0.9 0.0 - 1.2 mg/dL   GFR, Estimated 39 (L) >60 mL/min    Comment: (NOTE) Calculated using the CKD-EPI Creatinine Equation (2021)    Anion gap 14 5 - 15    Comment: Performed at [facility], [address] [place], [place]  [address]  CBC with Differential     Status: None   Collection Time: 01/06/24  1:07 PM  Result Value Ref Range   WBC 8.2 4.0 - 10.5 K/uL   RBC 5.46 4.22 - 5.81 MIL/uL   Hemoglobin 15.8 13.0 - 17.0 g/dL   HCT 52.5 60.9 - 47.9 %   MCV 86.8 80.0 - 100.0 fL   MCH 28.9 26.0 - 34.0 pg   MCHC 33.3 30.0 - 36.0 g/dL   RDW 86.7 88.4 - 84.4 %   Platelets 235 150 - 400 K/uL   nRBC 0.0 0.0 - 0.2 %  Neutrophils Relative % 61 %   Neutro Abs 5.0 1.7 - 7.7 K/uL   Lymphocytes Relative 24 %   Lymphs Abs 2.0 0.7 - 4.0 K/uL   Monocytes Relative 9 %   Monocytes Absolute 0.7 0.1 - 1.0 K/uL   Eosinophils Relative 4 %   Eosinophils Absolute 0.4 0.0 - 0.5 K/uL   Basophils Relative 1 %   Basophils Absolute 0.1 0.0 - 0.1 K/uL   Immature Granulocytes 1 %   Abs Immature Granulocytes 0.07 0.00 - 0.07 K/uL    Comment: Performed at [facility], [address] [place], [place] [address]   No results found.  Blood pressure 124/84, pulse (!) 58, temperature 97.8 F (36.6 C), temperature source Oral, resp. rate 16, weight 83.2 kg, SpO2 98%.  Assessment Venous/arterial type ulceration, superficial present to the left medial ankle with likely some degree of cellulitis Diabetes type 1 with polyneuropathy PVD  Plan - Patient seen and examined - Previous x-ray imaging and MRI imaging reviewed and discussed with patient in detail and family.  No signs for deeper infection based on MRI. - Clinically appears to have a small ulceration/blister present to the left medial ankle with corresponding cellulitis.  Concerned that patient does not have strong palpable pulses to the left lower extremity.  Both feet appear to be somewhat cool to touch and have slight violaceous appearance.  Patient displays no signs of rest pain/claudication with rest.  Has hair growth present from the calf upwards but hair growth is reduced from toes to proximal. - Consult placed with vascular surgery.  Discussed case with Dr. Laurence.   Appreciate recommendations. - Wound culture taken today from blister type fluid present to the left medial ankle.  Sent off.  WBC was within normal limits, ESR/CRP was within normal limits.  Unlikely no osteomyelitis present based on lab findings as well as MRI/x-ray findings.  No evidence clinically or with radiographic imaging for abscess.  Appreciate medicine recommendations for antibiotic therapy. - Discussed with patient surgery be somewhat exploratory and recommend avoiding this due to concerns for peripheral vascular disease and due to previous surgery that was performed to the area.  Likely to have a lot of scar tissue and would be difficult to explore the tissues due to scarring as well as concern for damage to vasculature as well as nerve.  Recommend keeping this as last resort.  If intensive workup does not reveal further findings could consider exploratory I&D in the future.  Would like to give this through the weekend. - For now applied Xeroform to the area of the blister site followed by 4 x 4 gauze, gauze roll from forefoot to below knee and applied Ace wrap from forefoot to below knee for mild to moderate compression to try to help with some of the swelling.  Will treat this as a venous type of ulceration for now and see if it improves with compression therapy.  Again would like to have vascular assess this to make sure patient has enough circulation to heal the wound and compression does not compromise circulation further.  If noticing further discoloration to lower extremities with worsening and violaceous appearance or dusky type changes would recommend stat vascular consultation but as it stands right now I do not see these changes. - Could consider consulting orthopedics again for another set of eyes to the area for another opinion.  They have seen him in the past for this a few times now.  Prentice Lee, DPM 01/06/2024,  6:59 PM

## 2024-01-06 NOTE — Consult Note (Signed)
 VASCULAR SURGERY CONSULTATION   Requested by:  Prentice Lee, DPM  Reason for consultation: possible LEFT leg vascular disease    History of Present Illness   Jared Hess is a 73 y.o. (June 09, 1951) male who presents with cc: LEFT ankle wound.  Pt has complicated LEFT leg and back trauma history.  Pt reported having recurrent LEFT ankle wound with frank pus drainage.  Pt has already had some hardware removed from LEFT ankle.  Reportedly patient developed Type I DM in the last 1-2 years.  Pt notes some increased pain LEFT ankle.  He chronically has decreased sensation in LEFT ankle.  Patient has been active and denies any significant functional impairment in the LEFT leg.  Past Medical History:  Diagnosis Date   Bilateral carpal tunnel syndrome    Chronic bilateral low back pain without sciatica    CKD (chronic kidney disease) stage 3, GFR 30-59 ml/min (HCC)    Dementia (HCC)    DM (diabetes mellitus), type 1 (HCC)    ED (erectile dysfunction)    Hypothyroidism    Neuropathy    Prostate cancer (HCC)    Thyroid disease     Past Surgical History:  Procedure Laterality Date   BICEPT TENODESIS Right 07/14/2021   Procedure: BICEPS TENODESIS;  Surgeon: Marchia Drivers, MD;  Location: [facility];  Service: Orthopedics;  Laterality: Right;   CARPAL TUNNEL RELEASE Right 07/12/2023   Procedure: RIGHT CARPAL TUNNEL RELEASE WITH ULTRASOUND GUIDANCE;  Surgeon: Claudene Penne ORN, MD;  Location: [facility];  Service: Neurosurgery;  Laterality: Right;   CARPAL TUNNEL RELEASE Left 08/09/2023   Procedure: LEFT CARPAL TUNNEL RELEASE WITH ULTRASOUND GUIDANCE;  Surgeon: Claudene Penne ORN, MD;  Location: [facility];  Service: Neurosurgery;  Laterality: Left;   FOOT SURGERY Left    LUMBAR FUSION     PROSTATE CRYOABLATION  2010   SHOULDER ARTHROSCOPY WITH OPEN ROTATOR CUFF REPAIR AND DISTAL CLAVICLE ACROMINECTOMY Right 07/14/2021   Procedure: SHOULDER ARTHROSCOPY WITH OPEN ROTATOR CUFF REPAIR AND  DISTAL CLAVICLE ACROMINECTOMY;  Surgeon: Marchia Drivers, MD;  Location: [facility];  Service: Orthopedics;  Laterality: Right;   shoulder blade surgery  2002     Social History   Socioeconomic History   Marital status: Married    Spouse name: Devere   Number of children: Not on file   Years of education: Not on file   Highest education level: Not on file  Occupational History   Not on file  Tobacco Use   Smoking status: Never   Smokeless tobacco: Never  Vaping Use   Vaping status: Never Used  Substance and Sexual Activity   Alcohol use: Not Currently   Drug use: No   Sexual activity: Yes    Partners: Female  Other Topics Concern   Not on file  Social History Narrative   Not on file   Social Drivers of Health   Financial Resource Strain: Low Risk  (02/22/2023)   Received from [facility]   Overall Financial Resource Strain (CARDIA)    Difficulty of Paying Living Expenses: Not hard at all  Food Insecurity: No Food Insecurity (01/04/2024)   Hunger Vital Sign    Worried About Running Out of Food in the Last Year: Never true    Ran Out of Food in the Last Year: Never true  Transportation Needs: No Transportation Needs (01/04/2024)   PRAPARE - Transportation    Lack of Transportation (Medical): No    Lack of  Transportation (Non-Medical): No  Physical Activity: Not on file  Stress: Not on file  Social Connections: Unknown (01/04/2024)   Social Connection and Isolation Panel    Frequency of Communication with Friends and Family: Not on file    Frequency of Social Gatherings with Friends and Family: Not on file    Attends Religious Services: Not on file    Active Member of Clubs or Organizations: Not on file    Attends Banker Meetings: Not on file    Marital Status: Married  Intimate Partner Violence: Not At Risk (01/04/2024)   Humiliation, Afraid, Rape, and Kick questionnaire    Fear of Current or Ex-Partner: No    Emotionally Abused: No     Physically Abused: No    Sexually Abused: No    Family History  Problem Relation Age of Onset   Diabetes Sister    Prostate cancer Neg Hx    Bladder Cancer Neg Hx    Kidney cancer Neg Hx     Current Facility-Administered Medications  Medication Dose Route Frequency Provider Last Rate Last Admin   0.9 %  sodium chloride  infusion   Intravenous Continuous Cox, Amy N, DO 125 mL/hr at 01/06/24 1939 New Bag at 01/06/24 1939   acetaminophen  (TYLENOL ) tablet 650 mg  650 mg Oral Q6H PRN Cox, Amy N, DO       Or   acetaminophen  (TYLENOL ) suppository 650 mg  650 mg Rectal Q6H PRN Cox, Amy N, DO       diphenhydrAMINE (BENADRYL) injection 25 mg  25 mg Intravenous Q6H PRN Cox, Amy N, DO       heparin injection 5,000 Units  5,000 Units Subcutaneous Q8H Cox, Amy N, DO       [START ON 01/07/2024] insulin  aspart (novoLOG ) injection 0-15 Units  0-15 Units Subcutaneous TID WC Cox, Amy N, DO       insulin  aspart (novoLOG ) injection 0-5 Units  0-5 Units Subcutaneous QHS Cox, Amy N, DO       [START ON 01/07/2024] insulin  glargine-yfgn (SEMGLEE ) injection 16 Units  16 Units Subcutaneous BH-q7a Cox, Amy N, DO       [START ON 01/07/2024] levothyroxine  (SYNTHROID ) tablet 112 mcg  112 mcg Oral QAC breakfast Cox, Amy N, DO       [START ON 01/07/2024] linezolid (ZYVOX) IVPB 600 mg  600 mg Intravenous Q12H Ravishankar, Donald, MD       morphine  (PF) 2 MG/ML injection 2 mg  2 mg Intravenous Q4H PRN Cox, Amy N, DO       morphine  (PF) 4 MG/ML injection 4 mg  4 mg Intravenous Q4H PRN Cox, Amy N, DO       ondansetron  (ZOFRAN ) tablet 4 mg  4 mg Oral Q6H PRN Cox, Amy N, DO       Or   ondansetron  (ZOFRAN ) injection 4 mg  4 mg Intravenous Q6H PRN Cox, Amy N, DO       oxyCODONE  (Oxy IR/ROXICODONE ) immediate release tablet 5 mg  5 mg Oral Q6H PRN Cox, Amy N, DO       [START ON 01/07/2024] piperacillin-tazobactam (ZOSYN) IVPB 3.375 g  3.375 g Intravenous Q8H Cox, Amy N, DO       Current Outpatient Medications  Medication Sig  Dispense Refill   cephALEXin  (KEFLEX ) 500 MG capsule Take 1 capsule (500 mg total) by mouth 4 (four) times daily for 10 days. 40 capsule 0   Cyanocobalamin (VITAMIN B-12) 2500 MCG SUBL Place 2,500 mcg  under the tongue in the morning.     HYDROcodone -acetaminophen  (NORCO/VICODIN) 5-325 MG tablet TAKE 1 TABLET BY MOUTH EVERY 4 HOURS AS NEEDED FOR UP TO 7 DAYS FOR MODERATE PAIN (PAIN SCORE 4-6)     insulin  lispro (ADMELOG SOLOSTAR) 100 UNIT/ML KwikPen Inject 0-6 Units into the skin with breakfast, with lunch, and with evening meal. Sliding scale     Insulin  Pen Needle (BD PEN NEEDLE NANO ULTRAFINE) 32G X 4 MM MISC 1 each by Other route.  Use 1 each 4 (four) times daily     LANTUS  SOLOSTAR 100 UNIT/ML Solostar Pen INJECT 20 UNITS SUBCUTANEOUSLY AT BEDTIME     levothyroxine  (SYNTHROID ) 112 MCG tablet Take 112 mcg by mouth daily before breakfast.     sulfamethoxazole -trimethoprim  (BACTRIM  DS) 800-160 MG tablet Take 1 tablet by mouth 2 (two) times daily for 10 days. 20 tablet 0   CREON 24000-76000 units CPEP Take 2 capsules by mouth 3 (three) times daily before meals. (Patient not taking: Reported on 11/01/2023)     gabapentin  (NEURONTIN ) 300 MG capsule Take 1 capsule (300 mg total) by mouth at bedtime. Please take once at night for one week. May increase to twice a day if tolerating a day (Patient not taking: Reported on 11/01/2023) 90 capsule 2   TRESIBA FLEXTOUCH 100 UNIT/ML FlexTouch Pen Inject 20 Units into the skin every morning. (Patient not taking: Reported on 01/06/2024)      Allergies  Allergen Reactions   Armoracia Rusticana Ext (Horseradish) Itching, Nausea And Vomiting, Nausea Only and Swelling   Tree Extract Itching, Anaphylaxis and Shortness Of Breath    Walnuts-Throat closes up   [facility] Anaphylaxis, Shortness Of Breath, Itching, Nausea And Vomiting, Nausea Only and Swelling   Tomato Nausea And Vomiting, Nausea Only and Swelling    REVIEW OF SYSTEMS (negative unless checked):   Cardiac:   []  Chest pain or chest pressure? []  Shortness of breath upon activity? []  Shortness of breath when lying flat? []  Irregular heart rhythm?  Vascular:  []  Pain in calf, thigh, or hip brought on by walking? []  Pain in feet at night that wakes you up from your sleep? []  Blood clot in your veins? []  Leg swelling?  Pulmonary:  []  Oxygen at home? []  Productive cough? []  Wheezing?  Neurologic:  []  Sudden weakness in arms or legs? []  Sudden numbness in arms or legs? []  Sudden onset of difficult speaking or slurred speech? []  Temporary loss of vision in one eye? []  Problems with dizziness?  Gastrointestinal:  []  Blood in stool? []  Vomited blood?  Genitourinary:  []  Burning when urinating? []  Blood in urine?  Psychiatric:  []  Major depression  Hematologic:  []  Bleeding problems? []  Problems with blood clotting?  Dermatologic:  []  Rashes or ulcers?  Constitutional:  []  Fever or chills?  Ear/Nose/Throat:  []  Change in hearing? []  Nose bleeds? []  Sore throat?  Musculoskeletal:  []  Back pain? [x]  Joint pain? []  Muscle pain?   Physical Examination     Vitals:   01/06/24 1254 01/06/24 1301 01/06/24 1650 01/06/24 1801  BP: 114/86  124/84   Pulse: 98  (!) 58   Resp: 19  16   Temp: 97.7 F (36.5 C)   97.8 F (36.6 C)  TempSrc: Oral   Oral  SpO2: 95%  98%   Weight:  83.2 kg     Body mass index is 23.87 kg/m.  General Alert, O x 3, WD, NAD  Pulmonary Sym exp, good B air movt, CTA  B  Cardiac RRR, Nl S1, S2, no Murmurs, No rubs, No S3,S4  Vascular Vessel Right Left  Radial Palpable Palpable  Brachial Palpable Palpable  Carotid Palpable, No Bruit Palpable, No Bruit  Aorta Not palpable N/A  Femoral Palpable Palpable  Popliteal Not palpable Not palpable  PT Not palpable Faintly palpable  DP Faintly palpable Palpable    Musculo- skeletal BUE viable with M/S 5/5, RLE viable with M/S 5/5, testing LLE limited due to hyperesthesia to touch, blister in medial  incision appears collapsed  Dermatologic Erythematous rash span the medial width of the ankle, TTP without ascending cellulitis    Laboratory   CBC    Latest Ref Rng & Units 01/06/2024    1:07 PM 01/05/2024    4:27 AM 01/03/2024    4:05 PM  CBC  WBC 4.0 - 10.5 K/uL 8.2  5.4  6.9   Hemoglobin 13.0 - 17.0 g/dL 84.1  86.3  85.3   Hematocrit 39.0 - 52.0 % 47.4  40.8  44.5   Platelets 150 - 400 K/uL 235  185  200     BMP    Latest Ref Rng & Units 01/06/2024    1:07 PM 01/05/2024    4:27 AM 01/03/2024    4:05 PM  BMP  Glucose 70 - 99 mg/dL 680  785  80   BUN 8 - 23 mg/dL 38  24  30   Creatinine 0.61 - 1.24 mg/dL 8.16  8.42  8.38   Sodium 135 - 145 mmol/L 135  135  137   Potassium 3.5 - 5.1 mmol/L 4.8  4.9  4.3   Chloride 98 - 111 mmol/L 101  102  104   CO2 22 - 32 mmol/L 20  24  24    Calcium 8.9 - 10.3 mg/dL 9.5  8.8  9.2      Medical Decision Making   Jared Hess is a 73 y.o. 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

## 2024-01-06 NOTE — Assessment & Plan Note (Signed)
 On right sided flank Per patient/spouse, rash is on gluteal area, and bilateral inguinal area and started after leaving the hospital Patient is protecting airway and no mucosal involvement Diphenhydramine 25 mg IV every 6 hours as needed for itching, allergy, rash, 2 doses ordered

## 2024-01-06 NOTE — ED Provider Notes (Signed)
 [facility] Provider Note    Event Date/Time   First MD Initiated Contact with Patient 01/06/24 1649     (approximate)   History   Chief Complaint Wound Check   HPI  Jared Hess is a 73 y.o. male with past medical history of diabetes and CKD who presents to the ED complaining of wound check.  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  He states that the wound will occasionally drain fluid, which has been going on for many years.  However, this week he has had increasing drainage with pain and swelling to the ankle.  He was initially seen for this 5 days ago, started on Keflex  and Bactrim  at that time.  He did not have any improvement and return to the hospital 3 days later, at which point he was admitted for IV antibiotics.  Symptoms seem to improve on IV antibiotics and patient returned home yesterday, again prescribed Keflex  and Bactrim .  Since then, he has had redness spreading from the ankle up his leg with significant pain along the base of his foot.  He denies any fevers and has not noticed any purulent drainage from the wound.  He did have MRI while in the hospital 2 days ago that was consistent with cellulitis, no evidence of osteomyelitis or septic arthritis at that time.     Physical Exam   Triage Vital Signs: ED Triage Vitals  Encounter Vitals Group     BP 01/06/24 1254 114/86     Girls Systolic BP Percentile --      Girls Diastolic BP Percentile --      Boys Systolic BP Percentile --      Boys Diastolic BP Percentile --      Pulse Rate 01/06/24 1254 98     Resp 01/06/24 1254 19     Temp 01/06/24 1254 97.7 F (36.5 C)     Temp Source 01/06/24 1254 Oral     SpO2 01/06/24 1254 95 %     Weight 01/06/24 1301 183 lb 6.8 oz (83.2 kg)     Height --      Head Circumference --      Peak Flow --      Pain Score 01/06/24 1259 6      Pain Loc --      Pain Education --      Exclude from Growth Chart --     Most recent vital signs: Vitals:   01/06/24 1650 01/06/24 1801  BP: 124/84   Pulse: (!) 58   Resp: 16   Temp:  97.8 F (36.6 C)  SpO2: 98%     Constitutional: Alert and oriented. Eyes: Conjunctivae are normal. Head: Atraumatic. Nose: No congestion/rhinnorhea. Mouth/Throat: Mucous membranes are moist.  Cardiovascular: Normal rate, regular rhythm. Grossly normal heart sounds.  2+ radial and DP pulses bilaterally.  Cap refill less than 2 seconds throughout toes of left foot. Respiratory: Normal respiratory effort.  No retractions. Lungs CTAB. Gastrointestinal: Soft and nontender. No distention. Musculoskeletal: Erythema and tenderness surrounding chronic wound to the medial malleolus of left ankle.  Patient is exquisitely tender to palpation to this area as well as area of lateral malleolus and the plantar surface of his foot.  No fluctuance or purulent drainage noted. Neurologic:  Normal speech and language. No gross focal neurologic deficits are appreciated.  ED Results / Procedures / Treatments   Labs (all labs ordered are listed, but only abnormal results are displayed) Labs Reviewed  COMPREHENSIVE METABOLIC PANEL WITH GFR - Abnormal; Notable for the following components:      Result Value   CO2 20 (*)    Glucose, Bld 319 (*)    BUN 38 (*)    Creatinine, Ser 1.83 (*)    GFR, Estimated 39 (*)    All other components within normal limits  LACTIC ACID, PLASMA  CBC WITH DIFFERENTIAL/PLATELET  URINALYSIS, W/ REFLEX TO CULTURE (INFECTION SUSPECTED)    PROCEDURES:  Critical Care performed: No  Procedures   MEDICATIONS ORDERED IN ED: Medications  vancomycin  (VANCOCIN ) IVPB 1000 mg/200 mL premix (has no administration in time range)  morphine  (PF) 4 MG/ML injection 4 mg (4 mg Intravenous Given 01/06/24 1756)  piperacillin-tazobactam (ZOSYN) IVPB 3.375 g (3.375 g Intravenous New Bag/Given  01/06/24 1800)     IMPRESSION / MDM / ASSESSMENT AND PLAN / ED COURSE  I reviewed the triage vital signs and the nursing notes.                              73 y.o. male with past medical history of diabetes and CKD who presents to the ED complaining of worsening redness and pain around wound to his left ankle.  Patient's presentation is most consistent with acute presentation with potential threat to life or bodily function.  Differential diagnosis includes, but is not limited to, septic arthritis, osteomyelitis, abscess, cellulitis, sepsis.  Patient nontoxic-appearing and in no acute distress, vital signs are unremarkable.  He is neurovascularly intact distally with strong DP pulse and cap refill less than 2 seconds.  He does have signs of worsening infection, recent MRI reviewed and showed no signs of osteomyelitis or septic arthritis.  We will restart broad-spectrum IV antibiotics but patient does not meet sepsis criteria.  Labs without significant anemia, leukocytosis, or electrolyte abnormality.  Patient is hyperglycemic with no evidence of DKA, also with mild AKI.  Case discussed with Dr. Lennie of podiatry, who evaluated the patient and agrees with plan for admission for IV antibiotics, low suspicion for septic arthritis at this time.  He does recommend vascular surgery consult and case discussed with hospitalist for admission.      FINAL CLINICAL IMPRESSION(S) / ED DIAGNOSES   Final diagnoses:  Wound infection  Cellulitis of left lower extremity     Rx / DC Orders   ED Discharge Orders     None        Note:  This document was prepared using Dragon voice recognition software and may include unintentional dictation errors.   Willo Dunnings, MD 01/06/24 [phone]

## 2024-01-06 NOTE — Assessment & Plan Note (Addendum)
 Home long-acting insulin , 20 units every morning resumed Insulin  SSI with at bedtime coverage ordered

## 2024-01-06 NOTE — H&P (Signed)
 History and Physical   Shahzad Thomann FMW:[id number] DOB: 04-08-1951 DOA: 01/06/2024  PCP: Lenon Layman ORN, MD  Outpatient Specialists: Dr. Luke Arlyss Louder, [facility] Patient coming from: Home  I have personally briefly reviewed patient's old medical records in [facility] EMR.  Chief Concern: Left lower extremity redness and pain  HPI: [name] is a 73 year old male with history of insulin -dependent diabetes mellitus type 1, hypothyroid, who presents ED for chief concerns of left lower extremity redness and pain.  He reports his symptoms and the rash has gotten worse after leaving the hospital.  He endorses compliance with home p.o. antibiotics.  Vitals in the ED showed T of 97.8, respiration rate 19, heart rate 58, blood pressure 124/84, SpO2 98% on room air.  Serum sodium is 135, potassium 4.8, chloride 101, bicarb 20, BUN of 38, serum creatinine 1.83, GFR 39, nonfasting glucose 319,, WBC 8.2, hemoglobin 15.8, platelets of 235.  Lactic acid 1.3.  ED treatment: Zosyn, vancomycin , morphine  4 mg IV one-time dose ---------------------------------- At bedside, patient able to tell me his first and last name, age, location, current calendar year.  He reports the swelling and redness have gotten worse and the pain has gotten worse since he has been discharged from the hospital yesterday.  He endorses compliance with his oral antibiotics.  He reports he has been having intermittent fever and chills.  They did not take a temperature at home.  He denies nausea, vomiting, dysuria, hematuria, diarrhea, blood in his stool, syncope.  Social history: He lives at home with his wife.  He denies tobacco, EtOH, recreational drug use.  He is retired and previously was a Occupational hygienist for [employer].  ROS: Constitutional: no weight change, no fever ENT/Mouth: no sore throat, no rhinorrhea Eyes: no eye pain, no vision changes Cardiovascular: no chest pain, no dyspnea,  no  edema, no palpitations Respiratory: no cough, no sputum, no wheezing Gastrointestinal: no nausea, no vomiting, no diarrhea, no constipation Genitourinary: no urinary incontinence, no dysuria, no hematuria Musculoskeletal: no arthralgias, no myalgias Skin: no skin lesions, no pruritus, + left lower extremity redness Neuro: + weakness, no loss of consciousness, no syncope Psych: no anxiety, no depression, + decrease appetite Heme/Lymph: no bruising, no bleeding  ED Course: Discussed with EDP, patient requiring hospitalization for chief concerns of cellulitis, failing outpatient therapy  Assessment/Plan  Principal Problem:   Cellulitis Active Problems:   Wound infection   Neuropathy   Dementia (HCC)   Type 1 diabetes mellitus (HCC)   CKD stage 3a, GFR 45-59 ml/min (HCC)   AKI (acute kidney injury) (HCC)   Rash   Hypothyroidism   Assessment and Plan:  * Cellulitis Failing outpatient therapy: Keflex  and Bactrim  Will continue with Zosyn and vancomycin  per pharmacy Podiatry has been consulted and recommends an ABI ABI has been ordered, a.m. team to consult vascular pending ABI Telemetry medical, inpatient  Hypothyroidism Home levothyroxine  112 mcg daily before breakfast  Rash On right sided flank Per patient/spouse, rash is on gluteal area, and bilateral inguinal area and started after leaving the hospital Patient is protecting airway and no mucosal involvement Diphenhydramine 25 mg IV every 6 hours as needed for itching, allergy, rash, 2 doses ordered  AKI (acute kidney injury) (HCC) On CKD 3 A Sodium chloride  125 mL/h, 1 day ordered Recheck BMP in a.m.  Type 1 diabetes mellitus (HCC) Home long-acting insulin , 20 units every morning resumed Insulin  SSI with at bedtime coverage ordered  Chart reviewed.   DVT  prophylaxis: Heparin Code Status: Full code Diet: Heart healthy/carb modified Family Communication: Updated spouse at bedside Disposition Plan: Pending  clinical course Consults called: Podiatry, pharmacy Admission status: Telemetry medical, inpt  Past Medical History:  Diagnosis Date   Bilateral carpal tunnel syndrome    Chronic bilateral low back pain without sciatica    CKD (chronic kidney disease) stage 3, GFR 30-59 ml/min (HCC)    Dementia (HCC)    DM (diabetes mellitus), type 1 (HCC)    ED (erectile dysfunction)    Hypothyroidism    Neuropathy    Prostate cancer (HCC)    Thyroid disease    Past Surgical History:  Procedure Laterality Date   BICEPT TENODESIS Right 07/14/2021   Procedure: BICEPS TENODESIS;  Surgeon: Marchia Drivers, MD;  Location: [facility] ORS;  Service: Orthopedics;  Laterality: Right;   CARPAL TUNNEL RELEASE Right 07/12/2023   Procedure: RIGHT CARPAL TUNNEL RELEASE WITH ULTRASOUND GUIDANCE;  Surgeon: Claudene Penne ORN, MD;  Location: [facility] ORS;  Service: Neurosurgery;  Laterality: Right;   CARPAL TUNNEL RELEASE Left 08/09/2023   Procedure: LEFT CARPAL TUNNEL RELEASE WITH ULTRASOUND GUIDANCE;  Surgeon: Claudene Penne ORN, MD;  Location: [facility] ORS;  Service: Neurosurgery;  Laterality: Left;   FOOT SURGERY Left    LUMBAR FUSION     PROSTATE CRYOABLATION  2010   SHOULDER ARTHROSCOPY WITH OPEN ROTATOR CUFF REPAIR AND DISTAL CLAVICLE ACROMINECTOMY Right 07/14/2021   Procedure: SHOULDER ARTHROSCOPY WITH OPEN ROTATOR CUFF REPAIR AND DISTAL CLAVICLE ACROMINECTOMY;  Surgeon: Marchia Drivers, MD;  Location: [facility] ORS;  Service: Orthopedics;  Laterality: Right;   shoulder blade surgery  2002   Social History:  reports that he has never smoked. He has never used smokeless tobacco. He reports that he does not currently use alcohol. He reports that he does not use drugs.  Allergies  Allergen Reactions   Armoracia Rusticana Ext (Horseradish) Itching, Nausea And Vomiting, Nausea Only and Swelling   Tree Extract Itching, Anaphylaxis and Shortness Of Breath    Walnuts-Throat closes up   [facility] Anaphylaxis, Shortness Of Breath,  Itching, Nausea And Vomiting, Nausea Only and Swelling   Tomato Nausea And Vomiting, Nausea Only and Swelling   Family History  Problem Relation Age of Onset   Diabetes Sister    Prostate cancer Neg Hx    Bladder Cancer Neg Hx    Kidney cancer Neg Hx    Family history: Family history reviewed and not pertinent.  Prior to Admission medications   Medication Sig Start Date End Date Taking? Authorizing Provider  cephALEXin  (KEFLEX ) 500 MG capsule Take 1 capsule (500 mg total) by mouth 4 (four) times daily for 10 days. 01/05/24 01/15/24  Arlon Carliss ORN, DO  CREON 24000-76000 units CPEP Take 2 capsules by mouth 3 (three) times daily before meals. Patient not taking: Reported on 11/01/2023    [provider]  Cyanocobalamin (VITAMIN B-12) 2500 MCG SUBL Place 2,500 mcg under the tongue in the morning.    [provider]  gabapentin  (NEURONTIN ) 300 MG capsule Take 1 capsule (300 mg total) by mouth at bedtime. Please take once at night for one week. May increase to twice a day if tolerating a day Patient not taking: Reported on 11/01/2023 08/24/23   Ulis Bottcher, PA-C  insulin  lispro (ADMELOG SOLOSTAR) 100 UNIT/ML KwikPen Inject 0-6 Units into the skin with breakfast, with lunch, and with evening meal. Sliding scale    [provider]  levothyroxine  (SYNTHROID ) 112 MCG tablet Take 112 mcg by mouth daily  before breakfast. 01/30/19   [provider]  sulfamethoxazole -trimethoprim  (BACTRIM  DS) 800-160 MG tablet Take 1 tablet by mouth 2 (two) times daily for 10 days. 01/05/24 01/15/24  Arlon Carliss ORN, DO  TRESIBA FLEXTOUCH 100 UNIT/ML FlexTouch Pen Inject 20 Units into the skin every morning. 04/26/21   [provider]   Physical Exam: Vitals:   01/06/24 1254 01/06/24 1301 01/06/24 1650 01/06/24 1801  BP: 114/86  124/84   Pulse: 98  (!) 58   Resp: 19  16   Temp: 97.7 F (36.5 C)   97.8 F (36.6 C)  TempSrc: Oral   Oral  SpO2: 95%  98%   Weight:  83.2 kg      Constitutional: appears age appropriate, NAD, calm Eyes: PERRL, lids and conjunctivae normal ENMT: Mucous membranes are moist. Posterior pharynx clear of any exudate or lesions. Age-appropriate dentition. Hearing appropriate Neck: normal, supple, no masses, no thyromegaly Respiratory: clear to auscultation bilaterally, no wheezing, no crackles. Normal respiratory effort. No accessory muscle use.  Cardiovascular: Regular rate and rhythm, no murmurs / rubs / gallops. No extremity edema. 2+ pedal pulses. No carotid bruits.  Abdomen: no tenderness, no masses palpated, no hepatosplenomegaly. Bowel sounds positive.  Musculoskeletal: no clubbing / cyanosis. No joint deformity upper and lower extremities. Good ROM, no contractures, no atrophy. Normal muscle tone.  Skin: no rashes, lesions, ulcers. No induration Neurologic: Sensation intact. Strength 5/5 in all 4.  Psychiatric: Normal judgment and insight. Alert and oriented x 3. Normal mood.   EKG: Not indicated at this time Chest x-ray on Admission: Not indicated at this Labs on Admission: I have personally reviewed following labs  CBC: Recent Labs  Lab 01/01/24 1341 01/03/24 1605 01/05/24 0427 01/06/24 1307  WBC 7.2 6.9 5.4 8.2  NEUTROABS 4.2 4.0  --  5.0  HGB 14.1 14.6 13.6 15.8  HCT 42.2 44.5 40.8 47.4  MCV 87.2 89.0 86.8 86.8  PLT 209 200 185 235   Basic Metabolic Panel: Recent Labs  Lab 01/01/24 1341 01/03/24 1605 01/05/24 0427 01/06/24 1307  NA 136 137 135 135  K 4.5 4.3 4.9 4.8  CL 104 104 102 101  CO2 25 24 24  20*  GLUCOSE 146* 80 214* 319*  BUN 23 30* 24* 38*  CREATININE 1.25* 1.61* 1.57* 1.83*  CALCIUM 9.0 9.2 8.8* 9.5  MG  --   --  1.8  --    GFR: Estimated Creatinine Clearance: 42.5 mL/min (A) (by C-G formula based on SCr of 1.83 mg/dL (H)).  Liver Function Tests: Recent Labs  Lab 01/01/24 1341 01/03/24 1605 01/06/24 1307  AST 28 23 18   ALT 21 20 16   ALKPHOS 82 85 92  BILITOT 1.1 0.8 0.9  PROT  6.6 7.4 6.8  ALBUMIN 3.9 4.5 4.0   HbA1C: Recent Labs    01/04/24 0121  HGBA1C 7.6*   CBG: Recent Labs  Lab 01/04/24 1638 01/04/24 1945 01/04/24 2337 01/05/24 0732 01/05/24 1414  GLUCAP 256* 308* 46* 274* 491*   Urine analysis:    Component Value Date/Time   COLORURINE YELLOW (A) 01/01/2024 1341   APPEARANCEUR CLEAR (A) 01/01/2024 1341   LABSPEC 1.023 01/01/2024 1341   PHURINE 6.0 01/01/2024 1341   GLUCOSEU NEGATIVE 01/01/2024 1341   HGBUR NEGATIVE 01/01/2024 1341   BILIRUBINUR NEGATIVE 01/01/2024 1341   KETONESUR NEGATIVE 01/01/2024 1341   PROTEINUR NEGATIVE 01/01/2024 1341   NITRITE NEGATIVE 01/01/2024 1341   LEUKOCYTESUR NEGATIVE 01/01/2024 1341   This document was prepared using Dragon  Voice Recognition software and may include unintentional dictation errors.  Dr. Sherre [facility]  If 7PM-7AM, please contact overnight-coverage provider If 7AM-7PM, please contact day attending provider [email or web address]  01/06/2024, 7:08 PM

## 2024-01-06 NOTE — Assessment & Plan Note (Signed)
 Home levothyroxine  112 mcg daily before breakfast

## 2024-01-06 NOTE — Assessment & Plan Note (Signed)
 On CKD 3 A Sodium chloride  125 mL/h, 1 day ordered Recheck BMP in a.m.

## 2024-01-07 ENCOUNTER — Inpatient Hospital Stay

## 2024-01-07 DIAGNOSIS — L03116 Cellulitis of left lower limb: Secondary | ICD-10-CM | POA: Diagnosis not present

## 2024-01-07 LAB — CBC
HCT: 42.7 % (ref 39.0–52.0)
Hemoglobin: 14.2 g/dL (ref 13.0–17.0)
MCH: 29 pg (ref 26.0–34.0)
MCHC: 33.3 g/dL (ref 30.0–36.0)
MCV: 87.1 fL (ref 80.0–100.0)
Platelets: 182 K/uL (ref 150–400)
RBC: 4.9 MIL/uL (ref 4.22–5.81)
RDW: 13.2 % (ref 11.5–15.5)
WBC: 5.7 K/uL (ref 4.0–10.5)
nRBC: 0 % (ref 0.0–0.2)

## 2024-01-07 LAB — BASIC METABOLIC PANEL WITH GFR
Anion gap: 7 (ref 5–15)
BUN: 33 mg/dL — ABNORMAL HIGH (ref 8–23)
CO2: 23 mmol/L (ref 22–32)
Calcium: 8.6 mg/dL — ABNORMAL LOW (ref 8.9–10.3)
Chloride: 107 mmol/L (ref 98–111)
Creatinine, Ser: 1.51 mg/dL — ABNORMAL HIGH (ref 0.61–1.24)
GFR, Estimated: 49 mL/min — ABNORMAL LOW (ref >60)
Glucose, Bld: 137 mg/dL — ABNORMAL HIGH (ref 70–99)
Potassium: 4.5 mmol/L (ref 3.5–5.1)
Sodium: 137 mmol/L (ref 135–145)

## 2024-01-07 LAB — GLUCOSE, CAPILLARY
Glucose-Capillary: 132 mg/dL — ABNORMAL HIGH (ref 70–99)
Glucose-Capillary: 271 mg/dL — ABNORMAL HIGH (ref 70–99)
Glucose-Capillary: 116 mg/dL — ABNORMAL HIGH (ref 70–99)
Glucose-Capillary: 158 mg/dL — ABNORMAL HIGH (ref 70–99)

## 2024-01-07 MED ORDER — INSULIN ASPART 100 UNIT/ML IJ SOLN
0.0000 [IU] | Freq: Three times a day (TID) | INTRAMUSCULAR | Status: DC
  Administered 2024-01-07: 11 [IU] via SUBCUTANEOUS
  Administered 2024-01-08: 3 [IU] via SUBCUTANEOUS
  Administered 2024-01-08: 20 [IU] via SUBCUTANEOUS
  Administered 2024-01-09: 4 [IU] via SUBCUTANEOUS
  Administered 2024-01-09: 7 [IU] via SUBCUTANEOUS
  Administered 2024-01-09: 11 [IU] via SUBCUTANEOUS
  Administered 2024-01-10: 15 [IU] via SUBCUTANEOUS
  Administered 2024-01-10: 7 [IU] via SUBCUTANEOUS
  Filled 2024-01-07 (×8): qty 1

## 2024-01-07 NOTE — Plan of Care (Signed)
  Problem: Coping: Goal: Ability to adjust to condition or change in health will improve Outcome: Progressing   Problem: Nutritional: Goal: Maintenance of adequate nutrition will improve Outcome: Progressing   Problem: Clinical Measurements: Goal: Diagnostic test results will improve Outcome: Progressing   Problem: Coping: Goal: Level of anxiety will decrease Outcome: Progressing

## 2024-01-07 NOTE — Progress Notes (Addendum)
 PROGRESS NOTE    Jared Hess  FMW:[id number] DOB: [birth year] DOA: 01/06/2024 PCP: Lenon Layman ORN, MD   Assessment & Plan:   Principal Problem:   Cellulitis Active Problems:   Wound infection   Neuropathy   Dementia (HCC)   Type 1 diabetes mellitus (HCC)   CKD stage 3a, GFR 45-59 ml/min (HCC)   AKI (acute kidney injury) (HCC)   Rash   Hypothyroidism  Assessment and Plan: Left ankle cellulitis: w/ chronic wound x 3-4 months. Continue on IV zosyn , linezolid  as per ID. ABI ordered. ID and vasc surg following and recs apprec   Hypothyroidism: continue on levothyroxine    Rash: right flank, gluteal area & inguinal area. Benadryl  prn    AKI on CKDIIIa: Cr is labile. Avoid nephrotoxic meds    DM1: HbA1c 7.6, poorly controlled. Continue on glargine, SSI w/ accuchecks      DVT prophylaxis: heparin   Code Status: full  Family Communication:  Disposition Plan: likely d/c back home   Level of care: Telemetry Medical  Status is: Inpatient Remains inpatient appropriate because: severity of illness, requiring IV abxs    Consultants:  ID Vasc surg  Procedures:   Antimicrobials: zosyn , linezolid    Subjective: Pt c/o malaise   Objective: Vitals:   01/06/24 1801 01/06/24 2224 01/07/24 0305 01/07/24 0828  BP:  137/86 119/71 114/77  Pulse:  (!) 59 (!) 55 60  Resp:  16 16   Temp: 97.8 F (36.6 C) (!) 97.4 F (36.3 C) 97.8 F (36.6 C) 97.6 F (36.4 C)  TempSrc: Oral   Oral  SpO2:  97% 100% 98%  Weight:        Intake/Output Summary (Last 24 hours) at 01/07/2024 0903 Last data filed at 01/07/2024 0302 Gross per 24 hour  Intake 961.14 ml  Output --  Net 961.14 ml   Filed Weights   01/06/24 1301  Weight: 83.2 kg    Examination:  General exam: Appears calm and comfortable  Respiratory system: Clear to auscultation. Respiratory effort normal. Cardiovascular system: S1 & S2+. No rubs, gallops or clicks.  Gastrointestinal system: Abdomen is  nondistended, soft and nontender. Normal bowel sounds heard. Central nervous system: Alert and oriented. Moves all extremities  Psychiatry: Judgement and insight appear normal. Mood & affect appropriate.     Data Reviewed: I have personally reviewed following labs and imaging studies  CBC: Recent Labs  Lab 01/01/24 1341 01/03/24 1605 01/05/24 0427 01/06/24 1307 01/07/24 0609  WBC 7.2 6.9 5.4 8.2 5.7  NEUTROABS 4.2 4.0  --  5.0  --   HGB 14.1 14.6 13.6 15.8 14.2  HCT 42.2 44.5 40.8 47.4 42.7  MCV 87.2 89.0 86.8 86.8 87.1  PLT 209 200 185 235 182   Basic Metabolic Panel: Recent Labs  Lab 01/01/24 1341 01/03/24 1605 01/05/24 0427 01/06/24 1307 01/07/24 0609  NA 136 137 135 135 137  K 4.5 4.3 4.9 4.8 4.5  CL 104 104 102 101 107  CO2 25 24 24  20* 23  GLUCOSE 146* 80 214* 319* 137*  BUN 23 30* 24* 38* 33*  CREATININE 1.25* 1.61* 1.57* 1.83* 1.51*  CALCIUM 9.0 9.2 8.8* 9.5 8.6*  MG  --   --  1.8  --   --    GFR: Estimated Creatinine Clearance: 51.5 mL/min (A) (by C-G formula based on SCr of 1.51 mg/dL (H)). Liver Function Tests: Recent Labs  Lab 01/01/24 1341 01/03/24 1605 01/06/24 1307  AST 28 23 18   ALT 21 20 16  ALKPHOS 82 85 92  BILITOT 1.1 0.8 0.9  PROT 6.6 7.4 6.8  ALBUMIN 3.9 4.5 4.0   No results for input(s): LIPASE, AMYLASE in the last 168 hours. No results for input(s): AMMONIA in the last 168 hours. Coagulation Profile: No results for input(s): INR, PROTIME in the last 168 hours. Cardiac Enzymes: No results for input(s): CKTOTAL, CKMB, CKMBINDEX, TROPONINI in the last 168 hours. BNP (last 3 results) No results for input(s): PROBNP in the last 8760 hours. HbA1C: No results for input(s): HGBA1C in the last 72 hours. CBG: Recent Labs  Lab 01/04/24 2337 01/05/24 0732 01/05/24 1414 01/06/24 2229 01/07/24 0826  GLUCAP 46* 274* 491* 142* 132*   Lipid Profile: No results for input(s): CHOL, HDL, LDLCALC, TRIG,  CHOLHDL, LDLDIRECT in the last 72 hours. Thyroid Function Tests: No results for input(s): TSH, T4TOTAL, FREET4, T3FREE, THYROIDAB in the last 72 hours. Anemia Panel: No results for input(s): VITAMINB12, FOLATE, FERRITIN, TIBC, IRON, RETICCTPCT in the last 72 hours. Sepsis Labs: Recent Labs  Lab 01/03/24 1605 01/06/24 1307  LATICACIDVEN 0.8 1.3    Recent Results (from the past 240 hours)  Blood culture (routine x 2)     Status: None (Preliminary result)   Collection Time: 01/04/24  1:21 AM   Specimen: BLOOD  Result Value Ref Range Status   Specimen Description BLOOD LEFT ANTECUBITAL  Final   Special Requests   Final    BOTTLES DRAWN AEROBIC AND ANAEROBIC Blood Culture results may not be optimal due to an excessive volume of blood received in culture bottles   Culture   Final    NO GROWTH 3 DAYS Performed at [facility], [address], [place], [place] [address]    Report Status PENDING  Incomplete  Blood culture (routine x 2)     Status: None (Preliminary result)   Collection Time: 01/04/24  1:21 AM   Specimen: BLOOD  Result Value Ref Range Status   Specimen Description BLOOD BLOOD LEFT HAND  Final   Special Requests   Final    BOTTLES DRAWN AEROBIC AND ANAEROBIC Blood Culture results may not be optimal due to an excessive volume of blood received in culture bottles   Culture   Final    NO GROWTH 3 DAYS Performed at [facility], [address] [place], [place] [address]    Report Status PENDING  Incomplete  Aerobic/Anaerobic Culture w Gram Stain (surgical/deep wound)     Status: None (Preliminary result)   Collection Time: 01/06/24  7:04 PM   Specimen: Wound  Result Value Ref Range Status   Specimen Description   Final    WOUND Performed at [facility], [address], [place], [place] [address]    Special Requests   Final    LL Performed at [facility], [address] [place], [place] [address]     Gram Stain   Final    NO WBC SEEN NO ORGANISMS SEEN Performed at [facility], [address], [place], [place] [address]    Culture PENDING  Incomplete   Report Status PENDING  Incomplete         Radiology Studies: No results found.      Scheduled Meds:  heparin   5,000 Units Subcutaneous Q8H   insulin  aspart  0-15 Units Subcutaneous TID WC   insulin  aspart  0-5 Units Subcutaneous QHS   insulin  glargine-yfgn  16 Units Subcutaneous BH-q7a   levothyroxine   112 mcg Oral QAC breakfast  Continuous Infusions:  sodium chloride  125 mL/hr at 01/07/24 9687   linezolid  (ZYVOX ) IV     piperacillin -tazobactam (ZOSYN )  IV 3.375 g (01/07/24 0627)     LOS: 1 day       Anthony CHRISTELLA Pouch, MD [facility] Pager [phone]  If 7PM-7AM, please contact night-coverage [email or web address] 01/07/2024, 9:03 AM

## 2024-01-07 NOTE — Progress Notes (Addendum)
      Daily Progress Note   Assessment/Planning:   LLE ankle wound  Awaiting PVR Further recommendations pending such   Subjective  - * No surgery found *   No changes overnight   Objective   Vitals:   01/06/24 1801 01/06/24 2224 01/07/24 0305 01/07/24 0828  BP:  137/86 119/71 114/77  Pulse:  (!) 59 (!) 55 60  Resp:  16 16   Temp: 97.8 F (36.6 C) (!) 97.4 F (36.3 C) 97.8 F (36.6 C) 97.6 F (36.4 C)  TempSrc: Oral   Oral  SpO2:  97% 100% 98%  Weight:         Intake/Output Summary (Last 24 hours) at 01/07/2024 0900 Last data filed at 01/07/2024 0302 Gross per 24 hour  Intake 961.14 ml  Output --  Net 961.14 ml    VASC L ankle and foot bandaged    Laboratory   CBC    Latest Ref Rng & Units 01/07/2024    6:09 AM 01/06/2024    1:07 PM 01/05/2024    4:27 AM  CBC  WBC 4.0 - 10.5 K/uL 5.7  8.2  5.4   Hemoglobin 13.0 - 17.0 g/dL 85.7  84.1  86.3   Hematocrit 39.0 - 52.0 % 42.7  47.4  40.8   Platelets 150 - 400 K/uL 182  235  185     BMET    Component Value Date/Time   NA 137 01/07/2024 0609   K 4.5 01/07/2024 0609   CL 107 01/07/2024 0609   CO2 23 01/07/2024 0609   GLUCOSE 137 (H) 01/07/2024 0609   BUN 33 (H) 01/07/2024 0609   CREATININE 1.51 (H) 01/07/2024 0609   CALCIUM 8.6 (L) 01/07/2024 0609   GFRNONAA 49 (L) 01/07/2024 0609   GFRAA >60 04/28/2019 1304     Redell Door, MD, FACS, FSVS Covering for [facility]: [phone]  01/07/2024, 9:00 AM   Addendum  I reviewed the ABI done on this pt.  No evidence of significant PAD.  This essentially rules out PAD as the etiology of his wounds.  Pt's exam is not consistent with venous disease.   Available as needed.  Redell Door, MD, FACS, FSVS Covering for [facility]: [phone]  01/07/2024, 5:19 PM

## 2024-01-08 DIAGNOSIS — S91002D Unspecified open wound, left ankle, subsequent encounter: Secondary | ICD-10-CM

## 2024-01-08 DIAGNOSIS — S91002S Unspecified open wound, left ankle, sequela: Secondary | ICD-10-CM | POA: Diagnosis not present

## 2024-01-08 DIAGNOSIS — L089 Local infection of the skin and subcutaneous tissue, unspecified: Secondary | ICD-10-CM

## 2024-01-08 DIAGNOSIS — L03116 Cellulitis of left lower limb: Secondary | ICD-10-CM | POA: Diagnosis not present

## 2024-01-08 LAB — BASIC METABOLIC PANEL WITH GFR
Anion gap: 6 (ref 5–15)
BUN: 28 mg/dL — ABNORMAL HIGH (ref 8–23)
CO2: 23 mmol/L (ref 22–32)
Calcium: 8.4 mg/dL — ABNORMAL LOW (ref 8.9–10.3)
Chloride: 101 mmol/L (ref 98–111)
Creatinine, Ser: 1.62 mg/dL — ABNORMAL HIGH (ref 0.61–1.24)
GFR, Estimated: 45 mL/min — ABNORMAL LOW (ref >60)
Glucose, Bld: 403 mg/dL — ABNORMAL HIGH (ref 70–99)
Potassium: 4.8 mmol/L (ref 3.5–5.1)
Sodium: 130 mmol/L — ABNORMAL LOW (ref 135–145)

## 2024-01-08 LAB — GLUCOSE, CAPILLARY
Glucose-Capillary: 351 mg/dL — ABNORMAL HIGH (ref 70–99)
Glucose-Capillary: 121 mg/dL — ABNORMAL HIGH (ref 70–99)
Glucose-Capillary: 95 mg/dL (ref 70–99)
Glucose-Capillary: 334 mg/dL — ABNORMAL HIGH (ref 70–99)

## 2024-01-08 NOTE — Progress Notes (Signed)
 PODIATRY / FOOT AND ANKLE SURGERY PROGRESS NOTE  Requesting Physician: Dr. Willo  Reason for consult: Left medial ankle wound/cellulitis   HPI: Jared Hess is a 73 y.o. male who presents in bed resting fairly comfortably.  He notes reduction in pain since last visit on Friday.  He rates his pain today at 3/10.  He notes that now he is able to move his toes a little bit which is a great improvement as he has not really been able to do much motion for the past 3 months or so.  Patient currently denies nausea, vomiting, fevers, chills.  PMHx:  Past Medical History:  Diagnosis Date   Bilateral carpal tunnel syndrome    Chronic bilateral low back pain without sciatica    CKD (chronic kidney disease) stage 3, GFR 30-59 ml/min (HCC)    Dementia (HCC)    DM (diabetes mellitus), type 1 (HCC)    ED (erectile dysfunction)    Hypothyroidism    Neuropathy    Prostate cancer (HCC)    Thyroid disease     Surgical Hx:  Past Surgical History:  Procedure Laterality Date   BICEPT TENODESIS Right 07/14/2021   Procedure: BICEPS TENODESIS;  Surgeon: Marchia Drivers, MD;  Location: [facility] OR[facility];  Service: Orthopedics;  Laterality: Right;   CARPAL TUNNEL RELEASE Right 07/12/2023   Procedure: RIGHT CARPAL TUNNEL RELEASE WITH ULTRASOUND GUIDANCE;  Surgeon: Claudene Penne ORN, MD;  Location: [facility] ORS;  Service: Neurosurgery;  Laterality: Right;   CARPAL TUNNEL RELEASE Left 08/09/2023   Procedure: LEFT CARPAL TUNNEL RELEASE WITH ULTRASOUND GUIDANCE;  Surgeon: Claudene Penne ORN, MD;  Location: [facility] ORS;  Service: Neurosurgery;  Laterality: Left;   FOOT SURGERY Left    LUMBAR FUSION     PROSTATE CRYOABLATION  2010   SHOULDER ARTHROSCOPY WITH OPEN ROTATOR CUFF REPAIR AND DISTAL CLAVICLE ACROMINECTOMY Right 07/14/2021   Procedure: SHOULDER ARTHROSCOPY WITH OPEN ROTATOR CUFF REPAIR AND DISTAL CLAVICLE ACROMINECTOMY;  Surgeon: Marchia Drivers, MD;  Location: [facility] OR[facility];  Service: Orthopedics;  Laterality: Right;    shoulder blade surgery  2002    FHx:  Family History  Problem Relation Age of Onset   Diabetes Sister    Prostate cancer Neg Hx    Bladder Cancer Neg Hx    Kidney cancer Neg Hx     Social History:  reports that he has never smoked. He has never used smokeless tobacco. He reports that he does not currently use alcohol. He reports that he does not use drugs.  Allergies:  Allergies  Allergen Reactions   Armoracia Rusticana Ext (Horseradish) Itching, Nausea And Vomiting, Nausea Only and Swelling   Tree Extract Itching, Anaphylaxis and Shortness Of Breath    Walnuts-Throat closes up   [facility] Anaphylaxis, Shortness Of Breath, Itching, Nausea And Vomiting, Nausea Only and Swelling    Medications Prior to Admission  Medication Sig Dispense Refill   cephALEXin  (KEFLEX ) 500 MG capsule Take 1 capsule (500 mg total) by mouth 4 (four) times daily for 10 days. 40 capsule 0   Cyanocobalamin (VITAMIN B-12) 2500 MCG SUBL Place 2,500 mcg under the tongue in the morning.     HYDROcodone -acetaminophen  (NORCO/VICODIN) 5-325 MG tablet TAKE 1 TABLET BY MOUTH EVERY 4 HOURS AS NEEDED FOR UP TO 7 DAYS FOR MODERATE PAIN (PAIN SCORE 4-6)     insulin  lispro (ADMELOG SOLOSTAR) 100 UNIT/ML KwikPen Inject 0-6 Units into the skin with breakfast, with lunch, and with evening meal. Sliding scale     Insulin  Pen Needle (  BD PEN NEEDLE NANO ULTRAFINE) 32G X 4 MM MISC 1 each by Other route.  Use 1 each 4 (four) times daily     LANTUS  SOLOSTAR 100 UNIT/ML Solostar Pen INJECT 20 UNITS SUBCUTANEOUSLY AT BEDTIME     levothyroxine  (SYNTHROID ) 112 MCG tablet Take 112 mcg by mouth daily before breakfast.     sulfamethoxazole -trimethoprim  (BACTRIM  DS) 800-160 MG tablet Take 1 tablet by mouth 2 (two) times daily for 10 days. 20 tablet 0   CREON 24000-76000 units CPEP Take 2 capsules by mouth 3 (three) times daily before meals. (Patient not taking: Reported on 11/01/2023)     gabapentin  (NEURONTIN ) 300 MG capsule Take 1 capsule  (300 mg total) by mouth at bedtime. Please take once at night for one week. May increase to twice a day if tolerating a day (Patient not taking: Reported on 11/01/2023) 90 capsule 2   TRESIBA FLEXTOUCH 100 UNIT/ML FlexTouch Pen Inject 20 Units into the skin every morning. (Patient not taking: Reported on 01/06/2024)      Physical Exam: General: Alert and oriented.  No apparent distress.  Vascular: DP pulse right palpable, left nonpalpable; PT pulse right nonpalpable, left nonpalpable, capillary fill time appears to be intact to digits bilaterally, somewhat slight violaceous appearance present to bilateral lower extremities, spider veins present.  Mild bilateral lower extremity nonpitting edema.  Neuro: Tinel's sign positive left tibial nerve distribution which appears to be hypersensitive.  Derm: Left medial ankle with superficial blister present to the area which appears to be filled with serous fluid, no purulence, no obvious fluctuance present to the area, some surrounding erythema and edema present to this area that extends to the anterior ankle from the medial ankle but does not appear to be streaking up the leg further.    MSK: Less tenderness overall palpation of the left foot and ankle but still present mostly about the medial ankle around the previous incision around the tibial nerve/tarsal tunnel distribution.  Still appears to have a dry scab present over this area, no current drainage or obvious openings present.  Great reduction in erythema and edema present since admission.  Patient able to dorsiflex and plantarflex digits but still has little range of motion to the ankle left.  Results for orders placed or performed during the hospital encounter of 01/06/24 (from the past 48 hours)  Lactic acid, plasma     Status: None   Collection Time: 01/06/24  1:07 PM  Result Value Ref Range   Lactic Acid, Venous 1.3 0.5 - 1.9 mmol/L    Comment: Performed at [facility], [address], [place], [place] [address]  Comprehensive metabolic panel     Status: Abnormal   Collection Time: 01/06/24  1:07 PM  Result Value Ref Range   Sodium 135 135 - 145 mmol/L   Potassium 4.8 3.5 - 5.1 mmol/L   Chloride 101 98 - 111 mmol/L   CO2 20 (L) 22 - 32 mmol/L   Glucose, Bld 319 (H) 70 - 99 mg/dL    Comment: Glucose reference range applies only to samples taken after fasting for at least 8 hours.   BUN 38 (H) 8 - 23 mg/dL   Creatinine, Ser 8.16 (H) 0.61 - 1.24 mg/dL   Calcium 9.5 8.9 - 89.6 mg/dL   Total Protein 6.8 6.5 - 8.1 g/dL   Albumin 4.0 3.5 - 5.0 g/dL   AST 18 15 - 41 U/L   ALT 16 0 - 44 U/L   Alkaline  Phosphatase 92 38 - 126 U/L   Total Bilirubin 0.9 0.0 - 1.2 mg/dL   GFR, Estimated 39 (L) >60 mL/min    Comment: (NOTE) Calculated using the CKD-EPI Creatinine Equation (2021)    Anion gap 14 5 - 15    Comment: Performed at [facility], [address] [place], [place] [address]  CBC with Differential     Status: None   Collection Time: 01/06/24  1:07 PM  Result Value Ref Range   WBC 8.2 4.0 - 10.5 K/uL   RBC 5.46 4.22 - 5.81 MIL/uL   Hemoglobin 15.8 13.0 - 17.0 g/dL   HCT 52.5 60.9 - 47.9 %   MCV 86.8 80.0 - 100.0 fL   MCH 28.9 26.0 - 34.0 pg   MCHC 33.3 30.0 - 36.0 g/dL   RDW 86.7 88.4 - 84.4 %   Platelets 235 150 - 400 K/uL   nRBC 0.0 0.0 - 0.2 %   Neutrophils Relative % 61 %   Neutro Abs 5.0 1.7 - 7.7 K/uL   Lymphocytes Relative 24 %   Lymphs Abs 2.0 0.7 - 4.0 K/uL   Monocytes Relative 9 %   Monocytes Absolute 0.7 0.1 - 1.0 K/uL   Eosinophils Relative 4 %   Eosinophils Absolute 0.4 0.0 - 0.5 K/uL   Basophils Relative 1 %   Basophils Absolute 0.1 0.0 - 0.1 K/uL   Immature Granulocytes 1 %   Abs Immature Granulocytes 0.07 0.00 - 0.07 K/uL    Comment: Performed at [facility], [address] [place], [place] [address]  Aerobic/Anaerobic Culture w Gram Stain (surgical/deep wound)     Status: None (Preliminary result)   Collection  Time: 01/06/24  7:04 PM   Specimen: Wound  Result Value Ref Range   Specimen Description      WOUND Performed at [facility], [address] [place], [place] [address]    Special Requests      LL Performed at [facility], [address] [place], [place] [address]    Gram Stain NO WBC SEEN NO ORGANISMS SEEN     Culture      NO GROWTH < 12 HOURS Performed at [facility], [address] [place], [place] [address]    Report Status PENDING   Urinalysis, w/ Reflex to Culture (Infection Suspected) -Urine, Clean Catch     Status: Abnormal   Collection Time: 01/06/24  8:16 PM  Result Value Ref Range   Specimen Source URINE, CLEAN CATCH    Color, Urine YELLOW (A) YELLOW   APPearance HAZY (A) CLEAR   Specific Gravity, Urine 1.016 1.005 - 1.030   pH 5.0 5.0 - 8.0   Glucose, UA NEGATIVE NEGATIVE mg/dL   Hgb urine dipstick NEGATIVE NEGATIVE   Bilirubin Urine NEGATIVE NEGATIVE   Ketones, ur NEGATIVE NEGATIVE mg/dL   Protein, ur 30 (A) NEGATIVE mg/dL   Nitrite NEGATIVE NEGATIVE   Leukocytes,Ua NEGATIVE NEGATIVE   RBC / HPF 0-5 0 - 5 RBC/hpf   WBC, UA 0-5 0 - 5 WBC/hpf    Comment:        Reflex urine culture not performed if WBC <=10, OR if Squamous epithelial cells >5. If Squamous epithelial cells >5 suggest recollection.    Bacteria, UA NONE SEEN NONE SEEN   Squamous Epithelial / HPF 0-5 0 - 5 /HPF   Mucus PRESENT    Hyaline Casts, UA PRESENT     Comment: Performed at [facility],  [address] [place], [place] [address]  Glucose, capillary     Status: Abnormal   Collection Time: 01/06/24 10:29 PM  Result Value Ref Range   Glucose-Capillary 142 (H) 70 - 99 mg/dL    Comment: Glucose reference range applies only to samples taken after fasting for at least 8 hours.  Basic metabolic panel with GFR     Status: Abnormal   Collection Time: 01/07/24  6:09 AM  Result Value Ref Range   Sodium 137 135 - 145 mmol/L   Potassium 4.5 3.5 - 5.1  mmol/L   Chloride 107 98 - 111 mmol/L   CO2 23 22 - 32 mmol/L   Glucose, Bld 137 (H) 70 - 99 mg/dL    Comment: Glucose reference range applies only to samples taken after fasting for at least 8 hours.   BUN 33 (H) 8 - 23 mg/dL   Creatinine, Ser 8.48 (H) 0.61 - 1.24 mg/dL   Calcium 8.6 (L) 8.9 - 10.3 mg/dL   GFR, Estimated 49 (L) >60 mL/min    Comment: (NOTE) Calculated using the CKD-EPI Creatinine Equation (2021)    Anion gap 7 5 - 15    Comment: Performed at [facility], [address] [place], [place] [address]  CBC     Status: None   Collection Time: 01/07/24  6:09 AM  Result Value Ref Range   WBC 5.7 4.0 - 10.5 K/uL   RBC 4.90 4.22 - 5.81 MIL/uL   Hemoglobin 14.2 13.0 - 17.0 g/dL   HCT 57.2 60.9 - 47.9 %   MCV 87.1 80.0 - 100.0 fL   MCH 29.0 26.0 - 34.0 pg   MCHC 33.3 30.0 - 36.0 g/dL   RDW 86.7 88.4 - 84.4 %   Platelets 182 150 - 400 K/uL   nRBC 0.0 0.0 - 0.2 %    Comment: Performed at [facility], [address] [place], [place] [address]  Glucose, capillary     Status: Abnormal   Collection Time: 01/07/24  8:26 AM  Result Value Ref Range   Glucose-Capillary 132 (H) 70 - 99 mg/dL    Comment: Glucose reference range applies only to samples taken after fasting for at least 8 hours.  Glucose, capillary     Status: Abnormal   Collection Time: 01/07/24 12:04 PM  Result Value Ref Range   Glucose-Capillary 271 (H) 70 - 99 mg/dL    Comment: Glucose reference range applies only to samples taken after fasting for at least 8 hours.  Glucose, capillary     Status: Abnormal   Collection Time: 01/07/24  4:25 PM  Result Value Ref Range   Glucose-Capillary 116 (H) 70 - 99 mg/dL    Comment: Glucose reference range applies only to samples taken after fasting for at least 8 hours.  Glucose, capillary     Status: Abnormal   Collection Time: 01/07/24  8:50 PM  Result Value Ref Range   Glucose-Capillary 158 (H) 70 - 99 mg/dL    Comment: Glucose reference range  applies only to samples taken after fasting for at least 8 hours.  Basic metabolic panel with GFR     Status: Abnormal   Collection Time: 01/08/24  4:53 AM  Result Value Ref Range   Sodium 130 (L) 135 - 145 mmol/L    Comment: RESULTS VERIFIED BY REPEAT TESTING MJU   Potassium 4.8 3.5 - 5.1 mmol/L   Chloride 101 98 - 111 mmol/L   CO2 23 22 - 32  mmol/L   Glucose, Bld 403 (H) 70 - 99 mg/dL    Comment: Glucose reference range applies only to samples taken after fasting for at least 8 hours.   BUN 28 (H) 8 - 23 mg/dL   Creatinine, Ser 8.37 (H) 0.61 - 1.24 mg/dL   Calcium 8.4 (L) 8.9 - 10.3 mg/dL   GFR, Estimated 45 (L) >60 mL/min    Comment: (NOTE) Calculated using the CKD-EPI Creatinine Equation (2021)    Anion gap 6 5 - 15    Comment: Performed at [facility], [address] [place], [place] [address]  Glucose, capillary     Status: Abnormal   Collection Time: 01/08/24  7:50 AM  Result Value Ref Range   Glucose-Capillary 351 (H) 70 - 99 mg/dL    Comment: Glucose reference range applies only to samples taken after fasting for at least 8 hours.   US  ARTERIAL ABI (SCREENING LOWER EXTREMITY) Result Date: 01/08/2024 CLINICAL DATA:  Left ankle wound.  History of diabetes. EXAM: NONINVASIVE PHYSIOLOGIC VASCULAR STUDY OF BILATERAL LOWER EXTREMITIES TECHNIQUE: Evaluation of both lower extremities were performed at rest, including calculation of ankle-brachial indices with single level Doppler, pressure and pulse volume recording. COMPARISON:  None Available. FINDINGS: Right ABI:  1.06 Left ABI:  1.20 Right Lower Extremity:  Normal arterial waveforms at the ankle. Left Lower Extremity:  Normal arterial waveforms at the ankle. 1.0-1.4 Normal IMPRESSION: Normal resting ankle-brachial indices and distal waveforms. No evidence of significant arterial occlusive disease in either lower extremity. Electronically Signed   By: Marcey Moan M.D.   On: 01/08/2024 08:12    Blood pressure 113/87,  pulse (!) 59, temperature 97.6 F (36.4 C), temperature source Oral, resp. rate 16, weight 83.2 kg, SpO2 96%.  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  No signs for deeper infection based on MRI. - Clinically erythema and edema appear to be lessened compared to initial hospitalization, wound to the left medial ankle appears to be scabbed over at this time with no obvious openings.  Still has a dry crust type appearance around the ulcerative area.  Patient does have family history of some sort of cancer to his father's leg which caused him to lose his leg.  His father had a motorcycle accident and ended up with subsequent wound to the top of his foot that did not heal for years and ended up with cancer to the wound leading to amputation. - Redressed today with Xeroform to the area of the blister/scab which appears healed followed by 4 x 4 gauze, Kerlix, Ace wrap, Kerlix, Ace wrap with compression.  Appears to be helping.  Will still treat this as a venous type ulceration for now as it appears to be improving. -Appreciate ID recommendations for antibiotic therapy.  Unsure if infection is actually present as labs are all within normal limits but does appear to be improving with antibiotics and with compression.  No growth from wound swab that was taken from blister type fluid around the left ankle. - Discussed ABI results.  ABIs appear to be within normal limits.  Appreciate vascular recommendations. - Lastly discussed what infectious disease told patient about possibly having a squamous cell carcinoma to the area.  Discussed with patient that  it is possible and that wounds that remain nonhealing for up to 90 days warrant  potentially a biopsy to the area.  Patient has had this issue since April.  Discussed punch biopsy in detail.  Will discuss this with infectious disease and for now plan on doing it on Monday at bedside.  Will take a small punch biopsy from the area and sent off to pathology.  Prentice Lee, DPM 01/08/2024, 8:19 AM

## 2024-01-08 NOTE — Plan of Care (Signed)

## 2024-01-08 NOTE — Progress Notes (Signed)
 PROGRESS NOTE    Jared Hess  FMW:[id number] DOB: May 26, 1951 DOA: 01/06/2024 PCP: Lenon Layman ORN, MD   Assessment & Plan:   Principal Problem:   Cellulitis Active Problems:   Wound infection   Neuropathy   Dementia (HCC)   Type 1 diabetes mellitus (HCC)   CKD stage 3a, GFR 45-59 ml/min (HCC)   AKI (acute kidney injury) (HCC)   Rash   Hypothyroidism  Assessment and Plan: Left ankle cellulitis: w/ chronic wound x 3-4 months. Continue on IV zosyn , linezolid  as per ID. ABI is normal. Will have punch biopsy tomorrow as per podiatry.  ID and podiatry following and recs apprec   Hypothyroidism: continue on levothyroxine    Rash: right flank, gluteal area & inguinal area. Benadryl  prn    AKI on CKDIIIa: Cr is labile. Avoid nephrotoxic meds    DM1: HbA1c 7.6, poorly controlled. Continue on glargine, SSI w/ accuchecks      DVT prophylaxis: heparin   Code Status: full  Family Communication:  Disposition Plan: likely d/c back home   Level of care: Telemetry Medical  Status is: Inpatient Remains inpatient appropriate because: severity of illness, requiring IV abxs & will have punch biopsy tomorrow     Consultants:  ID Vasc surg Podiatry   Procedures:   Antimicrobials: zosyn , linezolid    Subjective: Pt c/o fatigue.   Objective: Vitals:   01/07/24 1631 01/07/24 1950 01/08/24 0350 01/08/24 0746  BP: 112/75 115/79 (!) 105/53 113/87  Pulse: 73 62 63 (!) 59  Resp: 17 18 18 16   Temp: 98.1 F (36.7 C) 97.7 F (36.5 C) 98.2 F (36.8 C) 97.6 F (36.4 C)  TempSrc:  Oral  Oral  SpO2: 99% 97% 93% 96%  Weight:        Intake/Output Summary (Last 24 hours) at 01/08/2024 0836 Last data filed at 01/08/2024 0630 Gross per 24 hour  Intake 480 ml  Output --  Net 480 ml   Filed Weights   01/06/24 1301  Weight: 83.2 kg    Examination:  General exam: appears comfortable  Respiratory system: clear breath sounds b/l Cardiovascular system: S1/S2+. No rubs or  clicks   Gastrointestinal system: abd is soft, NT, ND & hypoactive bowel sounds  Central nervous system: alert & oriented. Moves all extremities  Psychiatry: Judgement and insight appear normal. Appropriate mood and affect.     Data Reviewed: I have personally reviewed following labs and imaging studies  CBC: Recent Labs  Lab 01/01/24 1341 01/03/24 1605 01/05/24 0427 01/06/24 1307 01/07/24 0609  WBC 7.2 6.9 5.4 8.2 5.7  NEUTROABS 4.2 4.0  --  5.0  --   HGB 14.1 14.6 13.6 15.8 14.2  HCT 42.2 44.5 40.8 47.4 42.7  MCV 87.2 89.0 86.8 86.8 87.1  PLT 209 200 185 235 182   Basic Metabolic Panel: Recent Labs  Lab 01/03/24 1605 01/05/24 0427 01/06/24 1307 01/07/24 0609 01/08/24 0453  NA 137 135 135 137 130*  K 4.3 4.9 4.8 4.5 4.8  CL 104 102 101 107 101  CO2 24 24 20* 23 23  GLUCOSE 80 214* 319* 137* 403*  BUN 30* 24* 38* 33* 28*  CREATININE 1.61* 1.57* 1.83* 1.51* 1.62*  CALCIUM 9.2 8.8* 9.5 8.6* 8.4*  MG  --  1.8  --   --   --    GFR: Estimated Creatinine Clearance: 48 mL/min (A) (by C-G formula based on SCr of 1.62 mg/dL (H)). Liver Function Tests: Recent Labs  Lab 01/01/24 1341 01/03/24 1605 01/06/24 1307  AST 28 23 18   ALT 21 20 16   ALKPHOS 82 85 92  BILITOT 1.1 0.8 0.9  PROT 6.6 7.4 6.8  ALBUMIN 3.9 4.5 4.0   No results for input(s): LIPASE, AMYLASE in the last 168 hours. No results for input(s): AMMONIA in the last 168 hours. Coagulation Profile: No results for input(s): INR, PROTIME in the last 168 hours. Cardiac Enzymes: No results for input(s): CKTOTAL, CKMB, CKMBINDEX, TROPONINI in the last 168 hours. BNP (last 3 results) No results for input(s): PROBNP in the last 8760 hours. HbA1C: No results for input(s): HGBA1C in the last 72 hours. CBG: Recent Labs  Lab 01/07/24 0826 01/07/24 1204 01/07/24 1625 01/07/24 2050 01/08/24 0750  GLUCAP 132* 271* 116* 158* 351*   Lipid Profile: No results for input(s): CHOL, HDL,  LDLCALC, TRIG, CHOLHDL, LDLDIRECT in the last 72 hours. Thyroid Function Tests: No results for input(s): TSH, T4TOTAL, FREET4, T3FREE, THYROIDAB in the last 72 hours. Anemia Panel: No results for input(s): VITAMINB12, FOLATE, FERRITIN, TIBC, IRON, RETICCTPCT in the last 72 hours. Sepsis Labs: Recent Labs  Lab 01/03/24 1605 01/06/24 1307  LATICACIDVEN 0.8 1.3    Recent Results (from the past 240 hours)  Blood culture (routine x 2)     Status: None (Preliminary result)   Collection Time: 01/04/24  1:21 AM   Specimen: BLOOD  Result Value Ref Range Status   Specimen Description BLOOD LEFT ANTECUBITAL  Final   Special Requests   Final    BOTTLES DRAWN AEROBIC AND ANAEROBIC Blood Culture results may not be optimal due to an excessive volume of blood received in culture bottles   Culture   Final    NO GROWTH 4 DAYS Performed at [facility], [address], [place], [place] [address]    Report Status PENDING  Incomplete  Blood culture (routine x 2)     Status: None (Preliminary result)   Collection Time: 01/04/24  1:21 AM   Specimen: BLOOD  Result Value Ref Range Status   Specimen Description BLOOD BLOOD LEFT HAND  Final   Special Requests   Final    BOTTLES DRAWN AEROBIC AND ANAEROBIC Blood Culture results may not be optimal due to an excessive volume of blood received in culture bottles   Culture   Final    NO GROWTH 4 DAYS Performed at [facility], [address] [place], [place] [address]    Report Status PENDING  Incomplete  Aerobic/Anaerobic Culture w Gram Stain (surgical/deep wound)     Status: None (Preliminary result)   Collection Time: 01/06/24  7:04 PM   Specimen: Wound  Result Value Ref Range Status   Specimen Description   Final    WOUND Performed at [facility], [address], [place], [place] [address]    Special Requests   Final    LL Performed at [facility], [address],  [place], [place] [address]    Gram Stain NO WBC SEEN NO ORGANISMS SEEN   Final   Culture   Final    NO GROWTH < 12 HOURS Performed at [facility], [address], [place], [place] [address]    Report Status PENDING  Incomplete         Radiology Studies: US  ARTERIAL ABI (SCREENING LOWER EXTREMITY) Result Date: 01/08/2024 CLINICAL DATA:  Left ankle wound.  History of diabetes. EXAM: NONINVASIVE PHYSIOLOGIC VASCULAR STUDY OF BILATERAL LOWER EXTREMITIES TECHNIQUE: Evaluation of both lower extremities were performed at rest, including  calculation of ankle-brachial indices with single level Doppler, pressure and pulse volume recording. COMPARISON:  None Available. FINDINGS: Right ABI:  1.06 Left ABI:  1.20 Right Lower Extremity:  Normal arterial waveforms at the ankle. Left Lower Extremity:  Normal arterial waveforms at the ankle. 1.0-1.4 Normal IMPRESSION: Normal resting ankle-brachial indices and distal waveforms. No evidence of significant arterial occlusive disease in either lower extremity. Electronically Signed   By: Marcey Moan M.D.   On: 01/08/2024 08:12        Scheduled Meds:  heparin   5,000 Units Subcutaneous Q8H   insulin  aspart  0-20 Units Subcutaneous TID WC   insulin  aspart  0-5 Units Subcutaneous QHS   insulin  glargine-yfgn  16 Units Subcutaneous BH-q7a   levothyroxine   112 mcg Oral QAC breakfast   Continuous Infusions:  linezolid  (ZYVOX ) IV 600 mg (01/08/24 0818)   piperacillin -tazobactam (ZOSYN )  IV 3.375 g (01/08/24 0630)     LOS: 2 days       Anthony CHRISTELLA Pouch, MD [facility] Pager [phone]  If 7PM-7AM, please contact night-coverage [email or web address] 01/08/2024, 8:36 AM

## 2024-01-08 NOTE — Progress Notes (Signed)
 Date of Admission:  01/06/2024     ID: Jared Hess is a 73 y.o. male  Principal Problem:   Cellulitis Active Problems:   Neuropathy   Dementia (HCC)   Type 1 diabetes mellitus (HCC)   Wound infection   CKD stage 3a, GFR 45-59 ml/min (HCC)   AKI (acute kidney injury) (HCC)   Rash   Hypothyroidism    Subjective: Pt is doing better Says the left ankle is less painful and less stiff  Medications:   heparin   5,000 Units Subcutaneous Q8H   insulin  aspart  0-20 Units Subcutaneous TID WC   insulin  aspart  0-5 Units Subcutaneous QHS   insulin  glargine-yfgn  16 Units Subcutaneous BH-q7a   levothyroxine   112 mcg Oral QAC breakfast    Objective: Vital signs in last 24 hours: Patient Vitals for the past 24 hrs:  BP Temp Temp src Pulse Resp SpO2  01/08/24 1456 107/68 97.7 F (36.5 C) Oral 68 17 96 %  01/08/24 0746 113/87 97.6 F (36.4 C) Oral (!) 59 16 96 %  01/08/24 0350 (!) 105/53 98.2 F (36.8 C) -- 63 18 93 %  01/07/24 1950 115/79 97.7 F (36.5 C) Oral 62 18 97 %  01/07/24 1631 112/75 98.1 F (36.7 C) -- 73 17 99 %     Lines and Device Date on insertion # of days DC  Engineer, technical sales     ETT       PHYSICAL EXAM:  General: Alert, cooperative, no distress, appears stated age.  Head: Normocephalic, without obvious abnormality, atraumatic. Eyes: Conjunctivae clear, anicteric sclerae. Pupils are equal ENT Nares normal. No drainage or sinus tenderness. Lips, mucosa, and tongue normal. No Thrush Neck: Supple, symmetrical, no adenopathy, thyroid: non tender no carotid bruit and no JVD. Back: No CVA tenderness. Lungs: Clear to auscultation bilaterally. No Wheezing or Rhonchi. No rales. Heart: Regular rate and rhythm, no murmur, rub or gallop. Abdomen: Soft, non-tender,not distended. Bowel sounds normal. No masses Extremities: left leg dressing not removed  Skin: No rashes or lesions. Or bruising Lymph: Cervical, supraclavicular  normal. Neurologic: Grossly non-focal  Lab Results    Latest Ref Rng & Units 01/07/2024    6:09 AM 01/06/2024    1:07 PM 01/05/2024    4:27 AM  CBC  WBC 4.0 - 10.5 K/uL 5.7  8.2  5.4   Hemoglobin 13.0 - 17.0 g/dL 85.7  84.1  86.3   Hematocrit 39.0 - 52.0 % 42.7  47.4  40.8   Platelets 150 - 400 K/uL 182  235  185        Latest Ref Rng & Units 01/08/2024    4:53 AM 01/07/2024    6:09 AM 01/06/2024    1:07 PM  CMP  Glucose 70 - 99 mg/dL 596  862  680   BUN 8 - 23 mg/dL 28  33  38   Creatinine 0.61 - 1.24 mg/dL 8.37  8.48  8.16   Sodium 135 - 145 mmol/L 130  137  135   Potassium 3.5 - 5.1 mmol/L 4.8  4.5  4.8   Chloride 98 - 111 mmol/L 101  107  101   CO2 22 - 32 mmol/L 23  23  20    Calcium 8.9 - 10.3 mg/dL 8.4  8.6  9.5   Total Protein 6.5 - 8.1 g/dL   6.8   Total Bilirubin 0.0 - 1.2 mg/dL   0.9   Alkaline Phos 38 -  126 U/L   92   AST 15 - 41 U/L   18   ALT 0 - 44 U/L   16       Microbiology: WC ng so far Studies/Results: US  ARTERIAL ABI (SCREENING LOWER EXTREMITY) Result Date: 01/08/2024 CLINICAL DATA:  Left ankle wound.  History of diabetes. EXAM: NONINVASIVE PHYSIOLOGIC VASCULAR STUDY OF BILATERAL LOWER EXTREMITIES TECHNIQUE: Evaluation of both lower extremities were performed at rest, including calculation of ankle-brachial indices with single level Doppler, pressure and pulse volume recording. COMPARISON:  None Available. FINDINGS: Right ABI:  1.06 Left ABI:  1.20 Right Lower Extremity:  Normal arterial waveforms at the ankle. Left Lower Extremity:  Normal arterial waveforms at the ankle. 1.0-1.4 Normal IMPRESSION: Normal resting ankle-brachial indices and distal waveforms. No evidence of significant arterial occlusive disease in either lower extremity. Electronically Signed   By: Marcey Moan M.D.   On: 01/08/2024 08:12     Assessment/Plan: Chronic left medial malleolus wound for 3-4 months now  at the site of previous surgical scar HE has taken 2 courses of Doxy 9  April/May) and recently keflex  and bactrim  since 7/20 Cellulitis of the ankle with stiffness and lack of ankle movt  Chronic wound On zosyn  and linezolid  There is no underlying osteo Wound culture neg- can DC zosyn  and continue just linezolid  Keep leg elevated- compression wrap helping him Once the erythema resolves we can delineate the wound better and then a biopsy may be needed to r/o SCC There is travel history with visits to [place]- once we treat the cellulitis we may look into this further to see it is causal   ESR/CRP/WBC N   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   ________________________________________________ Discussed the management with the patient and care team

## 2024-01-09 DIAGNOSIS — B952 Enterococcus as the cause of diseases classified elsewhere: Secondary | ICD-10-CM

## 2024-01-09 DIAGNOSIS — G2582 Stiff-man syndrome: Secondary | ICD-10-CM | POA: Diagnosis not present

## 2024-01-09 DIAGNOSIS — A498 Other bacterial infections of unspecified site: Secondary | ICD-10-CM

## 2024-01-09 DIAGNOSIS — L03116 Cellulitis of left lower limb: Secondary | ICD-10-CM | POA: Diagnosis not present

## 2024-01-09 DIAGNOSIS — L089 Local infection of the skin and subcutaneous tissue, unspecified: Secondary | ICD-10-CM | POA: Diagnosis not present

## 2024-01-09 DIAGNOSIS — S91002S Unspecified open wound, left ankle, sequela: Secondary | ICD-10-CM | POA: Diagnosis not present

## 2024-01-09 LAB — CULTURE, BLOOD (ROUTINE X 2)
Culture: NO GROWTH
Culture: NO GROWTH

## 2024-01-09 LAB — GLUCOSE, CAPILLARY
Glucose-Capillary: 216 mg/dL — ABNORMAL HIGH (ref 70–99)
Glucose-Capillary: 273 mg/dL — ABNORMAL HIGH (ref 70–99)
Glucose-Capillary: 180 mg/dL — ABNORMAL HIGH (ref 70–99)
Glucose-Capillary: 144 mg/dL — ABNORMAL HIGH (ref 70–99)

## 2024-01-09 LAB — BASIC METABOLIC PANEL WITH GFR
Anion gap: 7 (ref 5–15)
BUN: 22 mg/dL (ref 8–23)
CO2: 26 mmol/L (ref 22–32)
Calcium: 8.8 mg/dL — ABNORMAL LOW (ref 8.9–10.3)
Chloride: 103 mmol/L (ref 98–111)
Creatinine, Ser: 1.72 mg/dL — ABNORMAL HIGH (ref 0.61–1.24)
GFR, Estimated: 42 mL/min — ABNORMAL LOW (ref >60)
Glucose, Bld: 227 mg/dL — ABNORMAL HIGH (ref 70–99)
Potassium: 4.3 mmol/L (ref 3.5–5.1)
Sodium: 136 mmol/L (ref 135–145)

## 2024-01-09 MED ORDER — LINEZOLID 600 MG PO TABS
600.0000 mg | ORAL_TABLET | Freq: Two times a day (BID) | ORAL | Status: DC
  Administered 2024-01-09 – 2024-01-11 (×4): 600 mg via ORAL
  Filled 2024-01-09 (×4): qty 1

## 2024-01-09 MED ORDER — GABAPENTIN 100 MG PO CAPS: 100.0000 mg | ORAL_CAPSULE | Freq: Three times a day (TID) | ORAL | Status: DC

## 2024-01-09 MED ORDER — GABAPENTIN 300 MG PO CAPS
300.0000 mg | ORAL_CAPSULE | Freq: Three times a day (TID) | ORAL | Status: DC
  Administered 2024-01-09 – 2024-01-11 (×6): 300 mg via ORAL
  Filled 2024-01-09 (×6): qty 1
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
## 2024-01-09 NOTE — Progress Notes (Signed)
 PODIATRY / FOOT AND ANKLE SURGERY PROGRESS NOTE  Requesting Physician: Dr. Willo  Reason for consult: Left medial ankle wound/cellulitis   HPI: Jared Hess is a 73 y.o. male who presents in bed resting fairly comfortably.  He notes reduction in pain since last visit on Friday.  He rates his pain today at 4/10.  He notes that now he is able to move his toes a little bit which is a great improvement as he has not really been able to do much motion for the past 3 months or so.  He notes not much change overall since yesterday.  He did have some increased pain last night which required him to take some Tylenol .  Patient currently denies nausea, vomiting, fevers, chills.  PMHx:  Past Medical History:  Diagnosis Date   Bilateral carpal tunnel syndrome    Chronic bilateral low back pain without sciatica    CKD (chronic kidney disease) stage 3, GFR 30-59 ml/min (HCC)    Dementia (HCC)    DM (diabetes mellitus), type 1 (HCC)    ED (erectile dysfunction)    Hypothyroidism    Neuropathy    Prostate cancer (HCC)    Thyroid disease     Surgical Hx:  Past Surgical History:  Procedure Laterality Date   BICEPT TENODESIS Right 07/14/2021   Procedure: BICEPS TENODESIS;  Surgeon: Marchia Drivers, MD;  Location: [facility];  Service: Orthopedics;  Laterality: Right;   CARPAL TUNNEL RELEASE Right 07/12/2023   Procedure: RIGHT CARPAL TUNNEL RELEASE WITH ULTRASOUND GUIDANCE;  Surgeon: Claudene Penne ORN, MD;  Location: [facility];  Service: Neurosurgery;  Laterality: Right;   CARPAL TUNNEL RELEASE Left 08/09/2023   Procedure: LEFT CARPAL TUNNEL RELEASE WITH ULTRASOUND GUIDANCE;  Surgeon: Claudene Penne ORN, MD;  Location: [facility];  Service: Neurosurgery;  Laterality: Left;   FOOT SURGERY Left    LUMBAR FUSION     PROSTATE CRYOABLATION  2010   SHOULDER ARTHROSCOPY WITH OPEN ROTATOR CUFF REPAIR AND DISTAL CLAVICLE ACROMINECTOMY Right 07/14/2021   Procedure: SHOULDER ARTHROSCOPY WITH OPEN ROTATOR CUFF REPAIR  AND DISTAL CLAVICLE ACROMINECTOMY;  Surgeon: Marchia Drivers, MD;  Location: [facility];  Service: Orthopedics;  Laterality: Right;   shoulder blade surgery  2002    FHx:  Family History  Problem Relation Age of Onset   Diabetes Sister    Prostate cancer Neg Hx    Bladder Cancer Neg Hx    Kidney cancer Neg Hx     Social History:  reports that he has never smoked. He has never used smokeless tobacco. He reports that he does not currently use alcohol. He reports that he does not use drugs.  Allergies:  Allergies  Allergen Reactions   Armoracia Rusticana Ext (Horseradish) Itching, Nausea And Vomiting, Nausea Only and Swelling   Tree Extract Itching, Anaphylaxis and Shortness Of Breath    Walnuts-Throat closes up   [employer] Anaphylaxis, Shortness Of Breath, Itching, Nausea And Vomiting, Nausea Only and Swelling    Medications Prior to Admission  Medication Sig Dispense Refill   cephALEXin  (KEFLEX ) 500 MG capsule Take 1 capsule (500 mg total) by mouth 4 (four) times daily for 10 days. 40 capsule 0   Cyanocobalamin (VITAMIN B-12) 2500 MCG SUBL Place 2,500 mcg under the tongue in the morning.     HYDROcodone -acetaminophen  (NORCO/VICODIN) 5-325 MG tablet TAKE 1 TABLET BY MOUTH EVERY 4 HOURS AS NEEDED FOR UP TO 7 DAYS FOR MODERATE PAIN (PAIN SCORE 4-6)     insulin  lispro (ADMELOG SOLOSTAR) 100  UNIT/ML KwikPen Inject 0-6 Units into the skin with breakfast, with lunch, and with evening meal. Sliding scale     Insulin  Pen Needle (BD PEN NEEDLE NANO ULTRAFINE) 32G X 4 MM MISC 1 each by Other route.  Use 1 each 4 (four) times daily     LANTUS  SOLOSTAR 100 UNIT/ML Solostar Pen INJECT 20 UNITS SUBCUTANEOUSLY AT BEDTIME     levothyroxine  (SYNTHROID ) 112 MCG tablet Take 112 mcg by mouth daily before breakfast.     sulfamethoxazole -trimethoprim  (BACTRIM  DS) 800-160 MG tablet Take 1 tablet by mouth 2 (two) times daily for 10 days. 20 tablet 0   CREON 24000-76000 units CPEP Take 2 capsules by mouth 3  (three) times daily before meals. (Patient not taking: Reported on 11/01/2023)     gabapentin  (NEURONTIN ) 300 MG capsule Take 1 capsule (300 mg total) by mouth at bedtime. Please take once at night for one week. May increase to twice a day if tolerating a day (Patient not taking: Reported on 11/01/2023) 90 capsule 2   TRESIBA FLEXTOUCH 100 UNIT/ML FlexTouch Pen Inject 20 Units into the skin every morning. (Patient not taking: Reported on 01/06/2024)      Physical Exam: General: Alert and oriented.  No apparent distress.  Vascular: DP/PT pulses faintly palpable bilateral, capillary fill time appears to be intact to digits bilaterally, somewhat slight violaceous appearance present to bilateral lower extremities, spider veins present.  Mild bilateral lower extremity nonpitting edema.  Neuro: Tinel's sign positive left tibial nerve distribution which appears to be hypersensitive.  Derm: Left medial ankle wound/blister appears to be completely healed today with no openings, reduction in edema, minimal erythema present.  No palpable fluctuance to this area, large amount of scar tissue present to this area from previous surgical procedure to many years ago.  MSK: Still appears to be hypersensitive on any light touch sensation to the left foot or ankle but overall less sensitive to the plantar foot but still appears to be about the medial ankle going all the way up to the leg and knee.  Results for orders placed or performed during the hospital encounter of 01/06/24 (from the past 48 hours)  Glucose, capillary     Status: Abnormal   Collection Time: 01/07/24  4:25 PM  Result Value Ref Range   Glucose-Capillary 116 (H) 70 - 99 mg/dL    Comment: Glucose reference range applies only to samples taken after fasting for at least 8 hours.  Glucose, capillary     Status: Abnormal   Collection Time: 01/07/24  8:50 PM  Result Value Ref Range   Glucose-Capillary 158 (H) 70 - 99 mg/dL    Comment: Glucose  reference range applies only to samples taken after fasting for at least 8 hours.  Basic metabolic panel with GFR     Status: Abnormal   Collection Time: 01/08/24  4:53 AM  Result Value Ref Range   Sodium 130 (L) 135 - 145 mmol/L    Comment: RESULTS VERIFIED BY REPEAT TESTING MJU   Potassium 4.8 3.5 - 5.1 mmol/L   Chloride 101 98 - 111 mmol/L   CO2 23 22 - 32 mmol/L   Glucose, Bld 403 (H) 70 - 99 mg/dL    Comment: Glucose reference range applies only to samples taken after fasting for at least 8 hours.   BUN 28 (H) 8 - 23 mg/dL   Creatinine, Ser 8.37 (H) 0.61 - 1.24 mg/dL   Calcium 8.4 (L) 8.9 - 10.3 mg/dL   GFR,  Estimated 45 (L) >60 mL/min    Comment: (NOTE) Calculated using the CKD-EPI Creatinine Equation (2021)    Anion gap 6 5 - 15    Comment: Performed at [facility], [address] [place], [place] [address]  Glucose, capillary     Status: Abnormal   Collection Time: 01/08/24  7:50 AM  Result Value Ref Range   Glucose-Capillary 351 (H) 70 - 99 mg/dL    Comment: Glucose reference range applies only to samples taken after fasting for at least 8 hours.  Glucose, capillary     Status: Abnormal   Collection Time: 01/08/24 12:01 PM  Result Value Ref Range   Glucose-Capillary 121 (H) 70 - 99 mg/dL    Comment: Glucose reference range applies only to samples taken after fasting for at least 8 hours.  Glucose, capillary     Status: None   Collection Time: 01/08/24  4:45 PM  Result Value Ref Range   Glucose-Capillary 95 70 - 99 mg/dL    Comment: Glucose reference range applies only to samples taken after fasting for at least 8 hours.  Glucose, capillary     Status: Abnormal   Collection Time: 01/08/24  9:18 PM  Result Value Ref Range   Glucose-Capillary 334 (H) 70 - 99 mg/dL    Comment: Glucose reference range applies only to samples taken after fasting for at least 8 hours.   Comment 1 Notify RN   Basic metabolic panel with GFR     Status: Abnormal   Collection Time:  01/09/24  3:31 AM  Result Value Ref Range   Sodium 136 135 - 145 mmol/L   Potassium 4.3 3.5 - 5.1 mmol/L   Chloride 103 98 - 111 mmol/L   CO2 26 22 - 32 mmol/L   Glucose, Bld 227 (H) 70 - 99 mg/dL    Comment: Glucose reference range applies only to samples taken after fasting for at least 8 hours.   BUN 22 8 - 23 mg/dL   Creatinine, Ser 8.27 (H) 0.61 - 1.24 mg/dL   Calcium 8.8 (L) 8.9 - 10.3 mg/dL   GFR, Estimated 42 (L) >60 mL/min    Comment: (NOTE) Calculated using the CKD-EPI Creatinine Equation (2021)    Anion gap 7 5 - 15    Comment: Performed at [facility], [address] [place], [place] [address]  Glucose, capillary     Status: Abnormal   Collection Time: 01/09/24  7:41 AM  Result Value Ref Range   Glucose-Capillary 216 (H) 70 - 99 mg/dL    Comment: Glucose reference range applies only to samples taken after fasting for at least 8 hours.  Glucose, capillary     Status: Abnormal   Collection Time: 01/09/24 11:27 AM  Result Value Ref Range   Glucose-Capillary 273 (H) 70 - 99 mg/dL    Comment: Glucose reference range applies only to samples taken after fasting for at least 8 hours.   US  ARTERIAL ABI (SCREENING LOWER EXTREMITY) Result Date: 01/08/2024 CLINICAL DATA:  Left ankle wound.  History of diabetes. EXAM: NONINVASIVE PHYSIOLOGIC VASCULAR STUDY OF BILATERAL LOWER EXTREMITIES TECHNIQUE: Evaluation of both lower extremities were performed at rest, including calculation of ankle-brachial indices with single level Doppler, pressure and pulse volume recording. COMPARISON:  None Available. FINDINGS: Right ABI:  1.06 Left ABI:  1.20 Right Lower Extremity:  Normal arterial waveforms at the ankle. Left Lower Extremity:  Normal arterial waveforms at the ankle. 1.0-1.4 Normal IMPRESSION: Normal resting ankle-brachial indices and  distal waveforms. No evidence of significant arterial occlusive disease in either lower extremity. Electronically Signed   By: Marcey Moan M.D.    On: 01/08/2024 08:12    Blood pressure 125/78, pulse 64, temperature 98.3 F (36.8 C), temperature source Oral, resp. rate 17, weight 83.2 kg, SpO2 95%.  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  No signs for deeper infection based on MRI. - Clinically erythema and edema appear to be reduced almost within normal limits.  No current openings present to left medial ankle. - Discussed performing punch biopsy to the area.  Patient consented for procedure today.  5 cc of lidocaine  with epinephrine  injected about the left medial ankle around the area of the previous blister site after prep was performed with iodine.  This did produce a lot of pain for the patient due to the hypersensitivity present.  Patient still appeared to have some pain even with light touch sensation after block was performed to the skin to this area indicative of likely some sort of neurologic pain process.  Iodine prep was then performed.  A 3 mm punch biopsy was then taken from the area of the scar tissue.  This again produced a significant amount of pain for the patient.  Was able to remove the biopsy and passed it off into a specimen cup and then this was sent off.  The biopsy site was somewhat fragmented due to the scar type nature of the area.  Hemostasis appeared to be well-controlled.  Applied bacitracin to the area followed by Xeroform to this area and the posterior heel abrasion followed by 4 x 4 gauze, Kerlix, Ace wrap from forefoot to below knee.  Still recommend dressing changes daily for now. - Overall signs of infection appear to be greatly reduced.  Wound culture negative for bacterial growth, CRP and  ESR within normal limits, white blood cell count within normal limits.  Reduction in erythema and edema present to left lower extremity overall.  Appreciate infectious disease recommendations for antibiotic therapy if needed further. - Discussed with patient that I am not sure why he is having the pain he is having to his left leg.  Appears to be some sort of neurologic process.  Patient has had significant injury to the left lower extremity in the past.  Believe that he could be in some sort or stage of CRPS at this time due to having this issue since April now without resolution.  Physical therapy ordered.  Discussed with Dr. Trudy usage of neuropathic pain agents.  Appreciate medicine recommendations. - Will have Dr. Ashley see patient tomorrow for another assessment and to get another set of eyes on this case.  Prentice Lee, DPM 01/09/2024, 1:05 PM

## 2024-01-09 NOTE — Care Management Important Message (Signed)
 Important Message  Patient Details  Name: Jared Hess MRN: [id number] Date of Birth: 11-04-1950   Important Message Given:  Yes - Medicare IM     Rosendo Couser W, CMA 01/09/2024, 12:23 PM

## 2024-01-09 NOTE — Progress Notes (Signed)
 PROGRESS NOTE    Jared Hess  FMW:[id number] DOB: [birth year] DOA: 01/06/2024 PCP: Lenon Layman ORN, MD   Assessment & Plan:   Principal Problem:   Cellulitis Active Problems:   Wound infection   Neuropathy   Dementia (HCC)   Type 1 diabetes mellitus (HCC)   CKD stage 3a, GFR 45-59 ml/min (HCC)   AKI (acute kidney injury) (HCC)   Rash   Hypothyroidism  Assessment and Plan: Left ankle cellulitis: w/ chronic wound x 3-4 months. Continue on IV zosyn , linezolid  as per ID. ABI is normal. S/p punch biopsy 01/09/24 as per podiatry. Pathology is pending.  ID and podiatry following and recs apprec   Peripheral neuropathy: continue on home dose of gabapentin    Hypothyroidism: continue on levothyroxine    Rash: right flank, gluteal area & inguinal area. Benadryl  prn    AKI on CKDIIIa: Cr is trending up today. Avoid nephrotoxic meds    DM1: HbA1c 7.6, poorly controlled. Continue on glargine, SSI w/ accuchecks      DVT prophylaxis: heparin   Code Status: full  Family Communication:  Disposition Plan: likely d/c back home   Level of care: Telemetry Medical  Status is: Inpatient Remains inpatient appropriate because: severity of illness, requiring IV abxs    Consultants:  ID Vasc surg Podiatry   Procedures:   Antimicrobials: zosyn , linezolid    Subjective: Pt c/o pain in left leg  Objective: Vitals:   01/08/24 1456 01/08/24 1935 01/09/24 0506 01/09/24 0744  BP: 107/68 111/66 122/77 125/78  Pulse: 68 71 77 64  Resp: 17 16 16 17   Temp: 97.7 F (36.5 C) 98 F (36.7 C) 98.3 F (36.8 C) 98.3 F (36.8 C)  TempSrc: Oral Oral Oral Oral  SpO2: 96% 95% 97% 95%  Weight:        Intake/Output Summary (Last 24 hours) at 01/09/2024 0836 Last data filed at 01/09/2024 0600 Gross per 24 hour  Intake 2136.24 ml  Output 1800 ml  Net 336.24 ml   Filed Weights   01/06/24 1301  Weight: 83.2 kg    Examination:  General exam: appears calm & comfortable Respiratory  system: clear breath sounds b/l Cardiovascular system: S1 & S2+. No rubs or clicks    Gastrointestinal system: abd is soft, NT, ND & hypoactive bowel sounds  Central nervous system: alert & oriented. Moves all extremities  Psychiatry: Judgement and insight appears normal. Appropriate mood and affect    Data Reviewed: I have personally reviewed following labs and imaging studies  CBC: Recent Labs  Lab 01/03/24 1605 01/05/24 0427 01/06/24 1307 01/07/24 0609  WBC 6.9 5.4 8.2 5.7  NEUTROABS 4.0  --  5.0  --   HGB 14.6 13.6 15.8 14.2  HCT 44.5 40.8 47.4 42.7  MCV 89.0 86.8 86.8 87.1  PLT 200 185 235 182   Basic Metabolic Panel: Recent Labs  Lab 01/05/24 0427 01/06/24 1307 01/07/24 0609 01/08/24 0453 01/09/24 0331  NA 135 135 137 130* 136  K 4.9 4.8 4.5 4.8 4.3  CL 102 101 107 101 103  CO2 24 20* 23 23 26   GLUCOSE 214* 319* 137* 403* 227*  BUN 24* 38* 33* 28* 22  CREATININE 1.57* 1.83* 1.51* 1.62* 1.72*  CALCIUM 8.8* 9.5 8.6* 8.4* 8.8*  MG 1.8  --   --   --   --    GFR: Estimated Creatinine Clearance: 45.2 mL/min (A) (by C-G formula based on SCr of 1.72 mg/dL (H)). Liver Function Tests: Recent Labs  Lab 01/03/24 1605 01/06/24  1307  AST 23 18  ALT 20 16  ALKPHOS 85 92  BILITOT 0.8 0.9  PROT 7.4 6.8  ALBUMIN 4.5 4.0   No results for input(s): LIPASE, AMYLASE in the last 168 hours. No results for input(s): AMMONIA in the last 168 hours. Coagulation Profile: No results for input(s): INR, PROTIME in the last 168 hours. Cardiac Enzymes: No results for input(s): CKTOTAL, CKMB, CKMBINDEX, TROPONINI in the last 168 hours. BNP (last 3 results) No results for input(s): PROBNP in the last 8760 hours. HbA1C: No results for input(s): HGBA1C in the last 72 hours. CBG: Recent Labs  Lab 01/08/24 0750 01/08/24 1201 01/08/24 1645 01/08/24 2118 01/09/24 0741  GLUCAP 351* 121* 95 334* 216*   Lipid Profile: No results for input(s): CHOL, HDL,  LDLCALC, TRIG, CHOLHDL, LDLDIRECT in the last 72 hours. Thyroid Function Tests: No results for input(s): TSH, T4TOTAL, FREET4, T3FREE, THYROIDAB in the last 72 hours. Anemia Panel: No results for input(s): VITAMINB12, FOLATE, FERRITIN, TIBC, IRON, RETICCTPCT in the last 72 hours. Sepsis Labs: Recent Labs  Lab 01/03/24 1605 01/06/24 1307  LATICACIDVEN 0.8 1.3    Recent Results (from the past 240 hours)  Blood culture (routine x 2)     Status: None   Collection Time: 01/04/24  1:21 AM   Specimen: BLOOD  Result Value Ref Range Status   Specimen Description BLOOD LEFT ANTECUBITAL  Final   Special Requests   Final    BOTTLES DRAWN AEROBIC AND ANAEROBIC Blood Culture results may not be optimal due to an excessive volume of blood received in culture bottles   Culture   Final    NO GROWTH 5 DAYS Performed at [facility], [address] [place], [place] [address]    Report Status 01/09/2024 FINAL  Final  Blood culture (routine x 2)     Status: None   Collection Time: 01/04/24  1:21 AM   Specimen: BLOOD  Result Value Ref Range Status   Specimen Description BLOOD BLOOD LEFT HAND  Final   Special Requests   Final    BOTTLES DRAWN AEROBIC AND ANAEROBIC Blood Culture results may not be optimal due to an excessive volume of blood received in culture bottles   Culture   Final    NO GROWTH 5 DAYS Performed at [facility], [address] [place], [place] [address]    Report Status 01/09/2024 FINAL  Final  Aerobic/Anaerobic Culture w Gram Stain (surgical/deep wound)     Status: None (Preliminary result)   Collection Time: 01/06/24  7:04 PM   Specimen: Wound  Result Value Ref Range Status   Specimen Description   Final    WOUND Performed at [facility], [address] [place], [place] [address]    Special Requests   Final    LL Performed at [facility], [address] [place], [place] [address]    Gram Stain    Final    NO WBC SEEN NO ORGANISMS SEEN Performed at [facility], [address] [place], [place] [address]    Culture   Final    CULTURE REINCUBATED FOR BETTER GROWTH NO ANAEROBES ISOLATED; CULTURE IN PROGRESS FOR 5 DAYS    Report Status PENDING  Incomplete         Radiology Studies: US  ARTERIAL ABI (SCREENING LOWER EXTREMITY) Result Date: 01/08/2024 CLINICAL DATA:  Left ankle wound.  History of diabetes. EXAM: NONINVASIVE PHYSIOLOGIC VASCULAR STUDY OF BILATERAL LOWER EXTREMITIES TECHNIQUE: Evaluation of both  lower extremities were performed at rest, including calculation of ankle-brachial indices with single level Doppler, pressure and pulse volume recording. COMPARISON:  None Available. FINDINGS: Right ABI:  1.06 Left ABI:  1.20 Right Lower Extremity:  Normal arterial waveforms at the ankle. Left Lower Extremity:  Normal arterial waveforms at the ankle. 1.0-1.4 Normal IMPRESSION: Normal resting ankle-brachial indices and distal waveforms. No evidence of significant arterial occlusive disease in either lower extremity. Electronically Signed   By: Marcey Moan M.D.   On: 01/08/2024 08:12        Scheduled Meds:  heparin   5,000 Units Subcutaneous Q8H   insulin  aspart  0-20 Units Subcutaneous TID WC   insulin  aspart  0-5 Units Subcutaneous QHS   insulin  glargine-yfgn  16 Units Subcutaneous BH-q7a   levothyroxine   112 mcg Oral QAC breakfast   Continuous Infusions:  linezolid  (ZYVOX ) IV Stopped (01/08/24 2124)   piperacillin -tazobactam (ZOSYN )  IV 3.375 g (01/09/24 0542)     LOS: 3 days       Anthony CHRISTELLA Pouch, MD [facility] Pager [phone]  If 7PM-7AM, please contact night-coverage [email or web address] 01/09/2024, 8:36 AM

## 2024-01-09 NOTE — Plan of Care (Signed)
   Problem: Fluid Volume: Goal: Ability to maintain a balanced intake and output will improve Outcome: Progressing   Problem: Metabolic: Goal: Ability to maintain appropriate glucose levels will improve Outcome: Progressing   Problem: Nutritional: Goal: Maintenance of adequate nutrition will improve Outcome: Progressing

## 2024-01-09 NOTE — Plan of Care (Signed)

## 2024-01-09 NOTE — Progress Notes (Signed)
 Date of Admission:  01/06/2024     ID: Jared Hess is a 73 y.o. male  Principal Problem:   Cellulitis Active Problems:   Neuropathy   Dementia (HCC)   Type 1 diabetes mellitus (HCC)   Wound infection   CKD stage 3a, GFR 45-59 ml/min (HCC)   AKI (acute kidney injury) (HCC)   Rash   Hypothyroidism    Subjective: Pstill has lot of pain left ankle Biopsy was taken today  Medications:   gabapentin   300 mg Oral TID   heparin   5,000 Units Subcutaneous Q8H   insulin  aspart  0-20 Units Subcutaneous TID WC   insulin  aspart  0-5 Units Subcutaneous QHS   insulin  glargine-yfgn  16 Units Subcutaneous BH-q7a   levothyroxine   112 mcg Oral QAC breakfast   linezolid   600 mg Oral Q12H    Objective: Vital signs in last 24 hours: Patient Vitals for the past 24 hrs:  BP Temp Temp src Pulse Resp SpO2  01/09/24 1927 117/84 97.7 F (36.5 C) Oral 64 18 96 %  01/09/24 1613 126/73 97.7 F (36.5 C) Oral 65 16 97 %  01/09/24 0744 125/78 98.3 F (36.8 C) Oral 64 17 95 %  01/09/24 0506 122/77 98.3 F (36.8 C) Oral 77 16 97 %  01/08/24 1935 111/66 98 F (36.7 C) Oral 71 16 95 %     Lines and Device Date on insertion # of days DC  Chiropractor     Foley     ETT       PHYSICAL EXAM:  General: Alert, cooperative, no distress, appears stated age.  Head: Normocephalic, without obvious abnormality, atraumatic. Eyes: Conjunctivae clear, anicteric sclerae. Pupils are equal ENT Nares normal. No drainage or sinus tenderness. Lips, mucosa, and tongue normal. No Thrush Neck: Supple, symmetrical, no adenopathy, thyroid: non tender no carotid bruit and no JVD. Back: No CVA tenderness. Lungs: Clear to auscultation bilaterally. No Wheezing or Rhonchi. No rales. Heart: Regular rate and rhythm, no murmur, rub or gallop. Abdomen: Soft, non-tender,not distended. Bowel sounds normal. No masses Extremities: left leg dressing removed- redness, edema much imrpvoed- les  sdischarge    Skin: No rashes or lesions. Or bruising Lymph: Cervical, supraclavicular normal. Neurologic: did not assess  Lab Results    Latest Ref Rng & Units 01/07/2024    6:09 AM 01/06/2024    1:07 PM 01/05/2024    4:27 AM  CBC  WBC 4.0 - 10.5 K/uL 5.7  8.2  5.4   Hemoglobin 13.0 - 17.0 g/dL 85.7  84.1  86.3   Hematocrit 39.0 - 52.0 % 42.7  47.4  40.8   Platelets 150 - 400 K/uL 182  235  185        Latest Ref Rng & Units 01/09/2024    3:31 AM 01/08/2024    4:53 AM 01/07/2024    6:09 AM  CMP  Glucose 70 - 99 mg/dL 772  596  862   BUN 8 - 23 mg/dL 22  28  33   Creatinine 0.61 - 1.24 mg/dL 8.27  8.37  8.48   Sodium 135 - 145 mmol/L 136  130  137   Potassium 3.5 - 5.1 mmol/L 4.3  4.8  4.5   Chloride 98 - 111 mmol/L 103  101  107   CO2 22 - 32 mmol/L 26  23  23    Calcium 8.9 - 10.3 mg/dL 8.8  8.4  8.6       Microbiology: WC  enterococcus faecalis Studies/Results: No results found.    Assessment/Plan: Chronic left medial malleolus wound for 3-4 months now  at the site of previous surgical scar HE has taken 2 courses of Doxy 9(April/May) and recently keflex  and bactrim  since 7/20 Cellulitis of the ankle with stiffness and lack of ankle movt  Chronic wound R/o sinus tract and underlying bone/joint infection On zosyn  and linezolid  There is no underlying osteo on MRI Wound culture  enterococcus faecalis   can DC zosyn  and continue just linezolid  PO Keep leg elevated- compression wrap  Now there es no obvious wound or ulcer   There is travel history with visits to [place]- once we treat the cellulitis we may look into this further to see it is causal  Disproportionate pain, paraesthesia left ankle with frozen ankle R/o Complex regional pain syndrome   ESR/CRP/WBC N   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   ________________________________________________ Discussed the management with the patient and wife and care team

## 2024-01-10 DIAGNOSIS — B952 Enterococcus as the cause of diseases classified elsewhere: Secondary | ICD-10-CM | POA: Diagnosis not present

## 2024-01-10 DIAGNOSIS — G2582 Stiff-man syndrome: Secondary | ICD-10-CM

## 2024-01-10 DIAGNOSIS — G90522 Complex regional pain syndrome I of left lower limb: Secondary | ICD-10-CM

## 2024-01-10 DIAGNOSIS — L03116 Cellulitis of left lower limb: Secondary | ICD-10-CM | POA: Diagnosis not present

## 2024-01-10 DIAGNOSIS — S91002S Unspecified open wound, left ankle, sequela: Secondary | ICD-10-CM | POA: Diagnosis not present

## 2024-01-10 DIAGNOSIS — L089 Local infection of the skin and subcutaneous tissue, unspecified: Secondary | ICD-10-CM | POA: Diagnosis not present

## 2024-01-10 LAB — BASIC METABOLIC PANEL WITH GFR
Anion gap: 8 (ref 5–15)
BUN: 21 mg/dL (ref 8–23)
CO2: 25 mmol/L (ref 22–32)
Calcium: 8.8 mg/dL — ABNORMAL LOW (ref 8.9–10.3)
Chloride: 104 mmol/L (ref 98–111)
Creatinine, Ser: 1.51 mg/dL — ABNORMAL HIGH (ref 0.61–1.24)
GFR, Estimated: 49 mL/min — ABNORMAL LOW (ref >60)
Glucose, Bld: 185 mg/dL — ABNORMAL HIGH (ref 70–99)
Potassium: 4.2 mmol/L (ref 3.5–5.1)
Sodium: 137 mmol/L (ref 135–145)

## 2024-01-10 LAB — GLUCOSE, CAPILLARY
Glucose-Capillary: 246 mg/dL — ABNORMAL HIGH (ref 70–99)
Glucose-Capillary: 320 mg/dL — ABNORMAL HIGH (ref 70–99)
Glucose-Capillary: 178 mg/dL — ABNORMAL HIGH (ref 70–99)
Glucose-Capillary: 126 mg/dL — ABNORMAL HIGH (ref 70–99)

## 2024-01-10 LAB — CBC
HCT: 42 % (ref 39.0–52.0)
Hemoglobin: 14.2 g/dL (ref 13.0–17.0)
MCH: 29.5 pg (ref 26.0–34.0)
MCHC: 33.8 g/dL (ref 30.0–36.0)
MCV: 87.3 fL (ref 80.0–100.0)
Platelets: 169 K/uL (ref 150–400)
RBC: 4.81 MIL/uL (ref 4.22–5.81)
RDW: 13.2 % (ref 11.5–15.5)
WBC: 5.7 K/uL (ref 4.0–10.5)
nRBC: 0 % (ref 0.0–0.2)

## 2024-01-10 MED ORDER — INSULIN ASPART 100 UNIT/ML IJ SOLN
3.0000 [IU] | Freq: Three times a day (TID) | INTRAMUSCULAR | Status: DC
  Administered 2024-01-10 – 2024-01-11 (×2): 3 [IU] via SUBCUTANEOUS
  Filled 2024-01-10 (×2): qty 1

## 2024-01-10 MED ORDER — INSULIN ASPART 100 UNIT/ML IJ SOLN: 0.0000 [IU] | Freq: Every day | INTRAMUSCULAR | Status: DC

## 2024-01-10 MED ORDER — ACETAMINOPHEN 500 MG PO TABS
1000.0000 mg | ORAL_TABLET | Freq: Three times a day (TID) | ORAL | Status: DC
  Administered 2024-01-10 – 2024-01-11 (×2): 1000 mg via ORAL
  Filled 2024-01-10 (×2): qty 2

## 2024-01-10 MED ORDER — INSULIN ASPART 100 UNIT/ML IJ SOLN
0.0000 [IU] | Freq: Three times a day (TID) | INTRAMUSCULAR | Status: DC
  Administered 2024-01-10: 2 [IU] via SUBCUTANEOUS
  Administered 2024-01-11: 5 [IU] via SUBCUTANEOUS
  Filled 2024-01-10 (×2): qty 1

## 2024-01-10 NOTE — Progress Notes (Addendum)
 PROGRESS NOTE   HPI was taken from Dr. Sherre: [name] is a 73 year old male with history of insulin -dependent diabetes mellitus type 1, hypothyroid, who presents ED for chief concerns of left lower extremity redness and pain.   He reports his symptoms and the rash has gotten worse after leaving the hospital.  He endorses compliance with home p.o. antibiotics.   Vitals in the ED showed T of 97.8, respiration rate 19, heart rate 58, blood pressure 124/84, SpO2 98% on room air.   Serum sodium is 135, potassium 4.8, chloride 101, bicarb 20, BUN of 38, serum creatinine 1.83, GFR 39, nonfasting glucose 319,, WBC 8.2, hemoglobin 15.8, platelets of 235.   Lactic acid 1.3.   ED treatment: Zosyn , vancomycin , morphine  4 mg IV one-time dose ---------------------------------- At bedside, patient able to tell me his first and last name, age, location, current calendar year.   He reports the swelling and redness have gotten worse and the pain has gotten worse since he has been discharged from the hospital yesterday.  He endorses compliance with his oral antibiotics.   He reports he has been having intermittent fever and chills.  They did not take a temperature at home.  He denies nausea, vomiting, dysuria, hematuria, diarrhea, blood in his stool, syncope.    Jared Hess  FMW:[id number] DOB: 1950/12/12 DOA: 01/06/2024 PCP: Lenon Layman ORN, MD   Assessment & Plan:   Principal Problem:   Cellulitis Active Problems:   Wound infection   Neuropathy   Dementia (HCC)   Type 1 diabetes mellitus (HCC)   CKD stage 3a, GFR 45-59 ml/min (HCC)   AKI (acute kidney injury) (HCC)   Rash   Hypothyroidism   Enterococcus faecalis infection  Assessment and Plan: Left ankle cellulitis: w/ chronic wound x 3-4 months. Wound cx growing enterococcus faecalis. Continue on linezolid  as per ID. ABI is normal. S/p punch biopsy 01/09/24 as per podiatry. Pathology is pending. Significant pain of left  ankle. Autoimmune/paraneoplastic evaluation pending as per neuro. See neuro's notes. ID and podiatry following and recs apprec   Peripheral neuropathy: continue on home dose of gabapentin    Hypothyroidism: continue on levothyroxine    Rash: right flank, gluteal area & inguinal area. Benadryl  prn    AKI on CKDIIIa: Cr is labile. Avoid nephrotoxic meds    DM1: HbA1c 7.6, poorly controlled. Continue on glargine, SSI w/ accuchecks       DVT prophylaxis: heparin   Code Status: full  Family Communication:  Disposition Plan: likely d/c back home   Level of care: Telemetry Medical  Status is: Inpatient Remains inpatient appropriate because: can d/c when pt is cleared by podiatry, ID & neuro    Consultants:  ID Vasc surg Podiatry  Neuro   Procedures:   Antimicrobials: linezolid    Subjective: Pt c/o left foot pain  Objective: Vitals:   01/09/24 1613 01/09/24 1927 01/10/24 0351 01/10/24 0723  BP: 126/73 117/84 (!) 109/58 (!) 141/95  Pulse: 65 64 62 72  Resp: 16 18 18 18   Temp: 97.7 F (36.5 C) 97.7 F (36.5 C) 97.7 F (36.5 C) 97.7 F (36.5 C)  TempSrc: Oral Oral  Oral  SpO2: 97% 96% 96% 98%  Weight:        Intake/Output Summary (Last 24 hours) at 01/10/2024 0851 Last data filed at 01/09/2024 2241 Gross per 24 hour  Intake 1461.94 ml  Output 0 ml  Net 1461.94 ml   Filed Weights   01/06/24 1301  Weight: 83.2 kg  Examination:  General exam: appears comfortable Respiratory system: clear breath sounds b/l  Cardiovascular system: S1/S2+. No rubs or clicks    Gastrointestinal system: abd is soft, NT, ND & normal bowel sounds Central nervous system: alert & oriented. Moves all extremities  Psychiatry: judgement and insight appears normal. Appropriate mood and affect    Data Reviewed: I have personally reviewed following labs and imaging studies  CBC: Recent Labs  Lab 01/03/24 1605 01/05/24 0427 01/06/24 1307 01/07/24 0609 01/10/24 0246  WBC 6.9 5.4  8.2 5.7 5.7  NEUTROABS 4.0  --  5.0  --   --   HGB 14.6 13.6 15.8 14.2 14.2  HCT 44.5 40.8 47.4 42.7 42.0  MCV 89.0 86.8 86.8 87.1 87.3  PLT 200 185 235 182 169   Basic Metabolic Panel: Recent Labs  Lab 01/05/24 0427 01/06/24 1307 01/07/24 0609 01/08/24 0453 01/09/24 0331 01/10/24 0246  NA 135 135 137 130* 136 137  K 4.9 4.8 4.5 4.8 4.3 4.2  CL 102 101 107 101 103 104  CO2 24 20* 23 23 26 25   GLUCOSE 214* 319* 137* 403* 227* 185*  BUN 24* 38* 33* 28* 22 21  CREATININE 1.57* 1.83* 1.51* 1.62* 1.72* 1.51*  CALCIUM 8.8* 9.5 8.6* 8.4* 8.8* 8.8*  MG 1.8  --   --   --   --   --    GFR: Estimated Creatinine Clearance: 51.5 mL/min (A) (by C-G formula based on SCr of 1.51 mg/dL (H)). Liver Function Tests: Recent Labs  Lab 01/03/24 1605 01/06/24 1307  AST 23 18  ALT 20 16  ALKPHOS 85 92  BILITOT 0.8 0.9  PROT 7.4 6.8  ALBUMIN 4.5 4.0   No results for input(s): LIPASE, AMYLASE in the last 168 hours. No results for input(s): AMMONIA in the last 168 hours. Coagulation Profile: No results for input(s): INR, PROTIME in the last 168 hours. Cardiac Enzymes: No results for input(s): CKTOTAL, CKMB, CKMBINDEX, TROPONINI in the last 168 hours. BNP (last 3 results) No results for input(s): PROBNP in the last 8760 hours. HbA1C: No results for input(s): HGBA1C in the last 72 hours. CBG: Recent Labs  Lab 01/09/24 0741 01/09/24 1127 01/09/24 1652 01/09/24 2121 01/10/24 0720  GLUCAP 216* 273* 180* 144* 246*   Lipid Profile: No results for input(s): CHOL, HDL, LDLCALC, TRIG, CHOLHDL, LDLDIRECT in the last 72 hours. Thyroid Function Tests: No results for input(s): TSH, T4TOTAL, FREET4, T3FREE, THYROIDAB in the last 72 hours. Anemia Panel: No results for input(s): VITAMINB12, FOLATE, FERRITIN, TIBC, IRON, RETICCTPCT in the last 72 hours. Sepsis Labs: Recent Labs  Lab 01/03/24 1605 01/06/24 1307  LATICACIDVEN 0.8 1.3     Recent Results (from the past 240 hours)  Blood culture (routine x 2)     Status: None   Collection Time: 01/04/24  1:21 AM   Specimen: BLOOD  Result Value Ref Range Status   Specimen Description BLOOD LEFT ANTECUBITAL  Final   Special Requests   Final    BOTTLES DRAWN AEROBIC AND ANAEROBIC Blood Culture results may not be optimal due to an excessive volume of blood received in culture bottles   Culture   Final    NO GROWTH 5 DAYS Performed at [facility], [address] [place], [place] [address]    Report Status 01/09/2024 FINAL  Final  Blood culture (routine x 2)     Status: None   Collection Time: 01/04/24  1:21 AM   Specimen: BLOOD  Result Value Ref  Range Status   Specimen Description BLOOD BLOOD LEFT HAND  Final   Special Requests   Final    BOTTLES DRAWN AEROBIC AND ANAEROBIC Blood Culture results may not be optimal due to an excessive volume of blood received in culture bottles   Culture   Final    NO GROWTH 5 DAYS Performed at [facility], [address], [place], [place] [address]    Report Status 01/09/2024 FINAL  Final  Aerobic/Anaerobic Culture w Gram Stain (surgical/deep wound)     Status: None (Preliminary result)   Collection Time: 01/06/24  7:04 PM   Specimen: Wound  Result Value Ref Range Status   Specimen Description   Final    WOUND Performed at [facility], [address] [place], [place] [address]    Special Requests   Final    LL Performed at [facility], [address] [place], [place] [address]    Gram Stain NO WBC SEEN NO ORGANISMS SEEN   Final   Culture   Final    FEW ENTEROCOCCUS FAECALIS NO ANAEROBES ISOLATED; CULTURE IN PROGRESS FOR 5 DAYS SUSCEPTIBILITIES TO FOLLOW Performed at [facility], [address], [place], [place] [address]    Report Status PENDING  Incomplete         Radiology Studies: No results found.       Scheduled Meds:  gabapentin   300 mg Oral TID    heparin   5,000 Units Subcutaneous Q8H   insulin  aspart  0-20 Units Subcutaneous TID WC   insulin  aspart  0-5 Units Subcutaneous QHS   insulin  glargine-yfgn  16 Units Subcutaneous BH-q7a   levothyroxine   112 mcg Oral QAC breakfast   linezolid   600 mg Oral Q12H   Continuous Infusions:     LOS: 4 days       Anthony CHRISTELLA Pouch, MD [facility] Pager [phone]  If 7PM-7AM, please contact night-coverage [email or web address] 01/10/2024, 8:51 AM

## 2024-01-10 NOTE — Plan of Care (Signed)
  Problem: Education: Goal: Ability to describe self-care measures that may prevent or decrease complications (Diabetes Survival Skills Education) will improve Outcome: Progressing Goal: Individualized Educational Video(s) Outcome: Progressing   Problem: Coping: Goal: Ability to adjust to condition or change in health will improve Outcome: Progressing   Problem: Fluid Volume: Goal: Ability to maintain a balanced intake and output will improve Outcome: Progressing   Problem: Health Behavior/Discharge Planning: Goal: Ability to identify and utilize available resources and services will improve Outcome: Progressing Goal: Ability to manage health-related needs will improve Outcome: Progressing   Problem: Metabolic: Goal: Ability to maintain appropriate glucose levels will improve Outcome: Progressing   Problem: Nutritional: Goal: Maintenance of adequate nutrition will improve Outcome: Progressing Goal: Progress toward achieving an optimal weight will improve Outcome: Progressing   Problem: Tissue Perfusion: Goal: Adequacy of tissue perfusion will improve Outcome: Progressing   Problem: Education: Goal: Knowledge of General Education information will improve Description: Including pain rating scale, medication(s)/side effects and non-pharmacologic comfort measures Outcome: Progressing   Problem: Health Behavior/Discharge Planning: Goal: Ability to manage health-related needs will improve Outcome: Progressing   Problem: Clinical Measurements: Goal: Ability to maintain clinical measurements within normal limits will improve Outcome: Progressing Goal: Diagnostic test results will improve Outcome: Progressing Goal: Respiratory complications will improve Outcome: Progressing Goal: Cardiovascular complication will be avoided Outcome: Progressing   Problem: Activity: Goal: Risk for activity intolerance will decrease Outcome: Progressing   Problem: Nutrition: Goal:  Adequate nutrition will be maintained Outcome: Progressing   Problem: Coping: Goal: Level of anxiety will decrease Outcome: Progressing   Problem: Elimination: Goal: Will not experience complications related to bowel motility Outcome: Progressing Goal: Will not experience complications related to urinary retention Outcome: Progressing   Problem: Pain Managment: Goal: General experience of comfort will improve and/or be controlled Outcome: Progressing   Problem: Safety: Goal: Ability to remain free from injury will improve Outcome: Progressing   Problem: Clinical Measurements: Goal: Ability to avoid or minimize complications of infection will improve Outcome: Progressing   Problem: Skin Integrity: Goal: Risk for impaired skin integrity will decrease Outcome: Not Progressing   Problem: Clinical Measurements: Goal: Will remain free from infection Outcome: Not Progressing   Problem: Skin Integrity: Goal: Risk for impaired skin integrity will decrease Outcome: Not Progressing   Problem: Skin Integrity: Goal: Skin integrity will improve Outcome: Not Progressing

## 2024-01-10 NOTE — Evaluation (Signed)
 Physical Therapy Evaluation Patient Details Name: Jared Hess MRN: [id number] DOB: 01-27-1951 Today's Date: 01/10/2024  History of Present Illness  Pt is a 73 y.o. male presenting to hospital 01/06/24 with c/o ankle wound (increasing drainage with pain and swelling to ankle); recently seen in ED 5 days prior and then returned to hospital and admitted 3 days later and discharged home.  Pt now admitted with cellulitis, R sided flank rash, and AKI.  S/p punch biopsy L medial ankle 01/09/24.  PMH includes B carpal tunnel syndrome, chronic B LBP, CKD, dementia, DM, neuropathy, prostate CA, R biceps tenodesis 06/2021, B carpal tunnel release Jan/Feb 2025, L foot sx, lumbar fusion.  Clinical Impression  Prior to recent medical concerns, pt reports being independent with ambulation and active; lives with his wife in 1 level home with 2 STE B railings.  5/10 L ankle burning type pain reporting throughout session (pt reports recent pain medication; pt also reports no change in symptoms/amount of pain throughout sessions activities).  Able to wiggle L toes but 0/10 ankle DF/PF noted (pt reported he was able to perform active DF/PF prior to recent pain/symptoms/medical concerns); L DF to neutral ROM.  Pt (and nursing) reports pt has been up walking independently in his room.  Currently pt is independent with bed mobility; independent with transfers; and SBA ambulating 120 feet (no AD use).  Pt walking on L forefoot d/t reporting L heel tender; pt declined use of assistive device during session but reported he would use axillary crutches if he needed to use something.  Pt would currently benefit from skilled PT to address noted impairments and functional limitations (see below for any additional details).  Upon hospital discharge, no further PT needs anticipated.     If plan is discharge home, recommend the following: Assistance with cooking/housework;Assist for transportation;Help with stairs or ramp for entrance    Can travel by private vehicle    Yes    Equipment Recommendations None recommended by PT  Recommendations for Other Services       Functional Status Assessment Patient has had a recent decline in their functional status and demonstrates the ability to make significant improvements in function in a reasonable and predictable amount of time.     Precautions / Restrictions Precautions Precautions: None Recall of Precautions/Restrictions: Intact Restrictions Weight Bearing Restrictions Per Provider Order: No      Mobility  Bed Mobility Overal bed mobility: Independent             General bed mobility comments: Semi-supine to/from sitting without any noted difficulties.    Transfers Overall transfer level: Independent Equipment used: None               General transfer comment: steady transfer from bed    Ambulation/Gait Ambulation/Gait assistance: Supervision Gait Distance (Feet): 120 Feet Assistive device: None   Gait velocity: mildly decreased     General Gait Details: pt walking on L forefoot (d/t L heel feeling tender); mild decreased stance time L LE; steady ambulation  Stairs            Wheelchair Mobility     Tilt Bed    Modified Rankin (Stroke Patients Only)       Balance Overall balance assessment: Needs assistance Sitting-balance support: No upper extremity supported, Feet supported Sitting balance-Leahy Scale: Normal Sitting balance - Comments: steady reaching outside BOS   Standing balance support: No upper extremity supported, During functional activity Standing balance-Leahy Scale: Normal Standing balance comment: steady ambulation  Pertinent Vitals/Pain Pain Assessment Pain Assessment: 0-10 Pain Score: 5  Pain Location: L ankle Pain Descriptors / Indicators: Constant, Burning Pain Intervention(s): Limited activity within patient's tolerance, Monitored during session,  Premedicated before session, Repositioned (L LE elevated via pillow)    Home Living Family/patient expects to be discharged to:: Private residence Living Arrangements: Spouse/significant other Available Help at Discharge: Family Type of Home: House Home Access: Stairs to enter Entrance Stairs-Rails: Doctor, general practice of Steps: 2   Home Layout: One level Home Equipment: Grab bars - tub/shower;Shower seat - built in;Grab bars - toilet      Prior Function Prior Level of Function : Independent/Modified Independent             Mobility Comments: Independent with ambulation; no recent falls reported; active.       Extremity/Trunk Assessment   Upper Extremity Assessment Upper Extremity Assessment: Overall WFL for tasks assessed    Lower Extremity Assessment Lower Extremity Assessment: LLE deficits/detail LLE Deficits / Details: able to wiggle toes; L DF/PF 0/5 (pt reports he was able to DF/PF prior to current symptoms); at least 3/5 AROM hip flexion and knee flexion/extension LLE: Unable to fully assess due to pain    Cervical / Trunk Assessment Cervical / Trunk Assessment: Normal  Communication   Communication Communication: No apparent difficulties    Cognition Arousal: Alert Behavior During Therapy: WFL for tasks assessed/performed   PT - Cognitive impairments: No apparent impairments                         Following commands: Intact       Cueing Cueing Techniques: Verbal cues     General Comments General comments (skin integrity, edema, etc.): L foot/ankle and lower leg ace wrapped.  Nursing cleared pt for participation in physical therapy.  Pt agreeable to PT session.    Exercises     Assessment/Plan    PT Assessment Patient needs continued PT services  PT Problem List Decreased strength;Decreased range of motion;Decreased activity tolerance;Decreased mobility;Pain       PT Treatment Interventions DME instruction;Gait  training;Stair training;Therapeutic exercise;Patient/family education    PT Goals (Current goals can be found in the Care Plan section)  Acute Rehab PT Goals Patient Stated Goal: to improve L ankle pain and strength PT Goal Formulation: With patient Time For Goal Achievement: 01/24/24 Potential to Achieve Goals: Fair    Frequency Min 1X/week     Co-evaluation               AM-PAC PT 6 Clicks Mobility  Outcome Measure Help needed turning from your back to your side while in a flat bed without using bedrails?: None Help needed moving from lying on your back to sitting on the side of a flat bed without using bedrails?: None Help needed moving to and from a bed to a chair (including a wheelchair)?: None Help needed standing up from a chair using your arms (e.g., wheelchair or bedside chair)?: None Help needed to walk in hospital room?: None Help needed climbing 3-5 steps with a railing? : A Little 6 Click Score: 23    End of Session   Activity Tolerance: Patient tolerated treatment well Patient left: in bed;with call bell/phone within reach;Other (comment) (L LE elevated via pillow) Nurse Communication: Mobility status;Precautions;Weight bearing status PT Visit Diagnosis: Other abnormalities of gait and mobility (R26.89);Pain Pain - Right/Left: Left Pain - part of body: Ankle and joints of foot  Time: 9050-8989 PT Time Calculation (min) (ACUTE ONLY): 21 min   Charges:   PT Evaluation $PT Eval Low Complexity: 1 Low   PT General Charges $$ ACUTE PT VISIT: 1 Visit        Damien Caulk, PT 01/10/24, 11:00 AM

## 2024-01-10 NOTE — Progress Notes (Signed)
 Date of Admission:  01/06/2024     ID: Jared Hess is a 73 y.o. male  Principal Problem:   Cellulitis Active Problems:   Neuropathy   Dementia (HCC)   Type 1 diabetes mellitus (HCC)   Wound infection   CKD stage 3a, GFR 45-59 ml/min (HCC)   AKI (acute kidney injury) (HCC)   Rash   Hypothyroidism   Enterococcus faecalis infection    Subjective: still has lot of pain left ankle Burning sensation over left heel   Medications:   acetaminophen   1,000 mg Oral Q8H   gabapentin   300 mg Oral TID   heparin   5,000 Units Subcutaneous Q8H   insulin  aspart  0-5 Units Subcutaneous QHS   insulin  aspart  0-9 Units Subcutaneous TID WC   insulin  aspart  3 Units Subcutaneous TID WC   insulin  glargine-yfgn  16 Units Subcutaneous BH-q7a   levothyroxine   112 mcg Oral QAC breakfast   linezolid   600 mg Oral Q12H    Objective: Vital signs in last 24 hours: Patient Vitals for the past 24 hrs:  BP Temp Temp src Pulse Resp SpO2  01/10/24 1529 127/76 97.7 F (36.5 C) Oral 73 17 97 %  01/10/24 0723 (!) 141/95 97.7 F (36.5 C) Oral 72 18 98 %  01/10/24 0351 (!) 109/58 97.7 F (36.5 C) -- 62 18 96 %  01/09/24 1927 117/84 97.7 F (36.5 C) Oral 64 18 96 %       PHYSICAL EXAM:  General: Alert, cooperative, no distress, appears stated age.  Head: Normocephalic, without obvious abnormality, atraumatic. Eyes: Conjunctivae clear, anicteric sclerae. Pupils are equal ENT Nares normal. No drainage or sinus tenderness. Lips, mucosa, and tongue normal. No Thrush Neck: Supple, symmetrical, no adenopathy, thyroid: non tender no carotid bruit and no JVD. Back: No CVA tenderness. Lungs: Clear to auscultation bilaterally. No Wheezing or Rhonchi. No rales. Heart: Regular rate and rhythm, no murmur, rub or gallop. Abdomen: Soft, non-tender,not distended. Bowel sounds normal. No masses Extremities: left leg dressing removed- redness, edema much imrpvoed- les s discharge     Skin: No rashes or  lesions. Or bruising Lymph: Cervical, supraclavicular normal. Neurologic: did not assess  Lab Results    Latest Ref Rng & Units 01/10/2024    2:46 AM 01/07/2024    6:09 AM 01/06/2024    1:07 PM  CBC  WBC 4.0 - 10.5 K/uL 5.7  5.7  8.2   Hemoglobin 13.0 - 17.0 g/dL 85.7  85.7  84.1   Hematocrit 39.0 - 52.0 % 42.0  42.7  47.4   Platelets 150 - 400 K/uL 169  182  235        Latest Ref Rng & Units 01/10/2024    2:46 AM 01/09/2024    3:31 AM 01/08/2024    4:53 AM  CMP  Glucose 70 - 99 mg/dL 814  772  596   BUN 8 - 23 mg/dL 21  22  28    Creatinine 0.61 - 1.24 mg/dL 8.48  8.27  8.37   Sodium 135 - 145 mmol/L 137  136  130   Potassium 3.5 - 5.1 mmol/L 4.2  4.3  4.8   Chloride 98 - 111 mmol/L 104  103  101   CO2 22 - 32 mmol/L 25  26  23    Calcium 8.9 - 10.3 mg/dL 8.8  8.8  8.4       Microbiology: WC enterococcus faecalis Studies/Results: No results found.    Assessment/Plan: Chronic left  medial malleolus wound for 3-4 months now  at the site of previous surgical scar HE has taken 2 courses of Doxy 9(April/May) and recently keflex  and bactrim  since 7/20 Cellulitis of the ankle with stiffness and lack of ankle movt  Chronic wound R/o sinus tract and underlying bone/joint infection- but MRI was fine with no evidence of deep infection no joint or bone infection, normal inflammatory markers, N wbc On  linezolid  Wound culture  enterococcus faecalis- can switch to PO amoxiciilin 500mg  TID for 7 days   Venous edema Keep leg elevated- compression wrap       Disproportionate pain, paraesthesia left ankle with frozen ankle R/o Complex regional pain syndrome   ESR/CRP/WBC N   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

## 2024-01-10 NOTE — Progress Notes (Signed)
 Inpatient Diabetes Program Recommendations  AACE/ADA: New Consensus Statement on Inpatient Glycemic Control (2025)  Target Ranges:  Prepandial:   less than 140 mg/dL      Peak postprandial:   less than 180 mg/dL (1-2 hours)      Critically ill patients:  140 - 180 mg/dL   Lab Results  Component Value Date   GLUCAP 320 (H) 01/10/2024   HGBA1C 7.6 (H) 01/04/2024    Review of Glycemic Control  Latest Reference Range & Units 01/09/24 16:52 01/09/24 21:21 01/10/24 07:20 01/10/24 11:45  Glucose-Capillary 70 - 99 mg/dL 819 (H) 855 (H) 753 (H) 320 (H)  Diabetes history: DM 2 Outpatient Diabetes medications:  Amdelog 0-6 units tid with meals Tresiba 20 mg q AM Current orders for Inpatient glycemic control:  Novolog  0-20 units tid with meals and HS Semglee  16 units daily  Inpatient Diabetes Program Recommendations:    Recommend reducing Novolog  correction to sensitive (0-9 units) tid with meals.  Also consider adding Novolog  meal coverage 3 units tid with meals.   Thanks,  Randall Bullocks, RN, BC-ADM Inpatient Diabetes Coordinator Pager [phone]  (8a-5p)

## 2024-01-11 ENCOUNTER — Encounter: Payer: Self-pay | Admitting: Neurosurgery

## 2024-01-11 DIAGNOSIS — L03116 Cellulitis of left lower limb: Secondary | ICD-10-CM | POA: Diagnosis not present

## 2024-01-11 LAB — GLUCOSE, CAPILLARY
Glucose-Capillary: 271 mg/dL — ABNORMAL HIGH (ref 70–99)
Glucose-Capillary: 404 mg/dL — ABNORMAL HIGH (ref 70–99)

## 2024-01-11 LAB — BASIC METABOLIC PANEL WITH GFR
Anion gap: 8 (ref 5–15)
BUN: 22 mg/dL (ref 8–23)
CO2: 24 mmol/L (ref 22–32)
Calcium: 8.7 mg/dL — ABNORMAL LOW (ref 8.9–10.3)
Chloride: 101 mmol/L (ref 98–111)
Creatinine, Ser: 1.43 mg/dL — ABNORMAL HIGH (ref 0.61–1.24)
GFR, Estimated: 52 mL/min — ABNORMAL LOW (ref >60)
Glucose, Bld: 172 mg/dL — ABNORMAL HIGH (ref 70–99)
Potassium: 4.4 mmol/L (ref 3.5–5.1)
Sodium: 133 mmol/L — ABNORMAL LOW (ref 135–145)

## 2024-01-11 LAB — AEROBIC/ANAEROBIC CULTURE W GRAM STAIN (SURGICAL/DEEP WOUND)
Culture: NO GROWTH
Gram Stain: NONE SEEN

## 2024-01-11 LAB — SURGICAL PATHOLOGY

## 2024-01-11 MED ORDER — ACETAMINOPHEN 325 MG PO TABS: 650.0000 mg | ORAL_TABLET | Freq: Four times a day (QID) | ORAL | Status: AC | PRN

## 2024-01-11 MED ORDER — AMOXICILLIN 500 MG PO CAPS
500.0000 mg | ORAL_CAPSULE | Freq: Three times a day (TID) | ORAL | 0 refills | Status: AC
Start: 2024-01-11 — End: 2024-01-18
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
## 2024-01-11 NOTE — Progress Notes (Signed)
 PODIATRY / FOOT AND ANKLE SURGERY PROGRESS NOTE  Requesting Physician: Dr. Willo  Reason for consult: Left medial ankle wound/cellulitis   HPI: Jared Hess is a 73 y.o. male who presents today resting in bed eating lunch.  He still notes he has pain with burning in his left foot.  He is has dressing changes left foot since he was seen last.  PMHx:  Past Medical History:  Diagnosis Date   Bilateral carpal tunnel syndrome    Chronic bilateral low back pain without sciatica    CKD (chronic kidney disease) stage 3, GFR 30-59 ml/min (HCC)    Dementia (HCC)    DM (diabetes mellitus), type 1 (HCC)    ED (erectile dysfunction)    Hypothyroidism    Neuropathy    Prostate cancer (HCC)    Thyroid disease     Surgical Hx:  Past Surgical History:  Procedure Laterality Date   BICEPT TENODESIS Right 07/14/2021   Procedure: BICEPS TENODESIS;  Surgeon: Marchia Drivers, MD;  Location: [facility] ORS;  Service: Orthopedics;  Laterality: Right;   CARPAL TUNNEL RELEASE Right 07/12/2023   Procedure: RIGHT CARPAL TUNNEL RELEASE WITH ULTRASOUND GUIDANCE;  Surgeon: Claudene Penne ORN, MD;  Location: [facility] ORS;  Service: Neurosurgery;  Laterality: Right;   CARPAL TUNNEL RELEASE Left 08/09/2023   Procedure: LEFT CARPAL TUNNEL RELEASE WITH ULTRASOUND GUIDANCE;  Surgeon: Claudene Penne ORN, MD;  Location: [facility] ORS;  Service: Neurosurgery;  Laterality: Left;   FOOT SURGERY Left    LUMBAR FUSION     PROSTATE CRYOABLATION  2010   SHOULDER ARTHROSCOPY WITH OPEN ROTATOR CUFF REPAIR AND DISTAL CLAVICLE ACROMINECTOMY Right 07/14/2021   Procedure: SHOULDER ARTHROSCOPY WITH OPEN ROTATOR CUFF REPAIR AND DISTAL CLAVICLE ACROMINECTOMY;  Surgeon: Marchia Drivers, MD;  Location: [facility] ORS;  Service: Orthopedics;  Laterality: Right;   shoulder blade surgery  2002    FHx:  Family History  Problem Relation Age of Onset   Diabetes Sister    Prostate cancer Neg Hx    Bladder Cancer Neg Hx    Kidney cancer Neg Hx      Social History:  reports that he has never smoked. He has never used smokeless tobacco. He reports that he does not currently use alcohol. He reports that he does not use drugs.  Allergies:  Allergies  Allergen Reactions   Armoracia Rusticana Ext (Horseradish) Itching, Nausea And Vomiting, Nausea Only and Swelling   Tree Extract Itching, Anaphylaxis and Shortness Of Breath    Walnuts-Throat closes up   [facility] Anaphylaxis, Shortness Of Breath, Itching, Nausea And Vomiting, Nausea Only and Swelling   Bactrim  [Sulfamethoxazole -Trimethoprim ] Rash    Occurred July 2025    Medications Prior to Admission  Medication Sig Dispense Refill   cephALEXin  (KEFLEX ) 500 MG capsule Take 1 capsule (500 mg total) by mouth 4 (four) times daily for 10 days. 40 capsule 0   Cyanocobalamin (VITAMIN B-12) 2500 MCG SUBL Place 2,500 mcg under the tongue in the morning.     HYDROcodone -acetaminophen  (NORCO/VICODIN) 5-325 MG tablet TAKE 1 TABLET BY MOUTH EVERY 4 HOURS AS NEEDED FOR UP TO 7 DAYS FOR MODERATE PAIN (PAIN SCORE 4-6)     insulin  lispro (ADMELOG SOLOSTAR) 100 UNIT/ML KwikPen Inject 0-6 Units into the skin with breakfast, with lunch, and with evening meal. Sliding scale     Insulin  Pen Needle (BD PEN NEEDLE NANO ULTRAFINE) 32G X 4 MM MISC 1 each by Other route.  Use 1 each 4 (four) times daily  LANTUS  SOLOSTAR 100 UNIT/ML Solostar Pen INJECT 20 UNITS SUBCUTANEOUSLY AT BEDTIME     levothyroxine  (SYNTHROID ) 112 MCG tablet Take 112 mcg by mouth daily before breakfast.     sulfamethoxazole -trimethoprim  (BACTRIM  DS) 800-160 MG tablet Take 1 tablet by mouth 2 (two) times daily for 10 days. 20 tablet 0   CREON 24000-76000 units CPEP Take 2 capsules by mouth 3 (three) times daily before meals. (Patient not taking: Reported on 11/01/2023)     gabapentin  (NEURONTIN ) 300 MG capsule Take 1 capsule (300 mg total) by mouth at bedtime. Please take once at night for one week. May increase to twice a day if tolerating a  day (Patient not taking: Reported on 11/01/2023) 90 capsule 2   TRESIBA FLEXTOUCH 100 UNIT/ML FlexTouch Pen Inject 20 Units into the skin every morning. (Patient not taking: Reported on 01/06/2024)      Physical Exam: General: Alert and oriented.  No apparent distress.  Vascular: DP/PT pulses faintly palpable bilateral, capillary fill time appears to be intact to digits bilaterally, somewhat slight violaceous appearance present to bilateral lower extremities, spider veins present.  Mild bilateral lower extremity nonpitting edema.  Neuro: Tinel's sign positive left tibial nerve distribution which appears to be hypersensitive.  Derm: Punch biopsy site appears to be healing well at this time to the left medial ankle, no other openings present today.  MSK: Still appears to be hypersensitive on any light touch sensation to the left foot or ankle but overall less sensitive to the plantar foot but still appears to be about the medial ankle going all the way up to the leg and knee.  Results for orders placed or performed during the hospital encounter of 01/06/24 (from the past 48 hours)  Glucose, capillary     Status: Abnormal   Collection Time: 01/09/24  4:52 PM  Result Value Ref Range   Glucose-Capillary 180 (H) 70 - 99 mg/dL    Comment: Glucose reference range applies only to samples taken after fasting for at least 8 hours.  Glucose, capillary     Status: Abnormal   Collection Time: 01/09/24  9:21 PM  Result Value Ref Range   Glucose-Capillary 144 (H) 70 - 99 mg/dL    Comment: Glucose reference range applies only to samples taken after fasting for at least 8 hours.   Comment 1 Notify RN   Basic metabolic panel with GFR     Status: Abnormal   Collection Time: 01/10/24  2:46 AM  Result Value Ref Range   Sodium 137 135 - 145 mmol/L   Potassium 4.2 3.5 - 5.1 mmol/L   Chloride 104 98 - 111 mmol/L   CO2 25 22 - 32 mmol/L   Glucose, Bld 185 (H) 70 - 99 mg/dL    Comment: Glucose reference range  applies only to samples taken after fasting for at least 8 hours.   BUN 21 8 - 23 mg/dL   Creatinine, Ser 8.48 (H) 0.61 - 1.24 mg/dL   Calcium 8.8 (L) 8.9 - 10.3 mg/dL   GFR, Estimated 49 (L) >60 mL/min    Comment: (NOTE) Calculated using the CKD-EPI Creatinine Equation (2021)    Anion gap 8 5 - 15    Comment: Performed at [facility], [address] [place], [place] [address]  CBC     Status: None   Collection Time: 01/10/24  2:46 AM  Result Value Ref Range   WBC 5.7 4.0 - 10.5 K/uL   RBC 4.81 4.22 - 5.81 MIL/uL  Hemoglobin 14.2 13.0 - 17.0 g/dL   HCT 57.9 60.9 - 47.9 %   MCV 87.3 80.0 - 100.0 fL   MCH 29.5 26.0 - 34.0 pg   MCHC 33.8 30.0 - 36.0 g/dL   RDW 86.7 88.4 - 84.4 %   Platelets 169 150 - 400 K/uL   nRBC 0.0 0.0 - 0.2 %    Comment: Performed at [facility], [address] [place], [place] [address]  Glucose, capillary     Status: Abnormal   Collection Time: 01/10/24  7:20 AM  Result Value Ref Range   Glucose-Capillary 246 (H) 70 - 99 mg/dL    Comment: Glucose reference range applies only to samples taken after fasting for at least 8 hours.  Glucose, capillary     Status: Abnormal   Collection Time: 01/10/24 11:45 AM  Result Value Ref Range   Glucose-Capillary 320 (H) 70 - 99 mg/dL    Comment: Glucose reference range applies only to samples taken after fasting for at least 8 hours.  Glucose, capillary     Status: Abnormal   Collection Time: 01/10/24  4:04 PM  Result Value Ref Range   Glucose-Capillary 178 (H) 70 - 99 mg/dL    Comment: Glucose reference range applies only to samples taken after fasting for at least 8 hours.  Glucose, capillary     Status: Abnormal   Collection Time: 01/10/24  9:51 PM  Result Value Ref Range   Glucose-Capillary 126 (H) 70 - 99 mg/dL    Comment: Glucose reference range applies only to samples taken after fasting for at least 8 hours.  Basic metabolic panel     Status: Abnormal   Collection Time: 01/11/24   4:01 AM  Result Value Ref Range   Sodium 133 (L) 135 - 145 mmol/L   Potassium 4.4 3.5 - 5.1 mmol/L   Chloride 101 98 - 111 mmol/L   CO2 24 22 - 32 mmol/L   Glucose, Bld 172 (H) 70 - 99 mg/dL    Comment: Glucose reference range applies only to samples taken after fasting for at least 8 hours.   BUN 22 8 - 23 mg/dL   Creatinine, Ser 8.56 (H) 0.61 - 1.24 mg/dL   Calcium 8.7 (L) 8.9 - 10.3 mg/dL   GFR, Estimated 52 (L) >60 mL/min    Comment: (NOTE) Calculated using the CKD-EPI Creatinine Equation (2021)    Anion gap 8 5 - 15    Comment: Performed at [facility], [address] [place], [place] [address]  Glucose, capillary     Status: Abnormal   Collection Time: 01/11/24  7:31 AM  Result Value Ref Range   Glucose-Capillary 271 (H) 70 - 99 mg/dL    Comment: Glucose reference range applies only to samples taken after fasting for at least 8 hours.  Glucose, capillary     Status: Abnormal   Collection Time: 01/11/24 11:45 AM  Result Value Ref Range   Glucose-Capillary 404 (H) 70 - 99 mg/dL    Comment: Glucose reference range applies only to samples taken after fasting for at least 8 hours.   No results found.   Blood pressure 124/81, pulse 60, temperature 98 F (36.7 C), resp. rate 17, weight 83.2 kg, SpO2 96%.  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  No signs for deeper infection based on MRI. - Clinically erythema and edema appear to be reduced almost within normal limits.  No current openings present to left medial ankle. - Recommend applying antibiotic ointment to the punch biopsy site and abrasion to the heel followed by large Band-Aid.  Recommend changing  daily.  Patient is to then use compression stockings with 15 to 20 mmHg compression.  If does not have them would recommend usage of Ace wrap as applied previously.  Will call patient with biopsy result. - Overall signs of infection appear to be greatly reduced.  Wound culture negative for bacterial growth, CRP and ESR within normal limits, white blood cell count within normal limits.  Reduction in erythema and edema present to left lower extremity overall.  Appreciate infectious disease recommendations for antibiotic therapy if needed further. - Discussed CRPS.  Patient to follow-up with outpatient neurology specialist, Dr. Maree.  Will require fairly aggressive physical therapy and pain management.  Podiatry team to sign off at this time.  Prentice Lee, DPM 01/11/2024, 1:34 PM

## 2024-01-11 NOTE — Plan of Care (Signed)
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
## 2024-01-11 NOTE — Progress Notes (Signed)
D/C AVS completed and reviewed with pt. All opportunities for questions answered and clarified. IV removed. Pt will be wheeled down to car at medical mall entrance via wheelchair.

## 2024-01-11 NOTE — Discharge Instructions (Signed)
 Recommend dressing changes every day to your left ankle consisting of applying bacitracin or antibiotic ointment to the areas of the punch biopsy and abrasion to the left heel, cover with large Band-Aid.  Recommend usage of compression stockings with 15 to 20 mmHg of compression that goes from the toes to below knee.  This can be obtained over-the-counter.  If you do not have this would recommend usage of Ace wrap with moderate compression.

## 2024-01-11 NOTE — Progress Notes (Addendum)
 Physical Therapy Treatment Patient Details Name: Jared Hess MRN: [id number] DOB: 10-21-1950 Today's Date: 01/11/2024   History of Present Illness Pt is a 73 y.o. male presenting to hospital 01/06/24 with c/o ankle wound (increasing drainage with pain and swelling to ankle); recently seen in ED 5 days prior and then returned to hospital and admitted 3 days later and discharged home.  Pt now admitted with cellulitis, R sided flank rash, and AKI.  S/p punch biopsy L medial ankle 01/09/24.  PMH includes B carpal tunnel syndrome, chronic B LBP, CKD, dementia, DM, neuropathy, prostate CA, R biceps tenodesis 06/2021, B carpal tunnel release Jan/Feb 2025, L foot sx, lumbar fusion.    PT Comments  Pt was supine in bed upon arrival. He is A and O x 4. Remains cooperative throughout session.  I would rather they amputate my leg than be on pain medications for the rest of my life. Pt does endorse L heel pain however pain did not limit session progression. Pt was easily and safely able to exit bed, stand, and ambulate without assistive device. Does toe walk on LLE however once back in room he was able to tolerate AAROM LLE dorsiflexion stretching well.  He perform stairs with supervision only. Acute PT will sign off. NO follow up PT recommended.    If plan is discharge home, recommend the following: Other (comment) (none)     Equipment Recommendations  None recommended by PT       Precautions / Restrictions Precautions Precautions: None Recall of Precautions/Restrictions: Intact Restrictions Weight Bearing Restrictions Per Provider Order: No     Mobility  Bed Mobility Overal bed mobility: Independent   Transfers Overall transfer level: Independent Equipment used: None   Ambulation/Gait Ambulation/Gait assistance: Modified independent (Device/Increase time) Gait Distance (Feet): 120 Feet Assistive device: None Gait Pattern/deviations: Step-through pattern Gait velocity: decreased   General Gait Details: Pt tends to walk on LLE/toes due to pain with heel wt bearing.   Stairs Stairs: Yes Stairs assistance: Supervision Stair Management: One rail Left, Forwards Number of Stairs: 4 General stair comments: No LOB or difficulty with stairs to simulate home entry    Balance Overall balance assessment: Modified Independent      Communication Communication Communication: No apparent difficulties  Cognition Arousal: Alert Behavior During Therapy: WFL for tasks assessed/performed   PT - Cognitive impairments: No apparent impairments      Following commands: Intact      Cueing Cueing Techniques: Verbal cues     General Comments General comments (skin integrity, edema, etc.): Author issued pt a gait belt and prescribed HEP exercises to promote strengthening and increased L ankle ROM. pt demonstrated performance I'ly      Pertinent Vitals/Pain Pain Assessment Pain Assessment: 0-10 Pain Score: 6  Pain Location: L ankle Pain Descriptors / Indicators: Constant, Burning Pain Intervention(s): Limited activity within patient's tolerance, Monitored during session, Premedicated before session, Repositioned     PT Goals (current goals can now be found in the care plan section) Acute Rehab PT Goals Patient Stated Goal: to return to work Progress towards PT goals: Goals met/education completed, patient discharged from PT    Frequency    Min 1X/week       AM-PAC PT 6 Clicks Mobility   Outcome Measure  Help needed turning from your back to your side while in a flat bed without using bedrails?: None Help needed moving from lying on your back to sitting on the side of a flat bed without using bedrails?:  None Help needed moving to and from a bed to a chair (including a wheelchair)?: None Help needed standing up from a chair using your arms (e.g., wheelchair or bedside chair)?: None Help needed to walk in hospital room?: None Help needed climbing 3-5 steps with  a railing? : None 6 Click Score: 24    End of Session Equipment Utilized During Treatment: Gait belt Activity Tolerance: Patient tolerated treatment well Patient left: in bed;with call bell/phone within reach;Other (comment) Nurse Communication: Mobility status;Precautions;Weight bearing status PT Visit Diagnosis: Other abnormalities of gait and mobility (R26.89);Pain Pain - Right/Left: Left Pain - part of body: Ankle and joints of foot     Time: 9159-9147 PT Time Calculation (min) (ACUTE ONLY): 12 min  Charges:    $Gait Training: 8-22 mins PT General Charges $$ ACUTE PT VISIT: 1 Visit                    Rankin Essex PTA 01/11/24, 9:18 AM

## 2024-01-12 ENCOUNTER — Other Ambulatory Visit

## 2024-01-18 NOTE — Progress Notes (Signed)
 Ref Provider: Dr. Arch PCP: Dr. Lenon, Layman Blush III, MD Assessment and Plan:   In most patients we give written parts of assessment and plan to patient under Patient Instructions/After Visit Summary. So some parts are directed to patient.  Dear Mr. Antwoine Zorn, It was our pleasure to participate in your care via  in person. We have typed up brief summary of what we discussed.  Assessment & Plan Chronic left foot wound with recent infection and diabetic neuropathy Chronic left foot wound with recent E. coli infection, initially resistant to antibiotics, necessitating hospitalization and IV antibiotics. The infection has resolved, but residual swelling and neuropathic pain persist, likely due to diabetic neuropathy and possible nerve infection. Concerns about potential underlying neurological issues due to previous neurectomy and possible microtracks in the scar tissue. Was always in pain, but got a burning sensation in heel that went up his leg. Was in severe pain until 2 days ago, and the pain was totally gone on Tuesday.  ED visit 01/06/24 for infection in left foot. He has a wound that keeps opening and will not heal.  - Encourage good hygiene and infection prevention measures. - Advise wearing compression socks and performing gentle massage to reduce swelling. - Recommend elevating the foot to reduce swelling. - Advise on maintaining good blood sugar control to prevent future infections. - Discuss the importance of a healthy diet and lifestyle to support immune function. Initial concern for complex regional pain syndrome but his symptoms have resolved.  - Recommends doing very gentle massages starting at foot and slowly working up. - For now, be very careful, wash legs 1-2x a day. Improve immune system with 7-8 hours a day, and daily sunlight exposure. - Try to get vitamin C through foods instead of supplements (oranges, limes, lemons) zinc (nuts and seeds)   2. 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 Neuropathy likely exacerbated by recent infection, causing severe pain that has now resolved.  - Advise on maintaining good blood sugar control. - Recommend alpha lipoic acid supplementation for neuropathy. - Discuss the importance of a healthy diet and lifestyle to support immune function.  3. Peripheral vascular disease with venous stasis changes Peripheral vascular disease with venous stasis changes contributing to chronic swelling and potential for wound development. Venous stasis changes confirmed by biopsy showing keratosis with spongiosis.  - Advise wearing compression socks and performing gentle massage to reduce swelling. - Recommend elevating the foot to reduce swelling. - Encourage physical activity to promote circulation.  4. Late onset mild Alzheimer's Disease drop in Vit B12 compared to past, type I diabetes diagnosed in 60's, down beating nystagmus with negative MRI Brain with and without contrast in 07/2019 - need to rule out autoimmune/paraneoplastic cognitive impairments. Was tried on donepezil, discontinued as symptoms did not improve. 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 Vitamin B12 deficiency, on supplementation Vitamin B12 deficiency, currently managed with supplementation.  6. 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 Hands have worsened and the numbness is now radiating from his hands into his arms  Late-onset Marlin Summer's disease, contributing to muscle loss in the hands.  CONSIDERED COMORBIDITIES  BELOW Sensation of disequilibrium and recent fall  Follow up in 6-8 months with Allyson Stallion, FNP-BC  Return in about 6 months (around 07/21/2024) for Allyson Broody NP.  This note has been created using automated tools and reviewed for accuracy by [facility]. I spent a total of 43 minutes in both face-to-face and non-face-to-face activities, excluding procedures performed, for this visit on the date of this encounter.  Interim History date 01/19/2024   Mr. Riviera is a 73 y.o. male here for treatment and evaluation of an infection on his foot.  Mr. Weyer last visit was on 05/05/2023  ED visit 01/06/24 for infection in left foot. He has a wound that keeps opening and will not heal.  History of Present Illness Alvie Fowles is a 73 year old male with late onset Marlin summer's disease and type one diabetes who presents with concerns about a recurrent left foot infection and neuropathic pain. He was referred by Dr. Lennie for evaluation of his recurrent foot infection and potential neurological issues.  Left foot infection - Recurrent infection in the left foot since April - Multiple courses of antibiotics with persistent infection - Hospitalized for intravenous antibiotics after emergency room visit - Cultures identified E. coli faecalis, resistant to initial treatments - Infection began to heal after change in antibiotics - Swelling in the left foot, managed with compression socks  Neuropathic pain and sensory loss - Severe burning pain in the left heel radiating up the leg, previously exacerbated by nerve block procedure - Pain persisted until two days ago, then suddenly resolved - No current pain in the left foot - No sensation in the left foot due to prior neurectomy - No issues with ambulation despite lack of sensation  Peripheral neuropathy and motor dysfunction - Significant muscle loss in bilateral hands - Carpal tunnel syndrome - Peripheral neuropathy contributing  to balance issues  History of neurectomy and foot trauma - Neurectomy in the left foot following traumatic injury 50 years ago - Occasional slight opening of the scar from prior neurectomy  Cognitive impairment and genetic risk - Started on donepezil for dementia - Genetic testing revealed APOE alleles E3 and E4 - Normal MRI of the brain - Normal amyloid and neurofilament protein levels  Endocrine and nutritional status - Type 1 diabetes managed with insulin  - History of vitamin B12 deficiency - Currently taking Synthroid , insulin , B12, D, C, folic acid, zinc, and recently started alpha lipoic acid  Dietary habits and physical activity - Follows a Mediterranean diet, avoids red meat due to positive alpha-gal test - Diet focuses on [place] and seafood - Physically active  I reviewed labs, imaging, and notes in [employer], [place], and from outside providers, if available.   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 Arch.   Late onset mild Alzheimer's Disease drop in Vit B12 compared to past, type I diabetes diagnosed in 60's, down beating nystagmus with negative MRI Brain with and without contrast in 07/2019 - need to rule out autoimmune/paraneoplastic cognitive impairments. Was tried on donepezil, discontinued as symptoms did not improve. Per 03/2023 labs, patient  has Elevated p-tau 217. 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 He is having short-term memory, he had key to the car, 8 hours later he forgot where hidden and he  forgets conversations in the same day. His symptoms have been going on since December 2020 and they came on gradually. He denies any significant head trauma, family history of dementia, snoring, and history of ADD/ADHD. He does have hearing impairment, he is not taking any sleeping pills, but started a new medication (Prednisone  and Antibiotics). He does not consume alcohol and he is going through a stressful situation with moving, spider bite, and business. He states his memory does not hinder his daily functions, feelings of boredom, low motivation, apathy, or visual hallucinations. He has had blood work completed, but he has not had any brian scans, nor is he involved in any legal matters.   Patient states December 2020 he was bit in the back of his neck and was bit by hobo spider on right shin in July 2021 and since the second bite he has been having cognitive issues especially with numbers. They state after first bite in January he began to repeat conversations and questions.   Patient denies depression or anxiety.   SLUMS 02/20/20: 25/30  MRI brain with and without contrast 03/09/2020: Normal MRI of the brain  Medications: Donepezil (ineffective)  Sensation of disequilibrium and recent fall: No issues noted in flocculo nodular lobe or lower brainstem on his previous MRI. We recommend following back up with neuro ophthalmologist. Discussed vestibular rehabilitation, patient defers at this time. Injured shoulder in fall.  MRI Right Shoulder without contrast 06/06/2021: 1. Full-thickness, full-width tear of the supraspinatus tendon in the region of the critical zone with mild retraction resulting in up to a 1.3 cm tendon gap. 2. Mild-moderate infraspinatus and subscapularis tendinosis without tear. 3. Severe tendinosis of the long head biceps tendon with mild tenosynovitis. 4. Moderate AC and mild glenohumeral osteoarthritis.   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 coli infection, initially resistant to antibiotics, necessitating hospitalization and IV antibiotics. The infection has resolved, but residual swelling and neuropathic pain persist, likely due to diabetic neuropathy and possible nerve infection. Concerns about potential underlying neurological issues due to previous neurectomy and possible microtracks in the scar tissue. Was always in pain, but got a burning sensation in heel that went up his leg. Was in severe pain until 2 days ago, and the pain was totally gone on Tuesday.  ED visit 01/06/24 for infection in left foot. He has a wound that keeps opening and will not heal.  Peripheral vascular disease with venous stasis changes Peripheral vascular disease with venous stasis changes contributing to chronic swelling and potential for wound development. Venous stasis changes confirmed by biopsy showing keratosis with spongiosis.  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 Hands have worsened and the numbness is now radiating from his hands into his arms  Late-onset Marlin Summer's disease, contributing to muscle loss in the hands.  Physical Exam   Vitals Vitals:   01/19/24 0813  BP: 122/74  Pulse: 75  SpO2: 96%  Weight:  84.8 kg (187 lb)  Height: 185.4 cm (6' 1)  PainSc: 0-No pain    Body mass index is 24.67 kg/m.  (Some of the exam changes noted are from previous clinical observations)  Physical Exam   General Exam  Neurological Exam Down beating nystagmus  Taps near ankle on front side, and feels pain shooting up through the knee. Taps on the back side of the ankle  and feels it through the back of his knee.  04/07/2023 Lose of muscle mass in bilateral hands   Medications: Current Outpatient Medications on File Prior to Visit  Medication Sig Dispense Refill  . ADMELOG SOLOSTAR U-100 INSULIN  pen injector (concentration 100 units/mL) Inject 10 Units subcutaneously 3 (three) times daily with meals 30 mL 2  . blood-glucose sensor (DEXCOM G7 SENSOR) Devi Use 1 each every 10 (ten) days 9 each 3  . insulin  GLARGINE (LANTUS  SOLOSTAR U-100 INSULIN ) pen injector (concentration 100 units/mL) Inject 20 Units subcutaneously at bedtime - Subcutaneous 18 mL 4  . levothyroxine  (SYNTHROID ) 112 MCG tablet Take 1 tablet (112 mcg total) by mouth once daily Take on an empty stomach with a glass of water at least 30-60 minutes before breakfast. 90 tablet 3  . CEQUR SIMPLICITY INSERTER Misc Use 1 each once for 1 dose For use to apply CeQur patch. (Patient not taking: Reported on 01/19/2024) 1 each 0  . pen needle, diabetic (BD ULTRA-FINE NANO PEN NEEDLE) 32 gauge x 5/32 Ndle Use 1 each 4 (four) times daily 360 each 3   No current facility-administered medications on file prior to visit.   Past Medical History:  Past Medical History:  Diagnosis Date  . Diabetes mellitus without complication (CMS/HHS-HCC)   . Prostate cancer (CMS/HHS-HCC) 2011    Past Surgical History:  Past Surgical History:  Procedure Laterality Date  . left heel surgery Left 1994   pins placed and then removed later, left ankle crushed from log rolling over his leg  . ARTHROSCOPIC ROTATOR CUFF REPAIR  2023  . PROSTATE SURGERY    . SPINE SURGERY  ? 1972   lumbar 2 and 3 disks removed that were broken, rowing accident   Family History:  Family History  Problem Relation Name Age of Onset  . Rheum arthritis Mother    . No Known Problems Father    . Diabetes Sister    . No Known Problems Brother    . No Known Problems Sister    . No Known Problems Sister    . No Known Problems Brother     Social  History:  Social History   Socioeconomic History  . Marital status: Married  Tobacco Use  . Smoking status: Never    Passive exposure: Never  . Smokeless tobacco: Never  Substance and Sexual Activity  . Alcohol use: Yes    Comment: occ  . Drug use: Not Currently   Social Drivers of Health   Financial Resource Strain: Low Risk  (02/22/2023)   Overall Financial Resource Strain (CARDIA)   . Difficulty of Paying Living Expenses: Not hard at all  Food Insecurity: No Food Insecurity (01/06/2024)   [facility]   Hunger Vital Sign   . Within the past 12 months, you worried that your food would run out before you got the money to buy more.: Never true   . Within the past 12 months, the food you bought just didn't last and you didn't have money to get more.: Never true  Transportation Needs:  No Transportation Needs (01/06/2024)   Received from [facility] - Transportation   . In the past 12 months, has lack of transportation kept you from medical appointments or from getting medications?: No   . In the past 12 months, has lack of transportation kept you from meetings, work, or from getting things needed for daily living?: No  Social Connections: Unknown (01/06/2024)   [facility]   Social Connection and Isolation Panel   . In a typical week, how many times do you talk on the phone with family, friends, or neighbors?: More than three times a week   . How often do you get together with friends or relatives?: More than three times a week   . How often do you attend church or religious services?: Never   . Do you belong to any clubs or organizations such as church groups, unions, fraternal or athletic groups, or school groups?: Patient declined   . How often do you attend meetings of the clubs or organizations you belong to?: Patient declined   . Are you married, widowed, divorced, separated, never married, or living with a partner?: Married  Housing  Stability: Unknown (11/01/2023)   Housing Stability Vital Sign   . Unable to Pay for Housing in the Last Year: No   . Homeless in the Last Year: No   Allergies:  Allergies  Allergen Reactions  . Horseradish Itching and Swelling  . Tree Nuts Itching, Other (See Comments) and Shortness Of Breath    Walnuts-Throat closes up  . Tomato Other (See Comments)  . Sulfamethoxazole -Trimethoprim  Rash    Occurred July 2025   Dr. Jannett Fairly

## 2024-01-21 ENCOUNTER — Emergency Department

## 2024-01-21 ENCOUNTER — Inpatient Hospital Stay
Admission: EM | Admit: 2024-01-21 | Discharge: 2024-01-25 | DRG: 372 | Disposition: A | Discharge status: Home / Self Care | Attending: Internal Medicine | Admitting: Internal Medicine

## 2024-01-21 ENCOUNTER — Other Ambulatory Visit: Payer: Self-pay

## 2024-01-21 DIAGNOSIS — M25572 Pain in left ankle and joints of left foot: Secondary | ICD-10-CM | POA: Diagnosis present

## 2024-01-21 DIAGNOSIS — I959 Hypotension, unspecified: Secondary | ICD-10-CM | POA: Diagnosis present

## 2024-01-21 DIAGNOSIS — M4802 Spinal stenosis, cervical region: Secondary | ICD-10-CM | POA: Diagnosis present

## 2024-01-21 DIAGNOSIS — R442 Other hallucinations: Secondary | ICD-10-CM | POA: Diagnosis present

## 2024-01-21 DIAGNOSIS — Z79890 Hormone replacement therapy (postmenopausal): Secondary | ICD-10-CM | POA: Diagnosis not present

## 2024-01-21 DIAGNOSIS — M47812 Spondylosis without myelopathy or radiculopathy, cervical region: Secondary | ICD-10-CM | POA: Diagnosis present

## 2024-01-21 DIAGNOSIS — R937 Abnormal findings on diagnostic imaging of other parts of musculoskeletal system: Secondary | ICD-10-CM | POA: Diagnosis not present

## 2024-01-21 DIAGNOSIS — Z8546 Personal history of malignant neoplasm of prostate: Secondary | ICD-10-CM | POA: Diagnosis not present

## 2024-01-21 DIAGNOSIS — N1831 Chronic kidney disease, stage 3a: Secondary | ICD-10-CM | POA: Diagnosis present

## 2024-01-21 DIAGNOSIS — E039 Hypothyroidism, unspecified: Secondary | ICD-10-CM | POA: Diagnosis present

## 2024-01-21 DIAGNOSIS — I452 Bifascicular block: Secondary | ICD-10-CM | POA: Diagnosis present

## 2024-01-21 DIAGNOSIS — A0472 Enterocolitis due to Clostridium difficile, not specified as recurrent: Principal | ICD-10-CM | POA: Diagnosis present

## 2024-01-21 DIAGNOSIS — R569 Unspecified convulsions: Secondary | ICD-10-CM | POA: Diagnosis not present

## 2024-01-21 DIAGNOSIS — E1069 Type 1 diabetes mellitus with other specified complication: Secondary | ICD-10-CM | POA: Diagnosis not present

## 2024-01-21 DIAGNOSIS — E1042 Type 1 diabetes mellitus with diabetic polyneuropathy: Secondary | ICD-10-CM | POA: Diagnosis present

## 2024-01-21 DIAGNOSIS — E1022 Type 1 diabetes mellitus with diabetic chronic kidney disease: Secondary | ICD-10-CM | POA: Diagnosis present

## 2024-01-21 DIAGNOSIS — E109 Type 1 diabetes mellitus without complications: Secondary | ICD-10-CM | POA: Diagnosis present

## 2024-01-21 DIAGNOSIS — R52 Pain, unspecified: Secondary | ICD-10-CM | POA: Diagnosis not present

## 2024-01-21 DIAGNOSIS — Z91018 Allergy to other foods: Secondary | ICD-10-CM | POA: Diagnosis not present

## 2024-01-21 DIAGNOSIS — Z79899 Other long term (current) drug therapy: Secondary | ICD-10-CM | POA: Diagnosis not present

## 2024-01-21 DIAGNOSIS — L03116 Cellulitis of left lower limb: Secondary | ICD-10-CM

## 2024-01-21 DIAGNOSIS — Z881 Allergy status to other antibiotic agents status: Secondary | ICD-10-CM

## 2024-01-21 DIAGNOSIS — Z833 Family history of diabetes mellitus: Secondary | ICD-10-CM

## 2024-01-21 DIAGNOSIS — R253 Fasciculation: Secondary | ICD-10-CM | POA: Diagnosis present

## 2024-01-21 DIAGNOSIS — B9689 Other specified bacterial agents as the cause of diseases classified elsewhere: Secondary | ICD-10-CM | POA: Diagnosis not present

## 2024-01-21 DIAGNOSIS — R443 Hallucinations, unspecified: Secondary | ICD-10-CM | POA: Diagnosis not present

## 2024-01-21 DIAGNOSIS — F0392 Unspecified dementia, unspecified severity, with psychotic disturbance: Secondary | ICD-10-CM | POA: Diagnosis present

## 2024-01-21 DIAGNOSIS — E1065 Type 1 diabetes mellitus with hyperglycemia: Secondary | ICD-10-CM | POA: Diagnosis present

## 2024-01-21 DIAGNOSIS — F0390 Unspecified dementia without behavioral disturbance: Secondary | ICD-10-CM | POA: Diagnosis present

## 2024-01-21 LAB — COMPREHENSIVE METABOLIC PANEL WITH GFR
ALT: 22 U/L (ref 0–44)
AST: 24 U/L (ref 15–41)
Albumin: 3.7 g/dL (ref 3.5–5.0)
Alkaline Phosphatase: 101 U/L (ref 38–126)
Anion gap: 9 (ref 5–15)
BUN: 27 mg/dL — ABNORMAL HIGH (ref 8–23)
CO2: 24 mmol/L (ref 22–32)
Calcium: 8.4 mg/dL — ABNORMAL LOW (ref 8.9–10.3)
Chloride: 100 mmol/L (ref 98–111)
Creatinine, Ser: 1.42 mg/dL — ABNORMAL HIGH (ref 0.61–1.24)
GFR, Estimated: 53 mL/min — ABNORMAL LOW (ref >60)
Glucose, Bld: 100 mg/dL — ABNORMAL HIGH (ref 70–99)
Potassium: 4 mmol/L (ref 3.5–5.1)
Sodium: 133 mmol/L — ABNORMAL LOW (ref 135–145)
Total Bilirubin: 1.2 mg/dL (ref 0.0–1.2)
Total Protein: 6.7 g/dL (ref 6.5–8.1)

## 2024-01-21 LAB — GASTROINTESTINAL PANEL BY PCR, STOOL (REPLACES STOOL CULTURE)

## 2024-01-21 LAB — C DIFFICILE QUICK SCREEN W PCR REFLEX
C Diff antigen: POSITIVE — AB
C Diff interpretation: DETECTED
C Diff toxin: POSITIVE — AB

## 2024-01-21 LAB — LACTIC ACID, PLASMA
Lactic Acid, Venous: 1.4 mmol/L (ref 0.5–1.9)
Lactic Acid, Venous: 1.4 mmol/L (ref 0.5–1.9)

## 2024-01-21 LAB — CBC
HCT: 37.8 % — ABNORMAL LOW (ref 39.0–52.0)
Hemoglobin: 12.7 g/dL — ABNORMAL LOW (ref 13.0–17.0)
MCH: 29.7 pg (ref 26.0–34.0)
MCHC: 33.6 g/dL (ref 30.0–36.0)
MCV: 88.5 fL (ref 80.0–100.0)
Platelets: 200 K/uL (ref 150–400)
RBC: 4.27 MIL/uL (ref 4.22–5.81)
RDW: 13.5 % (ref 11.5–15.5)
WBC: 13 K/uL — ABNORMAL HIGH (ref 4.0–10.5)
nRBC: 0 % (ref 0.0–0.2)

## 2024-01-21 LAB — LIPASE, BLOOD: Lipase: 34 U/L (ref 11–51)

## 2024-01-21 LAB — PROCALCITONIN: Procalcitonin: 0.2 ng/mL

## 2024-01-21 LAB — CBG MONITORING, ED: Glucose-Capillary: 79 mg/dL (ref 70–99)

## 2024-01-21 LAB — MAGNESIUM: Magnesium: 2 mg/dL (ref 1.7–2.4)

## 2024-01-21 LAB — PHOSPHORUS: Phosphorus: 2.2 mg/dL — ABNORMAL LOW (ref 2.5–4.6)

## 2024-01-21 MED ORDER — MORPHINE SULFATE (PF) 2 MG/ML IV SOLN
2.0000 mg | INTRAVENOUS | Status: DC | PRN
  Administered 2024-01-22 (×2): 2 mg via INTRAVENOUS
  Filled 2024-01-21 (×2): qty 1

## 2024-01-21 MED ORDER — METRONIDAZOLE 500 MG/100ML IV SOLN
500.0000 mg | Freq: Three times a day (TID) | INTRAVENOUS | Status: DC
  Administered 2024-01-21 – 2024-01-23 (×6): 500 mg via INTRAVENOUS
  Filled 2024-01-21 (×6): qty 100

## 2024-01-21 MED ORDER — DIPHENHYDRAMINE HCL 50 MG/ML IJ SOLN: 12.5000 mg | Freq: Three times a day (TID) | INTRAMUSCULAR | Status: DC | PRN

## 2024-01-21 MED ORDER — INSULIN ASPART 100 UNIT/ML IJ SOLN
0.0000 [IU] | Freq: Every day | INTRAMUSCULAR | Status: DC
  Administered 2024-01-23 – 2024-01-24 (×4): 4 [IU] via SUBCUTANEOUS
  Filled 2024-01-21 (×2): qty 1

## 2024-01-21 MED ORDER — MORPHINE SULFATE (PF) 4 MG/ML IV SOLN
4.0000 mg | Freq: Once | INTRAVENOUS | Status: AC
  Administered 2024-01-21: 4 mg via INTRAVENOUS
  Filled 2024-01-21: qty 1

## 2024-01-21 MED ORDER — LEVOTHYROXINE SODIUM 112 MCG PO TABS
112.0000 ug | ORAL_TABLET | Freq: Every day | ORAL | Status: DC
  Administered 2024-01-22 – 2024-01-25 (×7): 112 ug via ORAL
  Filled 2024-01-21 (×4): qty 1

## 2024-01-21 MED ORDER — ONDANSETRON HCL 4 MG/2ML IJ SOLN
4.0000 mg | Freq: Once | INTRAMUSCULAR | Status: AC
  Administered 2024-01-21: 4 mg via INTRAVENOUS
  Filled 2024-01-21: qty 2

## 2024-01-21 MED ORDER — LACTATED RINGERS IV BOLUS
1000.0000 mL | Freq: Once | INTRAVENOUS | Status: AC
  Administered 2024-01-21: 1000 mL via INTRAVENOUS

## 2024-01-21 MED ORDER — VANCOMYCIN HCL 250 MG PO CAPS
500.0000 mg | ORAL_CAPSULE | Freq: Four times a day (QID) | ORAL | Status: DC
Start: 2024-01-21 — End: 2024-01-24
  Administered 2024-01-21 – 2024-01-24 (×16): 500 mg via ORAL
  Filled 2024-01-21 (×14): qty 2

## 2024-01-21 MED ORDER — METHOCARBAMOL 500 MG PO TABS
500.0000 mg | ORAL_TABLET | Freq: Three times a day (TID) | ORAL | Status: DC | PRN
  Administered 2024-01-22: 500 mg via ORAL
  Filled 2024-01-21: qty 1

## 2024-01-21 MED ORDER — PREGABALIN 25 MG PO CAPS
25.0000 mg | ORAL_CAPSULE | Freq: Two times a day (BID) | ORAL | Status: DC
  Administered 2024-01-21 – 2024-01-25 (×13): 25 mg via ORAL
  Filled 2024-01-21 (×8): qty 1

## 2024-01-21 MED ORDER — DEXTROSE 50 % IV SOLN: 50.0000 mL | INTRAVENOUS | Status: DC | PRN

## 2024-01-21 MED ORDER — ACETAMINOPHEN 325 MG PO TABS: 650.0000 mg | ORAL_TABLET | Freq: Four times a day (QID) | ORAL | Status: DC | PRN

## 2024-01-21 MED ORDER — ENOXAPARIN SODIUM 40 MG/0.4ML IJ SOSY
40.0000 mg | PREFILLED_SYRINGE | INTRAMUSCULAR | Status: DC
  Administered 2024-01-22 – 2024-01-24 (×5): 40 mg via SUBCUTANEOUS
  Filled 2024-01-21 (×3): qty 0.4

## 2024-01-21 MED ORDER — INSULIN ASPART 100 UNIT/ML IJ SOLN
0.0000 [IU] | Freq: Three times a day (TID) | INTRAMUSCULAR | Status: DC
  Administered 2024-01-22: 1 [IU] via SUBCUTANEOUS
  Administered 2024-01-23: 5 [IU] via SUBCUTANEOUS
  Administered 2024-01-23: 3 [IU] via SUBCUTANEOUS
  Administered 2024-01-23 (×3): 5 [IU] via SUBCUTANEOUS
  Administered 2024-01-23: 3 [IU] via SUBCUTANEOUS
  Administered 2024-01-24 (×2): 2 [IU] via SUBCUTANEOUS
  Administered 2024-01-24: 7 [IU] via SUBCUTANEOUS
  Administered 2024-01-24: 2 [IU] via SUBCUTANEOUS
  Administered 2024-01-24: 7 [IU] via SUBCUTANEOUS
  Administered 2024-01-24: 2 [IU] via SUBCUTANEOUS
  Administered 2024-01-25 (×2): 5 [IU] via SUBCUTANEOUS
  Filled 2024-01-21 (×8): qty 1

## 2024-01-21 MED ORDER — LACTATED RINGERS IV BOLUS
500.0000 mL | Freq: Once | INTRAVENOUS | Status: AC
  Administered 2024-01-21: 500 mL via INTRAVENOUS

## 2024-01-21 MED ORDER — IOHEXOL 300 MG/ML  SOLN
100.0000 mL | Freq: Once | INTRAMUSCULAR | Status: AC | PRN
  Administered 2024-01-21: 100 mL via INTRAVENOUS

## 2024-01-21 MED ORDER — LACTATED RINGERS IV SOLN: INTRAVENOUS | Status: DC

## 2024-01-21 MED ORDER — OXYCODONE-ACETAMINOPHEN 5-325 MG PO TABS
1.0000 | ORAL_TABLET | ORAL | Status: DC | PRN
  Administered 2024-01-21: 1 via ORAL
  Filled 2024-01-21: qty 1

## 2024-01-21 NOTE — ED Triage Notes (Signed)
 Pt to ED via [facility] with wife for altered mental status. Wife states today for about 3 hours pt ws confused and hallucinating, thought the vacuum cleaner was floating and wife was dancing when he was not.  Pt also having abdominal pain, L foot and leg pain (has infection, on abx) and headache since last night. Vomited 3 times last night. No diarrhea but is having more BM's than usual.   Speech is clear, alert and oriented to person, place, self, situation. However pt is mildly confused. Chewing on something in triage that pt insists was given to him by triage tech (it was not). No arm drift or facial droop.  Pt has type 1 diabetes. Per wife blood sugars have been normal.  Pt has 18# from EMS and received 500mL NS en route.

## 2024-01-21 NOTE — ED Notes (Signed)
 Wife to desk to advised pt BGL is 50 and needs some juice. Juice provided.

## 2024-01-21 NOTE — ED Notes (Signed)
 BGL 85 at 2200

## 2024-01-21 NOTE — ED Notes (Addendum)
 Pt is hallucinating again and twitching. MD made aware. Pt also has this jerking like motion all over. Pt states It feels as if lightening is striking my body from my toes to my head.

## 2024-01-21 NOTE — ED Provider Notes (Signed)
 [facility] Provider Note    Event Date/Time   First MD Initiated Contact with Patient 01/21/24 1507     (approximate)   History   Altered Mental Status   HPI  Jared Hess is a 73 y.o. male who presents to the ED for evaluation of Altered Mental Status   Review medical DC summary from 7/30.  Type 1 diabetes, alpha gal.  Admitted for left ankle cellulitis requiring IV antibiotics after failing outpatient therapy.  Wound cultures growing Enterococcus, de-escalated to amoxicillin  for discharge.   Patient presents for frequent stools, at least 6, delirium, chills and weakness since about midnight last night.  Stopped antibiotics this past Tuesday.  Reports his foot looks much better but his abdomen has been hurting, frequent stools, nausea.  Encephalopathic and delirious at home, wife reports it is somewhat better here but he is still acting somewhat weird.   Physical Exam   Triage Vital Signs: ED Triage Vitals  Encounter Vitals Group     BP 01/21/24 1417 99/79     Girls Systolic BP Percentile --      Girls Diastolic BP Percentile --      Boys Systolic BP Percentile --      Boys Diastolic BP Percentile --      Pulse Rate 01/21/24 1417 95     Resp 01/21/24 1417 20     Temp 01/21/24 1417 98.5 F (36.9 C)     Temp Source 01/21/24 1417 Oral     SpO2 01/21/24 1417 97 %     Weight 01/21/24 1422 182 lb (82.6 kg)     Height 01/21/24 1422 6' 1 (1.854 m)     Head Circumference --      Peak Flow --      Pain Score 01/21/24 1423 8     Pain Loc --      Pain Education --      Exclude from Growth Chart --     Most recent vital signs: Vitals:   01/21/24 1930 01/21/24 2000  BP: (!) 85/48 (!) 91/58  Pulse:  94  Resp:  16  Temp:    SpO2:  100%    General: Awake, no distress.  CV:  Good peripheral perfusion.  Resp:  Normal effort.  Abd:  No distention.  Diffusely tender MSK:  No deformity noted.  Neuro:  No focal deficits  appreciated. Other:     ED Results / Procedures / Treatments   Labs (all labs ordered are listed, but only abnormal results are displayed) Labs Reviewed  C DIFFICILE QUICK SCREEN W PCR REFLEX   - Abnormal; Notable for the following components:      Result Value   C Diff antigen POSITIVE (*)    C Diff toxin POSITIVE (*)    All other components within normal limits  COMPREHENSIVE METABOLIC PANEL WITH GFR - Abnormal; Notable for the following components:   Sodium 133 (*)    Glucose, Bld 100 (*)    BUN 27 (*)    Creatinine, Ser 1.42 (*)    Calcium 8.4 (*)    GFR, Estimated 53 (*)    All other components within normal limits  CBC - Abnormal; Notable for the following components:   WBC 13.0 (*)    Hemoglobin 12.7 (*)    HCT 37.8 (*)    All other components within normal limits  GASTROINTESTINAL PANEL BY PCR, STOOL (REPLACES STOOL CULTURE)  CULTURE, BLOOD (ROUTINE X 2)  CULTURE, BLOOD (ROUTINE X 2)  LIPASE, BLOOD  LACTIC ACID, PLASMA  LACTIC ACID, PLASMA  PROCALCITONIN  URINALYSIS, ROUTINE W REFLEX MICROSCOPIC  MAGNESIUM   PHOSPHORUS  BASIC METABOLIC PANEL WITH GFR  CBC  CBG MONITORING, ED    EKG Sinus rhythm with few PACs, rate of 99.  Right bundle morphology, no STEMI  RADIOLOGY CT abdomen/pelvis interpreted by me without clear signs of acute pathology.  Official radiology report(s): CT ABDOMEN PELVIS W CONTRAST Result Date: 01/21/2024 CLINICAL DATA:  Diffuse abdominal tenderness and stool changes. EXAM: CT ABDOMEN AND PELVIS WITH CONTRAST TECHNIQUE: Multidetector CT imaging of the abdomen and pelvis was performed using the standard protocol following bolus administration of intravenous contrast. RADIATION DOSE REDUCTION: This exam was performed according to the departmental dose-optimization program which includes automated exposure control, adjustment of the mA and/or kV according to patient size and/or use of iterative reconstruction technique. CONTRAST:  100mL OMNIPAQUE   IOHEXOL  300 MG/ML  SOLN COMPARISON:  None Available. FINDINGS: Lower chest: Mild subsegmental volume loss in the lung bases. Heart size normal. No pericardial effusion. No pleural effusion. Distal esophagus is grossly unremarkable. Hepatobiliary: Subcentimeter low-attenuation lesions in the liver, too small to characterize. No specific follow-up necessary. Liver and gallbladder are otherwise unremarkable. No biliary ductal dilatation. Pancreas: Negative. Spleen: Negative. Adrenals/Urinary Tract: Adrenal glands and kidneys are unremarkable. Ureters are decompressed. Bladder is grossly unremarkable. Stomach/Bowel: Stomach, small bowel, appendix and colon are unremarkable. Vascular/Lymphatic: Atherosclerotic calcification of the aorta. No pathologically enlarged lymph nodes. Reproductive: Prostate appears atrophic or absent. Other: No free fluid.  Mesenteries and peritoneum are unremarkable. Musculoskeletal: Degenerative changes in the spine. Dextroconvex scoliosis. Likely old L1 compression fracture. IMPRESSION: 1. No acute findings. 2.  Aortic atherosclerosis (ICD10-I70.0). Electronically Signed   By: Newell Eke M.D.   On: 01/21/2024 17:26    PROCEDURES and INTERVENTIONS:  .Critical Care  Performed by: Claudene Rover, MD Authorized by: Claudene Rover, MD   Critical care provider statement:    Critical care time (minutes):  30   Critical care time was exclusive of:  Separately billable procedures and treating other patients   Critical care was necessary to treat or prevent imminent or life-threatening deterioration of the following conditions:  Circulatory failure   Critical care was time spent personally by me on the following activities:  Development of treatment plan with patient or surrogate, discussions with consultants, evaluation of patient's response to treatment, examination of patient, ordering and review of laboratory studies, ordering and review of radiographic studies, ordering and  performing treatments and interventions, pulse oximetry, re-evaluation of patient's condition and review of old charts .1-3 Lead EKG Interpretation  Performed by: Claudene Rover, MD Authorized by: Claudene Rover, MD     Interpretation: normal     ECG rate:  90   ECG rate assessment: normal     Rhythm: sinus rhythm     Ectopy: none     Conduction: normal     Medications  vancomycin  (VANCOCIN ) capsule 500 mg (has no administration in time range)  metroNIDAZOLE  (FLAGYL ) IVPB 500 mg (500 mg Intravenous New Bag/Given 01/21/24 2048)  lactated ringers  infusion (has no administration in time range)  diphenhydrAMINE  (BENADRYL ) injection 12.5 mg (has no administration in time range)  acetaminophen  (TYLENOL ) tablet 650 mg (has no administration in time range)  insulin  aspart (novoLOG ) injection 0-9 Units (has no administration in time range)  insulin  aspart (novoLOG ) injection 0-5 Units (has no administration in time range)  dextrose  50 % solution 50 mL (has no  administration in time range)  enoxaparin  (LOVENOX ) injection 40 mg (has no administration in time range)  lactated ringers  bolus 1,000 mL (0 mLs Intravenous Stopped 01/21/24 1838)  morphine  (PF) 4 MG/ML injection 4 mg (4 mg Intravenous Given 01/21/24 1653)  ondansetron  (ZOFRAN ) injection 4 mg (4 mg Intravenous Given 01/21/24 1653)  iohexol  (OMNIPAQUE ) 300 MG/ML solution 100 mL (100 mLs Intravenous Contrast Given 01/21/24 1711)  lactated ringers  bolus 1,000 mL (0 mLs Intravenous Stopped 01/21/24 1925)  morphine  (PF) 4 MG/ML injection 4 mg (4 mg Intravenous Given 01/21/24 2104)  lactated ringers  bolus 1,000 mL (1,000 mLs Intravenous New Bag/Given 01/21/24 2048)     IMPRESSION / MDM / ASSESSMENT AND PLAN / ED COURSE  I reviewed the triage vital signs and the nursing notes.  Differential diagnosis includes, but is not limited to, SBO, C. difficile, colitis,  {Patient presents with symptoms of an acute illness or injury that is potentially  life-threatening.  Patient with recent course of antibiotics for cellulitis presents with stool changes and delirium with signs of C. difficile colitis requiring medical admission.  Soft pressures, responsive to fluids without indications for vasopressors while he was with me.  Signs of active C. difficile on stool cultures.  Renal dysfunction near baseline, mild leukocytosis.  Due to his hypotension, meets criteria for severe C. difficile so provide IV Flagyl  and oral vancomycin .  Consult with medicine for admission.  Clinical Course as of 01/21/24 2128  Sat Jan 21, 2024  1903 Reassessed.  Has provided stool.  Awaiting results. [DS]  2039 Reassessed and discussed C. difficile and admission.  Blood pressures are soft.  Will provide additional IV fluids, antibiotics and consult with medicine for admission [DS]    Clinical Course User Index [DS] Claudene Rover, MD     FINAL CLINICAL IMPRESSION(S) / ED DIAGNOSES   Final diagnoses:  C. difficile colitis     Rx / DC Orders   ED Discharge Orders     None        Note:  This document was prepared using Dragon voice recognition software and may include unintentional dictation errors.   Claudene Rover, MD 01/21/24 2132

## 2024-01-21 NOTE — H&P (Incomplete)
 History and Physical    Jared Hess FMW:[id number] DOB: [birth year] DOA: 01/21/2024  Referring MD/NP/PA:   PCP: Lenon Layman ORN, MD   Patient coming from:  The patient is coming from home.     Chief Complaint: Hallucination, nausea, vomiting, abdominal pain,  HPI: Jared Hess is a 73 y.o. male with medical history significant of type-I DM, hypothyroidism, dementia, CKD-3, prostate cancer, left limb regional pain syndrome (his wife denies this diagnosis), chronic back pain, who presents with hallucination, nausea, vomiting, abdominal pain.  Patient was recently hospitalized from 7/25 - 7/30 due to left ankle cellulitis and wound.  Wound cultures growing Enterococcus. Patient was treated with antibiotics. This issue has resolved and wound has healed. Patient states that he continues to have pain in the left ankle and left lower leg, which is shooting like pain, associated with jerky of left leg.  Initially patient did not have issues with upper extremity, but he developed shooting pain in left hand in ED. Per his wife, patient has hallucination, with thought that the vacuum cleaner was floating and wife was dancing when she was not.  No facial droop or slurred speech.  Patient has lower abdominal pain, which is constant, mild to moderate, aching, nonradiating, not aggravated or alleviated by any known factors.  Associated with multiple episodes of nonbilious nonbloody vomiting.  He denies diarrhea, but saying that he has loose stool bowel movement, at least 12 times.  No fever or chills.  No chest pain, cough, SOB.  No symptoms of UTI.  Patient has low blood pressure initially with blood pressure of 79/49, which improved to 96/58 with MAP 71 after giving 3 L LR in ED.  Data reviewed independently and ED Course: pt was found to have WBC 13.0, C. difficile test with positive antigen and toxin, stable renal function, lactic acid of 1.4 --> 1.4, heart rate 90s, RR 16, oxygen saturation  100% on room air, temperature normal.  CT of abdomen/pelvis negative for acute intra-abdominal issues.  Patient is pleasantly PCU for observation.   EKG: I have personally reviewed.  Sinus rhythm, QTc 518, bifascicular block, frequent PAC.   Review of Systems:   General: no fevers, chills, no body weight gain, has fatigue HEENT: no blurry vision, hearing changes or sore throat Respiratory: no dyspnea, coughing, wheezing CV: no chest pain, no palpitations GI: has nausea, vomiting, abdominal pain, loose stool bowel movement GU: no dysuria, burning on urination, increased urinary frequency, hematuria  Ext: no leg edema Neuro: no vision change or hearing loss.  Has shooting pain in left ankle and left lower leg, and also in left hand, with jerk in left leg and left hand. Skin: no rash, no skin tear. MSK: No muscle spasm, no deformity, no limitation of range of movement in spin Heme: No easy bruising.  Travel history: No recent long distant travel. Psychiatry: Has hallucination, no suicidal homicidal ideations.   Allergy:  Allergies  Allergen Reactions   Armoracia Rusticana Ext (Horseradish) Itching, Nausea And Vomiting, Nausea Only and Swelling   Tree Extract Itching, Anaphylaxis and Shortness Of Breath    Walnuts-Throat closes up   [facility] Anaphylaxis, Shortness Of Breath, Itching, Nausea And Vomiting, Nausea Only and Swelling   Bactrim  [Sulfamethoxazole -Trimethoprim ] Rash    Occurred July 2025    Past Medical History:  Diagnosis Date   Bilateral carpal tunnel syndrome    Chronic bilateral low back pain without sciatica    CKD (chronic kidney disease) stage 3, GFR 30-59 ml/min (  HCC)    Dementia (HCC)    DM (diabetes mellitus), type 1 (HCC)    ED (erectile dysfunction)    Hypothyroidism    Neuropathy    Prostate cancer (HCC)    Thyroid disease     Past Surgical History:  Procedure Laterality Date   APPENDECTOMY     BICEPT TENODESIS Right 07/14/2021   Procedure: BICEPS  TENODESIS;  Surgeon: Marchia Drivers, MD;  Location: [facility] ORS;  Service: Orthopedics;  Laterality: Right;   CARPAL TUNNEL RELEASE Right 07/12/2023   Procedure: RIGHT CARPAL TUNNEL RELEASE WITH ULTRASOUND GUIDANCE;  Surgeon: Claudene Penne ORN, MD;  Location: [facility] ORS;  Service: Neurosurgery;  Laterality: Right;   CARPAL TUNNEL RELEASE Left 08/09/2023   Procedure: LEFT CARPAL TUNNEL RELEASE WITH ULTRASOUND GUIDANCE;  Surgeon: Claudene Penne ORN, MD;  Location: [facility] ORS;  Service: Neurosurgery;  Laterality: Left;   FOOT SURGERY Left    LUMBAR FUSION     PROSTATE CRYOABLATION  2010   SHOULDER ARTHROSCOPY WITH OPEN ROTATOR CUFF REPAIR AND DISTAL CLAVICLE ACROMINECTOMY Right 07/14/2021   Procedure: SHOULDER ARTHROSCOPY WITH OPEN ROTATOR CUFF REPAIR AND DISTAL CLAVICLE ACROMINECTOMY;  Surgeon: Marchia Drivers, MD;  Location: [facility] OR[facility];  Service: Orthopedics;  Laterality: Right;   shoulder blade surgery  2002    Social History:  reports that he has never smoked. He has never used smokeless tobacco. He reports that he does not currently use alcohol. He reports that he does not use drugs.  Family History:  Family History  Problem Relation Age of Onset   Diabetes Sister    Prostate cancer Neg Hx    Bladder Cancer Neg Hx    Kidney cancer Neg Hx      Prior to Admission medications   Medication Sig Start Date End Date Taking? Authorizing Provider  acetaminophen  (TYLENOL ) 325 MG tablet Take 2 tablets (650 mg total) by mouth every 6 (six) hours as needed for mild pain (pain score 1-3) or fever (or Fever >/= 101). 01/11/24   Jens Durand, MD  Cyanocobalamin (VITAMIN B-12) 2500 MCG SUBL Place 2,500 mcg under the tongue in the morning.    [provider]  gabapentin  (NEURONTIN ) 300 MG capsule Take 1 capsule (300 mg total) by mouth at bedtime. Please take once at night for one week. May increase to twice a day if tolerating a day Patient not taking: Reported on 11/01/2023 08/24/23   Ulis Bottcher,  PA-C  insulin  lispro (ADMELOG SOLOSTAR) 100 UNIT/ML KwikPen Inject 0-6 Units into the skin with breakfast, with lunch, and with evening meal. Sliding scale    [provider]  Insulin  Pen Needle (BD PEN NEEDLE NANO ULTRAFINE) 32G X 4 MM MISC 1 each by Other route.  Use 1 each 4 (four) times daily 01/02/24   [provider]  LANTUS  SOLOSTAR 100 UNIT/ML Solostar Pen INJECT 20 UNITS SUBCUTANEOUSLY AT BEDTIME 11/04/23   [provider]  levothyroxine  (SYNTHROID ) 112 MCG tablet Take 112 mcg by mouth daily before breakfast. 01/30/19   [provider]    Physical Exam: Vitals:   01/21/24 2200 01/21/24 2230 01/21/24 2300 01/22/24 0000  BP: (!) 94/57 101/62 (!) 86/48 98/60  Pulse: 88 85 82 90  Resp: 18 12 20 19   Temp:      TempSrc:      SpO2: 96% 100% 96% 93%  Weight:      Height:       General: Not in acute distress.  Dry mucous membrane HEENT:  Eyes: PERRL, EOMI, no jaundice       ENT: No discharge from the ears and nose, no pharynx injection, no tonsillar enlargement.        Neck: No JVD, no bruit, no mass felt. Heme: No neck lymph node enlargement. Cardiac: S1/S2, RRR, No murmurs, No gallops or rubs. Respiratory: No rales, wheezing, rhonchi or rubs. GI: Soft, nondistended, has lower abdominal tenderness, no rebound pain, no organomegaly, BS present. GU: No hematuria Ext: No pitting leg edema bilaterally. 1+DP/PT pulse bilaterally. Musculoskeletal: No joint deformities, No joint redness or warmth, no limitation of ROM in spin. Skin: No rashes.  Neuro: Alert, oriented X3, cranial nerves II-XII grossly intact,  Has shooting pain in left ankle and left lower leg, and also in left hand, with jerk in left leg and left hand. Psych: Has hallucination.  No suicidal or hemocidal ideation.  Labs on Admission: I have personally reviewed following labs and imaging studies  CBC: Recent Labs  Lab 01/21/24 1422  WBC 13.0*  HGB 12.7*  HCT 37.8*  MCV 88.5   PLT 200   Basic Metabolic Panel: Recent Labs  Lab 01/21/24 1422  NA 133*  K 4.0  CL 100  CO2 24  GLUCOSE 100*  BUN 27*  CREATININE 1.42*  CALCIUM 8.4*  MG 2.0  PHOS 2.2*   GFR: Estimated Creatinine Clearance: 53.9 mL/min (A) (by C-G formula based on SCr of 1.42 mg/dL (H)). Liver Function Tests: Recent Labs  Lab 01/21/24 1422  AST 24  ALT 22  ALKPHOS 101  BILITOT 1.2  PROT 6.7  ALBUMIN 3.7   Recent Labs  Lab 01/21/24 1422  LIPASE 34   No results for input(s): AMMONIA in the last 168 hours. Coagulation Profile: No results for input(s): INR, PROTIME in the last 168 hours. Cardiac Enzymes: No results for input(s): CKTOTAL, CKMB, CKMBINDEX, TROPONINI in the last 168 hours. BNP (last 3 results) No results for input(s): PROBNP in the last 8760 hours. HbA1C: No results for input(s): HGBA1C in the last 72 hours. CBG: Recent Labs  Lab 01/21/24 1747  GLUCAP 79   Lipid Profile: No results for input(s): CHOL, HDL, LDLCALC, TRIG, CHOLHDL, LDLDIRECT in the last 72 hours. Thyroid Function Tests: No results for input(s): TSH, T4TOTAL, FREET4, T3FREE, THYROIDAB in the last 72 hours. Anemia Panel: No results for input(s): VITAMINB12, FOLATE, FERRITIN, TIBC, IRON, RETICCTPCT in the last 72 hours. Urine analysis:    Component Value Date/Time   COLORURINE YELLOW (A) 01/06/2024 2016   APPEARANCEUR HAZY (A) 01/06/2024 2016   LABSPEC 1.016 01/06/2024 2016   PHURINE 5.0 01/06/2024 2016   GLUCOSEU NEGATIVE 01/06/2024 2016   HGBUR NEGATIVE 01/06/2024 2016   BILIRUBINUR NEGATIVE 01/06/2024 2016   KETONESUR NEGATIVE 01/06/2024 2016   PROTEINUR 30 (A) 01/06/2024 2016   NITRITE NEGATIVE 01/06/2024 2016   LEUKOCYTESUR NEGATIVE 01/06/2024 2016   Sepsis Labs: @LABRCNTIP (procalcitonin:4,lacticidven:4) ) Recent Results (from the past 240 hours)  C Difficile Quick Screen w PCR reflex     Status: Abnormal   Collection Time:  01/21/24  7:20 PM   Specimen: STOOL  Result Value Ref Range Status   C Diff antigen POSITIVE (A) NEGATIVE Final   C Diff toxin POSITIVE (A) NEGATIVE Final   C Diff interpretation Toxin producing C. difficile detected.  Final    Comment: CRITICAL RESULT CALLED TO, READ BACK BY AND VERIFIED WITH: CRYSTAL GUALDONI RN 01/21/24 @ 2024 KKG Performed at [facility], [address] [place], [place] [address]  Gastrointestinal Panel by PCR , Stool     Status: None   Collection Time: 01/21/24  7:20 PM   Specimen: STOOL  Result Value Ref Range Status   Campylobacter species NOT DETECTED NOT DETECTED Final   Plesimonas shigelloides NOT DETECTED NOT DETECTED Final   Salmonella species NOT DETECTED NOT DETECTED Final   Yersinia enterocolitica NOT DETECTED NOT DETECTED Final   Vibrio species NOT DETECTED NOT DETECTED Final   Vibrio cholerae NOT DETECTED NOT DETECTED Final   Enteroaggregative E coli (EAEC) NOT DETECTED NOT DETECTED Final   Enteropathogenic E coli (EPEC) NOT DETECTED NOT DETECTED Final   Enterotoxigenic E coli (ETEC) NOT DETECTED NOT DETECTED Final   Shiga like toxin producing E coli (STEC) NOT DETECTED NOT DETECTED Final   Shigella/Enteroinvasive E coli (EIEC) NOT DETECTED NOT DETECTED Final   Cryptosporidium NOT DETECTED NOT DETECTED Final   Cyclospora cayetanensis NOT DETECTED NOT DETECTED Final   Entamoeba histolytica NOT DETECTED NOT DETECTED Final   Giardia lamblia NOT DETECTED NOT DETECTED Final   Adenovirus F40/41 NOT DETECTED NOT DETECTED Final   Astrovirus NOT DETECTED NOT DETECTED Final   Norovirus GI/GII NOT DETECTED NOT DETECTED Final   Rotavirus A NOT DETECTED NOT DETECTED Final   Sapovirus (I, II, IV, and V) NOT DETECTED NOT DETECTED Final    Comment: Performed at [facility], [address], [place], [place] [address]     Radiological Exams on Admission:   Assessment/Plan Principal Problem:   C. 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 difficile colitis: Patient has positive C. difficile antigen and toxin.  Has mild leukocytosis with WBC of 13.0, but no fever.  Lactic acid normal 1.4 --> 5.4.  Initially hypotensive which responded to IV fluid.  -Admitted to PCU as inpatient - Oral vancomycin  500 mg every 6 hours and IV Flagyl  was ordered by EDP (started to antibiotics due to hypotension) - As needed Zofran  - As needed morphine , Percocet, Tylenol  for pain - IV fluid: 3.5 INR, and then 25 cc/h - Follow-up blood culture  Left ankle and hand pain and Jerking: has shooting pain in the left ankle, left lower leg, associated with jerky of left leg.  Initially patient did not have issues with upper extremity, but he developed shooting pain in left hand in ED. etiology is not clear. left limb regional pain syndrome is on his medical problem list, but his wife denies this diagnosis.  Patient was given 2 mg Ativan  without improvement.  Differential diagnosis include psychosis, spinal stenosis, seizure.  -Will get MRI of the brain and MRI of C-spine - As needed Ativan  - Give 1000 mg of Keppra  IV --> not given since jerking has stopped - Seizure precaution - Frequent neurocheck - Fall precaution - Check vitamin B12 level -EEG  Hallucination: Etiology is not clear. 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  Per report, patient had a CBG 50.  Blood sugar is 100 by BMI.  Patient is taking Lantus  and lispro at home -Hold Lantus  - SSI  CKD stage 3a, GFR 45-59 ml/min (HCC): Stable -Follow-up by BMI  Hypothyroidism -  Synthroid   Dementia (HCC) -fall precaution         DVT ppx:  SQ Lovenox   Code Status: Full code   Family Communication: Yes, patient's wife at bed side.     Disposition Plan:  Anticipate discharge back to previous environment  Consults called:  none  Admission status and Level of care: Progressive: as inpt        Dispo: The patient is from: Home              Anticipated d/c is to: Home              Anticipated d/c date is: 2 days              Patient currently is not medically stable to d/c.    Severity of Illness:  The appropriate patient status for this patient is INPATIENT. Inpatient status is judged to be reasonable and necessary in order to provide the required intensity of service to ensure the patient's safety. The patient's presenting symptoms, physical exam findings, and initial radiographic and laboratory data in the context of their chronic comorbidities is felt to place them at high risk for further clinical deterioration. Furthermore, it is not anticipated that the patient will be medically stable for discharge from the hospital within 2 midnights of admission.   * I certify that at the point of admission it is my clinical judgment that the patient will require inpatient hospital care spanning beyond 2 midnights from the point of admission due to high intensity of service, high risk for further deterioration and high frequency of surveillance required.*       Date of Service 01/22/2024    Caleb Exon [facility]   If 7PM-7AM, please contact night-coverage [email or web address] 01/22/2024, 1:01 AM

## 2024-01-21 NOTE — ED Notes (Addendum)
 RN to bedside to introduce self to pt. Pt is caox4, in no acute distress. Pt advised he is here for pain all over and headaches. Per wife pt was hallucinating yesterday and today. But that has returned to baseline and he is no longer hallucinating. Pt was on antibiotics for the last week and is now having some loose stools and constipation but denies foul smell with BM.

## 2024-01-22 ENCOUNTER — Encounter: Payer: Self-pay | Admitting: Radiology

## 2024-01-22 ENCOUNTER — Inpatient Hospital Stay

## 2024-01-22 DIAGNOSIS — R253 Fasciculation: Secondary | ICD-10-CM | POA: Diagnosis present

## 2024-01-22 DIAGNOSIS — A0472 Enterocolitis due to Clostridium difficile, not specified as recurrent: Secondary | ICD-10-CM | POA: Diagnosis not present

## 2024-01-22 LAB — VITAMIN B12: Vitamin B-12: 620 pg/mL (ref 180–914)

## 2024-01-22 LAB — CBC
HCT: 28.2 % — ABNORMAL LOW (ref 39.0–52.0)
Hemoglobin: 9.4 g/dL — ABNORMAL LOW (ref 13.0–17.0)
MCH: 29.9 pg (ref 26.0–34.0)
MCHC: 33.3 g/dL (ref 30.0–36.0)
MCV: 89.8 fL (ref 80.0–100.0)
Platelets: 136 K/uL — ABNORMAL LOW (ref 150–400)
RBC: 3.14 MIL/uL — ABNORMAL LOW (ref 4.22–5.81)
RDW: 13.9 % (ref 11.5–15.5)
WBC: 8.3 K/uL (ref 4.0–10.5)
nRBC: 0 % (ref 0.0–0.2)

## 2024-01-22 LAB — BASIC METABOLIC PANEL WITH GFR
Anion gap: 8 (ref 5–15)
BUN: 22 mg/dL (ref 8–23)
CO2: 22 mmol/L (ref 22–32)
Calcium: 7.4 mg/dL — ABNORMAL LOW (ref 8.9–10.3)
Chloride: 104 mmol/L (ref 98–111)
Creatinine, Ser: 1.16 mg/dL (ref 0.61–1.24)
GFR, Estimated: >60 mL/min (ref >60)
Glucose, Bld: 107 mg/dL — ABNORMAL HIGH (ref 70–99)
Potassium: 3.7 mmol/L (ref 3.5–5.1)
Sodium: 134 mmol/L — ABNORMAL LOW (ref 135–145)

## 2024-01-22 LAB — GLUCOSE, CAPILLARY: Glucose-Capillary: 144 mg/dL — ABNORMAL HIGH (ref 70–99)

## 2024-01-22 LAB — URINALYSIS, ROUTINE W REFLEX MICROSCOPIC
Bilirubin Urine: NEGATIVE
Glucose, UA: NEGATIVE mg/dL
Hgb urine dipstick: NEGATIVE
Ketones, ur: NEGATIVE mg/dL
Leukocytes,Ua: NEGATIVE
Nitrite: NEGATIVE
Protein, ur: NEGATIVE mg/dL
Specific Gravity, Urine: 1.031 — ABNORMAL HIGH (ref 1.005–1.030)
pH: 5 (ref 5.0–8.0)

## 2024-01-22 LAB — CBG MONITORING, ED
Glucose-Capillary: 120 mg/dL — ABNORMAL HIGH (ref 70–99)
Glucose-Capillary: 129 mg/dL — ABNORMAL HIGH (ref 70–99)
Glucose-Capillary: 88 mg/dL (ref 70–99)
Glucose-Capillary: 102 mg/dL — ABNORMAL HIGH (ref 70–99)

## 2024-01-22 MED ORDER — LORAZEPAM 0.5 MG PO TABS
0.5000 mg | ORAL_TABLET | ORAL | Status: DC | PRN
  Administered 2024-01-23 (×6): 0.5 mg via ORAL
  Filled 2024-01-22 (×3): qty 1

## 2024-01-22 MED ORDER — LACTATED RINGERS IV SOLN: INTRAVENOUS | Status: AC

## 2024-01-22 MED ORDER — LORAZEPAM 2 MG/ML IJ SOLN
2.0000 mg | Freq: Once | INTRAMUSCULAR | Status: AC
  Administered 2024-01-22: 2 mg via INTRAVENOUS
  Filled 2024-01-22: qty 1

## 2024-01-22 MED ORDER — LEVETIRACETAM (KEPPRA) 500 MG/5 ML ADULT IV PUSH
1000.0000 mg | Freq: Once | INTRAVENOUS | Status: DC
  Filled 2024-01-22: qty 10

## 2024-01-22 MED ORDER — LACTATED RINGERS IV BOLUS: 500.0000 mL | Freq: Once | INTRAVENOUS | Status: DC

## 2024-01-22 MED ORDER — LACTATED RINGERS IV SOLN: INTRAVENOUS | Status: DC

## 2024-01-22 MED ORDER — LORAZEPAM 2 MG/ML IJ SOLN
2.0000 mg | INTRAMUSCULAR | Status: AC | PRN
  Administered 2024-01-22 (×2): 2 mg via INTRAVENOUS
  Filled 2024-01-22 (×2): qty 1

## 2024-01-22 NOTE — ED Notes (Signed)
 This RN to bedside to answer call bell. Pt has swelling, numbness and immobility in left hand. Pt jerks when I touched his left hand. MD Hilma raker.

## 2024-01-22 NOTE — ED Notes (Signed)
 Lactated ringers  maintenance infusion switched to 125 mL/hr as instructed by Dr Jerelene while in pts room.

## 2024-01-22 NOTE — ED Notes (Signed)
Pt assisted to toilet to have a BM 

## 2024-01-22 NOTE — Progress Notes (Signed)
 PROGRESS NOTE    Jared Hess  FMW:[id number] DOB: Sep 09, 1950 DOA: 01/21/2024 PCP: Lenon Layman ORN, MD  Chief Complaint  Patient presents with   Altered Mental Status    Hospital Course:  Jared Hess is a 73 y.o. male with medical history significant of type-I DM, hypothyroidism, dementia, CKD-3, prostate cancer, left limb regional pain syndrome (his wife denies this diagnosis), chronic back pain, who presents with hallucination, nausea, vomiting, abdominal pain. Admitted for C.diff colitis   Patient was recently hospitalized from 7/25 - 7/30 due to left ankle cellulitis and wound, recently completed Augmentin  Subjective: Patient was examined at the bedside in ER, new to me today.  Spouse present at bedside States hallucinations are resolved.  Continues to have intermittent shooting pain in LUE and LLE   Objective: Vitals:   01/22/24 0600 01/22/24 0615 01/22/24 0640 01/22/24 0700  BP: 92/62 92/62 90/61  100/61  Pulse: 76 76 78 71  Resp: 17 17 17  (!) 21  Temp:  98.1 F (36.7 C)    TempSrc:  Oral    SpO2: 95% 95% 99% 100%  Weight:      Height:        Intake/Output Summary (Last 24 hours) at 01/22/2024 0916 Last data filed at 01/21/2024 2305 Gross per 24 hour  Intake 3999 ml  Output --  Net 3999 ml   Filed Weights   01/21/24 1422  Weight: 82.6 kg    Examination: General: Not in acute distress Cardiac: S1/S2, RRR, No murmurs, No gallops or rubs. Respiratory: No rales, wheezing, rhonchi or rubs. GI: Soft, nondistended, has left lower abdominal tenderness, no rebound pain, no organomegaly, BS present. GU: No hematuria Ext: No pitting leg edema bilaterally. 1+DP/PT pulse bilaterally. Musculoskeletal: No joint deformities, No joint redness or warmth, no limitation of ROM in spin. Skin: No rashes.  Neuro: Alert, oriented X3, cranial nerves II-XII grossly intact,  Has shooting pain in left ankle and left lower leg, and also in left hand, with jerk in left leg  and left hand. Psych: No hallucination.  No suicidal or hemocidal ideation  Assessment & Plan:  Principal Problem:   C. 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 difficile colitis Patient has positive C. difficile antigen and toxin, Afebrile, leukocytosis resolved - Lactic acid normal 1.4 --> 1.4.  Initially hypotensive which responded to IV fluid - CT of abdomen/pelvis negative for acute intra-abdominal issues - Oral vancomycin  500 mg every 6 hours and IV Flagyl  was ordered by EDP (started two antibiotics due to hypotension) - will discontinue IV Flagyl  once BP stabilizes - As needed Zofran  - As needed morphine , Percocet, Tylenol  for pain - IV fluids - Follow-up blood culture   Left ankle and hand pain and Jerking - has shooting pain in the left ankle, left lower leg, associated with jerky of left leg.  Initially patient did not have issues with upper extremity, but he developed shooting pain in left hand in ED. etiology is not clear. left limb regional pain syndrome is on his medical problem list, but his wife denies this diagnosis.   Patient was given 2 mg Ativan  without improvement.  Differential diagnosis include psychosis, spinal stenosis, seizure  - MRI brain negative for acute abnormalities - MRI C-spine Multilevel cervical spondylosis spinal stenosis at  C5-C6 and C6-C7 multilevel foraminal narrowing.  Right foraminal disc protrusion T1-2.  Right T1 nerve root could be affected - As needed Ativan  - Neurosurgery eval - Frequent neurocheck - Fall precaution   Hallucination: resolved - states was associated with headaches which now resolved - Monitor  Acute drop in Hb - Hb 12.7 -> 9.4 - No evidence of bleeding, s/p IV fluids. Reports mild bleeding with BM's - Monitor Hb   Type 1 diabetes mellitus with  renal complications: Recent A1c 7.6.  Per report, patient had a CBG 50.  Patient is taking Lantus  and lispro at home -Hold Lantus  - SSI   CKD stage 3a, GFR 45-59 ml/min (HCC): Stable -Follow-up BMP   Hypothyroidism - Synthroid    Dementia (HCC) -fall precaution  DVT prophylaxis: Lovenox  SQ   Code Status: Full Code Disposition:  pending  Consultants:  Neurosurgery  Procedures:  None  Antimicrobials:  Anti-infectives (From admission, onward)    Start     Dose/Rate Route Frequency Ordered Stop   01/21/24 2045  vancomycin  (VANCOCIN ) capsule 500 mg        500 mg Oral Every 6 hours 01/21/24 2030 02/04/24 1759   01/21/24 2045  metroNIDAZOLE  (FLAGYL ) IVPB 500 mg        500 mg 100 mL/hr over 60 Minutes Intravenous Every 8 hours 01/21/24 2030 02/04/24 2044       Data Reviewed: I have personally reviewed following labs and imaging studies CBC: Recent Labs  Lab 01/21/24 1422 01/22/24 0554  WBC 13.0* 8.3  HGB 12.7* 9.4*  HCT 37.8* 28.2*  MCV 88.5 89.8  PLT 200 136*   Basic Metabolic Panel: Recent Labs  Lab 01/21/24 1422 01/22/24 0554  NA 133* 134*  K 4.0 3.7  CL 100 104  CO2 24 22  GLUCOSE 100* 107*  BUN 27* 22  CREATININE 1.42* 1.16  CALCIUM 8.4* 7.4*  MG 2.0  --   PHOS 2.2*  --    GFR: Estimated Creatinine Clearance: 66 mL/min (by C-G formula based on SCr of 1.16 mg/dL). Liver Function Tests: Recent Labs  Lab 01/21/24 1422  AST 24  ALT 22  ALKPHOS 101  BILITOT 1.2  PROT 6.7  ALBUMIN 3.7   CBG: Recent Labs  Lab 01/21/24 1747 01/22/24 0600 01/22/24 0750  GLUCAP 79 120* 129*    Recent Results (from the past 240 hours)  Blood culture (routine x 2)     Status: None (Preliminary result)   Collection Time: 01/21/24  4:52 PM   Specimen: BLOOD  Result Value Ref Range Status   Specimen Description BLOOD BLOOD LEFT FOREARM  Final   Special Requests   Final    BOTTLES DRAWN AEROBIC AND ANAEROBIC Blood Culture adequate volume   Culture   Final     NO GROWTH < 12 HOURS Performed at [facility], [address] [place], [place] [address]    Report Status PENDING  Incomplete  Blood culture (routine x 2)     Status: None (Preliminary result)   Collection Time: 01/21/24  4:52 PM   Specimen: BLOOD  Result Value Ref Range Status   Specimen Description BLOOD LEFT ANTECUBITAL  Final   Special Requests   Final    BOTTLES DRAWN AEROBIC AND ANAEROBIC Blood Culture adequate volume   Culture   Final    NO GROWTH < 12 HOURS Performed at [facility], [address], [place], [place] [address]    Report Status PENDING  Incomplete  C Difficile  Quick Screen w PCR reflex     Status: Abnormal   Collection Time: 01/21/24  7:20 PM   Specimen: STOOL  Result Value Ref Range Status   C Diff antigen POSITIVE (A) NEGATIVE Final   C Diff toxin POSITIVE (A) NEGATIVE Final   C Diff interpretation Toxin producing C. difficile detected.  Final    Comment: CRITICAL RESULT CALLED TO, READ BACK BY AND VERIFIED WITH: CRYSTAL GUALDONI RN 01/21/24 @ 2024 KKG Performed at [facility], [address] [place], [place] [address]   Gastrointestinal Panel by PCR , Stool     Status: None   Collection Time: 01/21/24  7:20 PM   Specimen: STOOL  Result Value Ref Range Status   Campylobacter species NOT DETECTED NOT DETECTED Final   Plesimonas shigelloides NOT DETECTED NOT DETECTED Final   Salmonella species NOT DETECTED NOT DETECTED Final   Yersinia enterocolitica NOT DETECTED NOT DETECTED Final   Vibrio species NOT DETECTED NOT DETECTED Final   Vibrio cholerae NOT DETECTED NOT DETECTED Final   Enteroaggregative E coli (EAEC) NOT DETECTED NOT DETECTED Final   Enteropathogenic E coli (EPEC) NOT DETECTED NOT DETECTED Final   Enterotoxigenic E coli (ETEC) NOT DETECTED NOT DETECTED Final   Shiga like toxin producing E coli (STEC) NOT DETECTED NOT DETECTED Final   Shigella/Enteroinvasive E coli (EIEC) NOT DETECTED NOT DETECTED Final    Cryptosporidium NOT DETECTED NOT DETECTED Final   Cyclospora cayetanensis NOT DETECTED NOT DETECTED Final   Entamoeba histolytica NOT DETECTED NOT DETECTED Final   Giardia lamblia NOT DETECTED NOT DETECTED Final   Adenovirus F40/41 NOT DETECTED NOT DETECTED Final   Astrovirus NOT DETECTED NOT DETECTED Final   Norovirus GI/GII NOT DETECTED NOT DETECTED Final   Rotavirus A NOT DETECTED NOT DETECTED Final   Sapovirus (I, II, IV, and V) NOT DETECTED NOT DETECTED Final    Comment: Performed at [facility], [address] [place], [place] [address]     Radiology Studies: MR CERVICAL SPINE WO CONTRAST Result Date: 01/22/2024 CLINICAL DATA:  Initial evaluation for cervical radiculopathy. EXAM: MRI CERVICAL SPINE WITHOUT CONTRAST TECHNIQUE: Multiplanar, multisequence MR imaging of the cervical spine was performed. No intravenous contrast was administered. COMPARISON:  None Available. FINDINGS: Alignment: Mild dextroscoliosis with straightening of the normal cervical lordosis. No significant listhesis. Vertebrae: Vertebral body height maintained without acute or chronic fracture. Bone marrow signal intensity overall within normal limits. No worrisome osseous lesions. Scattered degenerative reactive endplate changes present about the C3-4 through T1-2 interspaces. No other significant abnormal marrow edema. Cord: Normal signal and morphology. Posterior Fossa, vertebral arteries, paraspinal tissues: Unremarkable. Disc levels: C2-C3: Minimal disc bulge with uncovertebral spurring. Moderate left facet arthrosis. No spinal stenosis. Moderate left C3 foraminal narrowing. Right neural foramen remains patent. C3-C4: Advanced intervertebral disc space narrowing with diffuse disc osteophyte complex. Posterior component flattens and indents the ventral thecal sac. Superimposed bilateral facet arthrosis with bony ankylosis on the right. No significant spinal stenosis. Moderate left C4 foraminal narrowing. Right  neural foramen remains patent. C4-C5: Advanced intervertebral disc space narrowing with diffuse disc osteophyte complex. C4 and C5 vertebral bodies are partially ankylosed. Bilateral facet degeneration with ankylosis on the left. No significant spinal stenosis. Foramina remain patent. C5-C6: Advanced intervertebral disc space narrowing with diffuse disc osteophyte complex. Broad posterior component flattens and indents the ventral thecal sac, asymmetric to the left. Minimal cord flattening without cord signal changes. Mild spinal stenosis. Bilateral uncovertebral spurring with mild bilateral C6 foraminal stenosis. C6-C7:  Degenerative intervertebral disc space narrowing with diffuse disc osteophyte complex. Broad posterior component flattens and indents the ventral thecal sac. Mild cord flattening without convincing cord signal changes. Superimposed ligament flavum hypertrophy. Mild spinal stenosis with mild to moderate bilateral C7 foraminal narrowing, slightly worse on the right. C7-T1: Degenerative disc space narrowing with diffuse disc osteophyte complex. Broad base right paracentral posterior component flattens and indents the ventral thecal sac. Minimal cord flattening without cord signal changes. Mild facet hypertrophy. No significant spinal stenosis. Foramina remain patent. T1-2: Seen only on sagittal projection. Diffuse disc bulge with reactive endplate spurring. Superimposed right foraminal disc protrusion (series 5, image 4). No spinal stenosis. Moderate right foraminal narrowing. Left neural foramen remains patent. IMPRESSION: 1. Multilevel cervical spondylosis with resultant mild spinal stenosis at C5-6 and C6-7. 2. Multifactorial degenerative changes with resultant multilevel foraminal narrowing as above. Notable findings include moderate left C3 and C4 foraminal stenosis, mild bilateral C6 foraminal narrowing, with mild to moderate bilateral C7 foraminal stenosis. 3. Right foraminal disc protrusion at  T1-2. The exiting right T1 nerve root could be affected. Electronically Signed   By: Morene Hoard M.D.   On: 01/22/2024 02:51   MR BRAIN WO CONTRAST Result Date: 01/22/2024 CLINICAL DATA:  Initial evaluation for acute psychosis. EXAM: MRI HEAD WITHOUT CONTRAST TECHNIQUE: Multiplanar, multiecho pulse sequences of the brain and surrounding structures were obtained without intravenous contrast. COMPARISON:  MRI from 07/30/2021 FINDINGS: Brain: Cerebral volume within normal limits. Scattered patchy T2/FLAIR hyperintensity involving the periventricular deep white matter both cerebral hemispheres, most characteristic of chronic microvascular ischemic disease, less than is typically seen for age. No abnormal foci of restricted diffusion to suggest acute or subacute ischemia. Gray-white matter differentiation maintained. No areas of chronic cortical infarction. No acute intracranial hemorrhage. Single chronic microhemorrhage noted within the right temporal lobe, stable. No mass lesion, midline shift or mass effect. No hydrocephalus or extra-axial fluid collection. Pituitary gland and suprasellar region within normal limits. Vascular: Major intracranial vascular flow voids are maintained. Skull and upper cervical spine: Craniocervical junction within normal limits. Bone marrow signal intensity within normal limits. No scalp soft tissue abnormality. Sinuses/Orbits: Globes normal soft tissues within normal limits. Scattered mucosal thickening present about the ethmoidal air cells and maxillary sinuses. No significant mastoid effusion. Other: None. IMPRESSION: Essentially normal brain MRI for age. No acute intracranial abnormality. Electronically Signed   By: Morene Hoard M.D.   On: 01/22/2024 02:43   CT ABDOMEN PELVIS W CONTRAST Result Date: 01/21/2024 CLINICAL DATA:  Diffuse abdominal tenderness and stool changes. EXAM: CT ABDOMEN AND PELVIS WITH CONTRAST TECHNIQUE: Multidetector CT imaging of the abdomen  and pelvis was performed using the standard protocol following bolus administration of intravenous contrast. RADIATION DOSE REDUCTION: This exam was performed according to the departmental dose-optimization program which includes automated exposure control, adjustment of the mA and/or kV according to patient size and/or use of iterative reconstruction technique. CONTRAST:  OMNIPAQUE  IOHEXOL  300 MG/ML  SOLN COMPARISON:  None Available. FINDINGS: Lower chest: Mild subsegmental volume loss in the lung bases. Heart size normal. No pericardial effusion. No pleural effusion. Distal esophagus is grossly unremarkable. Hepatobiliary: Subcentimeter low-attenuation lesions in the liver, too small to characterize. No specific follow-up necessary. Liver and gallbladder are otherwise unremarkable. No biliary ductal dilatation. Pancreas: Negative. Spleen: Negative. Adrenals/Urinary Tract: Adrenal glands and kidneys are unremarkable. Ureters are decompressed. Bladder is grossly unremarkable. Stomach/Bowel: Stomach, small bowel, appendix and colon are unremarkable. Vascular/Lymphatic: Atherosclerotic calcification of the aorta. No pathologically enlarged lymph  nodes. Reproductive: Prostate appears atrophic or absent. Other: No free fluid.  Mesenteries and peritoneum are unremarkable. Musculoskeletal: Degenerative changes in the spine. Dextroconvex scoliosis. Likely old L1 compression fracture. IMPRESSION: 1. No acute findings. 2.  Aortic atherosclerosis (ICD10-I70.0). Electronically Signed   By: Newell Eke M.D.   On: 01/21/2024 17:26    Scheduled Meds:  enoxaparin  (LOVENOX ) injection  40 mg Subcutaneous Q24H   insulin  aspart  0-5 Units Subcutaneous QHS   insulin  aspart  0-9 Units Subcutaneous TID WC   levETIRAcetam   1,000 mg Intravenous Once   levothyroxine   112 mcg Oral Q0600   pregabalin   25 mg Oral BID   vancomycin   500 mg Oral Q6H   Continuous Infusions:  lactated ringers  150 mL/hr at 01/22/24 0817    metronidazole  Stopped (01/22/24 0758)     LOS: 1 day  MDM: Patient is high risk for one or more organ failure.  They necessitate ongoing hospitalization for continued IV therapies and subsequent lab monitoring. Total time spent interpreting labs and vitals, reviewing the medical record, coordinating care amongst consultants and care team members, directly assessing and discussing care with the patient and/or family: 55 min  Laree Lock, MD [facility]  To contact the attending physician between 7A-7P please use Epic Chat. To contact the covering physician during after hours 7P-7A, please review Amion.  01/22/2024, 9:16 AM   *This document has been created with the assistance of dictation software. Please excuse typographical errors. *

## 2024-01-22 NOTE — ED Notes (Signed)
 Advised nurse that patient has ready bed

## 2024-01-22 NOTE — ED Notes (Addendum)
 Pt desatting at 89% on RA while asleep. 2L O2 via [place] applied

## 2024-01-23 ENCOUNTER — Inpatient Hospital Stay

## 2024-01-23 DIAGNOSIS — A0472 Enterocolitis due to Clostridium difficile, not specified as recurrent: Secondary | ICD-10-CM | POA: Diagnosis not present

## 2024-01-23 LAB — GLUCOSE, CAPILLARY
Glucose-Capillary: 279 mg/dL — ABNORMAL HIGH (ref 70–99)
Glucose-Capillary: 221 mg/dL — ABNORMAL HIGH (ref 70–99)
Glucose-Capillary: 283 mg/dL — ABNORMAL HIGH (ref 70–99)
Glucose-Capillary: 303 mg/dL — ABNORMAL HIGH (ref 70–99)

## 2024-01-23 LAB — BASIC METABOLIC PANEL WITH GFR
Anion gap: 7 (ref 5–15)
BUN: 21 mg/dL (ref 8–23)
CO2: 23 mmol/L (ref 22–32)
Calcium: 7.7 mg/dL — ABNORMAL LOW (ref 8.9–10.3)
Chloride: 104 mmol/L (ref 98–111)
Creatinine, Ser: 1.59 mg/dL — ABNORMAL HIGH (ref 0.61–1.24)
GFR, Estimated: 46 mL/min — ABNORMAL LOW (ref >60)
Glucose, Bld: 282 mg/dL — ABNORMAL HIGH (ref 70–99)
Potassium: 4 mmol/L (ref 3.5–5.1)
Sodium: 134 mmol/L — ABNORMAL LOW (ref 135–145)

## 2024-01-23 LAB — CBC
HCT: 32 % — ABNORMAL LOW (ref 39.0–52.0)
Hemoglobin: 10.4 g/dL — ABNORMAL LOW (ref 13.0–17.0)
MCH: 29.4 pg (ref 26.0–34.0)
MCHC: 32.5 g/dL (ref 30.0–36.0)
MCV: 90.4 fL (ref 80.0–100.0)
Platelets: 155 K/uL (ref 150–400)
RBC: 3.54 MIL/uL — ABNORMAL LOW (ref 4.22–5.81)
RDW: 13.6 % (ref 11.5–15.5)
WBC: 6.6 K/uL (ref 4.0–10.5)
nRBC: 0 % (ref 0.0–0.2)

## 2024-01-23 LAB — PHOSPHORUS: Phosphorus: 2.4 mg/dL — ABNORMAL LOW (ref 2.5–4.6)

## 2024-01-23 LAB — MAGNESIUM: Magnesium: 1.7 mg/dL (ref 1.7–2.4)

## 2024-01-23 MED ORDER — INSULIN GLARGINE-YFGN 100 UNIT/ML ~~LOC~~ SOLN
10.0000 [IU] | Freq: Every day | SUBCUTANEOUS | Status: DC
  Administered 2024-01-23 (×2): 10 [IU] via SUBCUTANEOUS
  Filled 2024-01-23: qty 0.1

## 2024-01-23 MED ORDER — POTASSIUM & SODIUM PHOSPHATES 280-160-250 MG PO PACK
1.0000 | PACK | Freq: Three times a day (TID) | ORAL | Status: AC
  Administered 2024-01-23 (×6): 1 via ORAL
  Filled 2024-01-23 (×3): qty 1

## 2024-01-23 MED ORDER — MAGNESIUM SULFATE IN D5W 1-5 GM/100ML-% IV SOLN
1.0000 g | Freq: Once | INTRAVENOUS | Status: AC
  Administered 2024-01-23 (×2): 1 g via INTRAVENOUS
  Filled 2024-01-23: qty 100

## 2024-01-23 NOTE — Progress Notes (Signed)
 Eeg done

## 2024-01-23 NOTE — Plan of Care (Signed)

## 2024-01-23 NOTE — Progress Notes (Signed)
 PROGRESS NOTE    Jared Hess  FMW:[id number] DOB: 20-Dec-1950 DOA: 01/21/2024 PCP: Lenon Layman ORN, MD  Chief Complaint  Patient presents with   Altered Mental Status    Hospital Course:  Jared Hess is a 73 y.o. male with medical history significant of type-I DM, hypothyroidism, dementia, CKD-3, prostate cancer, left limb regional pain syndrome (his wife denies this diagnosis), chronic back pain, who presents with hallucination, nausea, vomiting, abdominal pain. Admitted for C.diff colitis. Also had jerkly movements in the LUE and LLE, Neurology evaluation pending   Patient was recently hospitalized from 7/25 - 7/30 due to left ankle cellulitis and wound, recently completed Augmentin  Subjective: Patient was examined at the bedside. Spouse present at bedside States hallucinations are resolved.  Continues to have intermittent shooting pain in LUE and LLE Ongoing diarrhea multiple episodes with electrolytes abnormalities   Objective: Vitals:   01/22/24 2300 01/23/24 0445 01/23/24 0800 01/23/24 1222  BP: (!) 106/58 104/68 127/81 119/77  Pulse: 72 70 69 67  Resp: 20 20 19 20   Temp: 98 F (36.7 C) 98.1 F (36.7 C) 97.8 F (36.6 C) 98 F (36.7 C)  TempSrc: Axillary Oral    SpO2: 98% 99% 96% 97%  Weight:      Height:        Intake/Output Summary (Last 24 hours) at 01/23/2024 1534 Last data filed at 01/23/2024 1300 Gross per 24 hour  Intake 1771.31 ml  Output 400 ml  Net 1371.31 ml   Filed Weights   01/21/24 1422  Weight: 82.6 kg    Examination: General: Not in acute distress Cardiac: S1/S2, RRR, No murmurs, No gallops or rubs. Respiratory: No rales, wheezing, rhonchi or rubs. GI: Soft, nondistended, has left lower abdominal tenderness, no rebound pain, no organomegaly, BS present. GU: No hematuria Ext: No pitting leg edema bilaterally. 1+DP/PT pulse bilaterally. Musculoskeletal: No joint deformities, No joint redness or warmth, no limitation of ROM in  spin. Skin: No rashes.  Neuro: Alert, oriented X3, cranial nerves II-XII grossly intact,  Has shooting pain in left ankle and left lower leg, and also in left hand, with jerk in left leg and left hand. Psych: No hallucination.  No suicidal or hemocidal ideation  Assessment & Plan:  Principal Problem:   C. 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 difficile colitis Patient has positive C. difficile antigen and toxin, Afebrile, leukocytosis resolved. No prior episodes of C.diff - Lactic acid normal 1.4 --> 1.4.  Initially hypotensive which responded to IV fluid - CT of abdomen/pelvis negative for acute intra-abdominal issues - Bcx x 2 NGTD - Oral vancomycin  500 mg every 6 hours. Discontinue IV Flagyl  - As needed Zofran  - As needed morphine , Percocet, Tylenol  for pain - IV fluids - Follow-up blood culture   Left ankle and hand pain and Jerking - has shooting pain in the left ankle, left lower leg, associated with jerky of left leg.  Initially patient did not have issues with upper extremity, but he developed shooting pain in left hand in ED. etiology is not clear. left limb regional pain syndrome is on his medical problem list, but his wife denies this diagnosis.  Patient was given 2 mg Ativan  without improvement.  Differential diagnosis include psychosis, spinal stenosis, seizure - Reports have stiffness  in both upper extremities which resolved with change in position - MRI brain negative for acute abnormalities - MRI C-spine Multilevel cervical spondylosis spinal stenosis at C5-C6 and C6-C7 multilevel foraminal narrowing.  Right foraminal disc protrusion T1-2.  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  Per report, patient had a CBG 50.  Patient is taking Lantus  and lispro at home - Resume Lantus  10u (takes 20u at home) - SSI  Hypophosphatemia Hypomagnesemia - Monitor and replete as needed   CKD stage 3a, GFR 45-59 ml/min (HCC): Stable -Follow-up BMP   Hypothyroidism - Synthroid    Dementia (HCC) -fall precaution   DVT prophylaxis: Lovenox  SQ   Code Status: Full Code Disposition:  pending  Consultants:  Neurosurgery Neurology  Procedures:  None  Antimicrobials:  Anti-infectives (From admission, onward)    Start     Dose/Rate Route Frequency Ordered Stop   01/21/24 2045  vancomycin  (VANCOCIN ) capsule 500 mg        500 mg Oral Every 6 hours 01/21/24 2030 02/04/24 1759   01/21/24 2045  metroNIDAZOLE  (FLAGYL ) IVPB 500 mg  Status:  Discontinued        500 mg 100 mL/hr over 60 Minutes Intravenous Every 8 hours 01/21/24 2030 01/23/24 0906       Data Reviewed: I have personally reviewed following labs and imaging studies CBC: Recent Labs  Lab 01/21/24 1422 01/22/24 0554 01/23/24 0358  WBC 13.0* 8.3 6.6  HGB 12.7* 9.4* 10.4*  HCT 37.8* 28.2* 32.0*  MCV 88.5 89.8 90.4  PLT 200 136* 155   Basic Metabolic Panel: Recent Labs  Lab 01/21/24 1422 01/22/24 0554 01/23/24 0358  NA 133* 134* 134*  K 4.0 3.7 4.0  CL 100 104 104  CO2 24 22 23   GLUCOSE 100* 107* 282*  BUN 27* 22 21  CREATININE 1.42* 1.16 1.59*  CALCIUM 8.4* 7.4* 7.7*  MG 2.0  --  1.7  PHOS 2.2*  --  2.4*   GFR: Estimated Creatinine Clearance: 48.2 mL/min (A) (by C-G formula based on SCr of 1.59 mg/dL (H)). Liver Function Tests: Recent Labs  Lab 01/21/24 1422  AST 24  ALT 22  ALKPHOS 101  BILITOT 1.2  PROT 6.7  ALBUMIN 3.7   CBG: Recent  Labs  Lab 01/22/24 1208 01/22/24 1739 01/22/24 2112 01/23/24 0847 01/23/24 1243  GLUCAP 88 102* 144* 279* 221*    Recent Results (from the past 240 hours)  Blood culture (routine x 2)     Status: None (Preliminary result)   Collection Time: 01/21/24  4:52 PM   Specimen: BLOOD  Result Value Ref Range Status   Specimen Description BLOOD BLOOD LEFT FOREARM  Final   Special Requests   Final    BOTTLES DRAWN AEROBIC AND ANAEROBIC Blood Culture adequate volume   Culture   Final    NO GROWTH 2 DAYS Performed at [facility], [address], [place], [place] [address]    Report Status PENDING  Incomplete  Blood culture (routine x 2)     Status: None (Preliminary result)   Collection Time: 01/21/24  4:52  PM   Specimen: BLOOD  Result Value Ref Range Status   Specimen Description BLOOD LEFT ANTECUBITAL  Final   Special Requests   Final    BOTTLES DRAWN AEROBIC AND ANAEROBIC Blood Culture adequate volume   Culture   Final    NO GROWTH 2 DAYS Performed at [facility], [address], [place], [place] [address]    Report Status PENDING  Incomplete  C Difficile Quick Screen w PCR reflex     Status: Abnormal   Collection Time: 01/21/24  7:20 PM   Specimen: STOOL  Result Value Ref Range Status   C Diff antigen POSITIVE (A) NEGATIVE Final   C Diff toxin POSITIVE (A) NEGATIVE Final   C Diff interpretation Toxin producing C. difficile detected.  Final    Comment: CRITICAL RESULT CALLED TO, READ BACK BY AND VERIFIED WITH: CRYSTAL GUALDONI RN 01/21/24 @ 2024 KKG Performed at [facility], [address] [place], [place] [address]   Gastrointestinal Panel by PCR , Stool     Status: None   Collection Time: 01/21/24  7:20 PM   Specimen: STOOL  Result Value Ref Range Status   Campylobacter species NOT DETECTED NOT DETECTED Final   Plesimonas shigelloides NOT DETECTED NOT DETECTED Final   Salmonella species NOT DETECTED NOT DETECTED Final   Yersinia  enterocolitica NOT DETECTED NOT DETECTED Final   Vibrio species NOT DETECTED NOT DETECTED Final   Vibrio cholerae NOT DETECTED NOT DETECTED Final   Enteroaggregative E coli (EAEC) NOT DETECTED NOT DETECTED Final   Enteropathogenic E coli (EPEC) NOT DETECTED NOT DETECTED Final   Enterotoxigenic E coli (ETEC) NOT DETECTED NOT DETECTED Final   Shiga like toxin producing E coli (STEC) NOT DETECTED NOT DETECTED Final   Shigella/Enteroinvasive E coli (EIEC) NOT DETECTED NOT DETECTED Final   Cryptosporidium NOT DETECTED NOT DETECTED Final   Cyclospora cayetanensis NOT DETECTED NOT DETECTED Final   Entamoeba histolytica NOT DETECTED NOT DETECTED Final   Giardia lamblia NOT DETECTED NOT DETECTED Final   Adenovirus F40/41 NOT DETECTED NOT DETECTED Final   Astrovirus NOT DETECTED NOT DETECTED Final   Norovirus GI/GII NOT DETECTED NOT DETECTED Final   Rotavirus A NOT DETECTED NOT DETECTED Final   Sapovirus (I, II, IV, and V) NOT DETECTED NOT DETECTED Final    Comment: Performed at [facility], [address], [place], [place] [address]     Radiology Studies: MR CERVICAL SPINE WO CONTRAST Result Date: 01/22/2024 CLINICAL DATA:  Initial evaluation for cervical radiculopathy. EXAM: MRI CERVICAL SPINE WITHOUT CONTRAST TECHNIQUE: Multiplanar, multisequence MR imaging of the cervical spine was performed. No intravenous contrast was administered. COMPARISON:  None Available. FINDINGS: Alignment: Mild dextroscoliosis with straightening of the normal cervical lordosis. No significant listhesis. Vertebrae: Vertebral body height maintained without acute or chronic fracture. Bone marrow signal intensity overall within normal limits. No worrisome osseous lesions. Scattered degenerative reactive endplate changes present about the C3-4 through T1-2 interspaces. No other significant abnormal marrow edema. Cord: Normal signal and morphology. Posterior Fossa, vertebral arteries, paraspinal tissues: Unremarkable.  Disc levels: C2-C3: Minimal disc bulge with uncovertebral spurring. Moderate left facet arthrosis. No spinal stenosis. Moderate left C3 foraminal narrowing. Right neural foramen remains patent. C3-C4: Advanced intervertebral disc space narrowing with diffuse disc osteophyte complex. Posterior component flattens and indents the ventral thecal sac. Superimposed bilateral facet arthrosis with bony ankylosis on the right. No significant spinal stenosis. Moderate left C4 foraminal narrowing. Right neural foramen remains patent. C4-C5: Advanced intervertebral disc  space narrowing with diffuse disc osteophyte complex. C4 and C5 vertebral bodies are partially ankylosed. Bilateral facet degeneration with ankylosis on the left. No significant spinal stenosis. Foramina remain patent. C5-C6: Advanced intervertebral disc space narrowing with diffuse disc osteophyte complex. Broad posterior component flattens and indents the ventral thecal sac, asymmetric to the left. Minimal cord flattening without cord signal changes. Mild spinal stenosis. Bilateral uncovertebral spurring with mild bilateral C6 foraminal stenosis. C6-C7: Degenerative intervertebral disc space narrowing with diffuse disc osteophyte complex. Broad posterior component flattens and indents the ventral thecal sac. Mild cord flattening without convincing cord signal changes. Superimposed ligament flavum hypertrophy. Mild spinal stenosis with mild to moderate bilateral C7 foraminal narrowing, slightly worse on the right. C7-T1: Degenerative disc space narrowing with diffuse disc osteophyte complex. Broad base right paracentral posterior component flattens and indents the ventral thecal sac. Minimal cord flattening without cord signal changes. Mild facet hypertrophy. No significant spinal stenosis. Foramina remain patent. T1-2: Seen only on sagittal projection. Diffuse disc bulge with reactive endplate spurring. Superimposed right foraminal disc protrusion (series 5,  image 4). No spinal stenosis. Moderate right foraminal narrowing. Left neural foramen remains patent. IMPRESSION: 1. Multilevel cervical spondylosis with resultant mild spinal stenosis at C5-6 and C6-7. 2. Multifactorial degenerative changes with resultant multilevel foraminal narrowing as above. Notable findings include moderate left C3 and C4 foraminal stenosis, mild bilateral C6 foraminal narrowing, with mild to moderate bilateral C7 foraminal stenosis. 3. Right foraminal disc protrusion at T1-2. The exiting right T1 nerve root could be affected. Electronically Signed   By: Morene Hoard M.D.   On: 01/22/2024 02:51   MR BRAIN WO CONTRAST Result Date: 01/22/2024 CLINICAL DATA:  Initial evaluation for acute psychosis. EXAM: MRI HEAD WITHOUT CONTRAST TECHNIQUE: Multiplanar, multiecho pulse sequences of the brain and surrounding structures were obtained without intravenous contrast. COMPARISON:  MRI from 07/30/2021 FINDINGS: Brain: Cerebral volume within normal limits. Scattered patchy T2/FLAIR hyperintensity involving the periventricular deep white matter both cerebral hemispheres, most characteristic of chronic microvascular ischemic disease, less than is typically seen for age. No abnormal foci of restricted diffusion to suggest acute or subacute ischemia. Gray-white matter differentiation maintained. No areas of chronic cortical infarction. No acute intracranial hemorrhage. Single chronic microhemorrhage noted within the right temporal lobe, stable. No mass lesion, midline shift or mass effect. No hydrocephalus or extra-axial fluid collection. Pituitary gland and suprasellar region within normal limits. Vascular: Major intracranial vascular flow voids are maintained. Skull and upper cervical spine: Craniocervical junction within normal limits. Bone marrow signal intensity within normal limits. No scalp soft tissue abnormality. Sinuses/Orbits: Globes normal soft tissues within normal limits. Scattered  mucosal thickening present about the ethmoidal air cells and maxillary sinuses. No significant mastoid effusion. Other: None. IMPRESSION: Essentially normal brain MRI for age. No acute intracranial abnormality. Electronically Signed   By: Morene Hoard M.D.   On: 01/22/2024 02:43   CT ABDOMEN PELVIS W CONTRAST Result Date: 01/21/2024 CLINICAL DATA:  Diffuse abdominal tenderness and stool changes. EXAM: CT ABDOMEN AND PELVIS WITH CONTRAST TECHNIQUE: Multidetector CT imaging of the abdomen and pelvis was performed using the standard protocol following bolus administration of intravenous contrast. RADIATION DOSE REDUCTION: This exam was performed according to the departmental dose-optimization program which includes automated exposure control, adjustment of the mA and/or kV according to patient size and/or use of iterative reconstruction technique. CONTRAST:  OMNIPAQUE  IOHEXOL  300 MG/ML  SOLN COMPARISON:  None Available. FINDINGS: Lower chest: Mild subsegmental volume loss in the lung bases. Heart size  normal. No pericardial effusion. No pleural effusion. Distal esophagus is grossly unremarkable. Hepatobiliary: Subcentimeter low-attenuation lesions in the liver, too small to characterize. No specific follow-up necessary. Liver and gallbladder are otherwise unremarkable. No biliary ductal dilatation. Pancreas: Negative. Spleen: Negative. Adrenals/Urinary Tract: Adrenal glands and kidneys are unremarkable. Ureters are decompressed. Bladder is grossly unremarkable. Stomach/Bowel: Stomach, small bowel, appendix and colon are unremarkable. Vascular/Lymphatic: Atherosclerotic calcification of the aorta. No pathologically enlarged lymph nodes. Reproductive: Prostate appears atrophic or absent. Other: No free fluid.  Mesenteries and peritoneum are unremarkable. Musculoskeletal: Degenerative changes in the spine. Dextroconvex scoliosis. Likely old L1 compression fracture. IMPRESSION: 1. No acute findings. 2.   Aortic atherosclerosis (ICD10-I70.0). Electronically Signed   By: Newell Eke M.D.   On: 01/21/2024 17:26    Scheduled Meds:  enoxaparin  (LOVENOX ) injection  40 mg Subcutaneous Q24H   insulin  aspart  0-5 Units Subcutaneous QHS   insulin  aspart  0-9 Units Subcutaneous TID WC   insulin  glargine-yfgn  10 Units Subcutaneous QHS   levothyroxine   112 mcg Oral Q0600   potassium & sodium phosphates   1 packet Oral TID WC & HS   pregabalin   25 mg Oral BID   vancomycin   500 mg Oral Q6H   Continuous Infusions:  lactated ringers  125 mL/hr at 01/22/24 1531     LOS: 2 days  MDM: Patient is high risk for one or more organ failure.  They necessitate ongoing hospitalization for continued IV therapies and subsequent lab monitoring. Total time spent interpreting labs and vitals, reviewing the medical record, coordinating care amongst consultants and care team members, directly assessing and discussing care with the patient and/or family: 55 min  Laree Lock, MD [facility]  To contact the attending physician between 7A-7P please use Epic Chat. To contact the covering physician during after hours 7P-7A, please review Amion.  01/23/2024, 3:34 PM   *This document has been created with the assistance of dictation software. Please excuse typographical errors. *

## 2024-01-23 NOTE — Progress Notes (Signed)
 PT Cancellation Note  Patient Details Name: Jared Hess MRN: [id number] DOB: 1950/12/29   Cancelled Treatment:    Reason Eval/Treat Not Completed: Other (comment). Consult received and chart reviewed. Pt currently off unit at this time, discussed with wife at bedside. Pt is indep with mobility and no acute needs present. Will dc in house.   Tynesia Harral 01/23/2024, 2:28 PM Corean Dade, PT, DPT, GCS [phone]

## 2024-01-23 NOTE — Plan of Care (Signed)

## 2024-01-23 NOTE — Care Management Important Message (Signed)
 Important Message  Patient Details  Name: Jared Hess MRN: [id number] Date of Birth: 1950-08-22   Important Message Given:  Yes - Medicare IM     Jared Hess 01/23/2024, 12:44 PM

## 2024-01-24 DIAGNOSIS — B9689 Other specified bacterial agents as the cause of diseases classified elsewhere: Secondary | ICD-10-CM

## 2024-01-24 DIAGNOSIS — R569 Unspecified convulsions: Secondary | ICD-10-CM

## 2024-01-24 DIAGNOSIS — R937 Abnormal findings on diagnostic imaging of other parts of musculoskeletal system: Secondary | ICD-10-CM | POA: Diagnosis not present

## 2024-01-24 DIAGNOSIS — R52 Pain, unspecified: Secondary | ICD-10-CM | POA: Diagnosis not present

## 2024-01-24 DIAGNOSIS — A0472 Enterocolitis due to Clostridium difficile, not specified as recurrent: Secondary | ICD-10-CM | POA: Diagnosis not present

## 2024-01-24 LAB — BASIC METABOLIC PANEL WITH GFR
Anion gap: 10 (ref 5–15)
BUN: 18 mg/dL (ref 8–23)
CO2: 24 mmol/L (ref 22–32)
Calcium: 8 mg/dL — ABNORMAL LOW (ref 8.9–10.3)
Chloride: 101 mmol/L (ref 98–111)
Creatinine, Ser: 1.35 mg/dL — ABNORMAL HIGH (ref 0.61–1.24)
GFR, Estimated: 56 mL/min — ABNORMAL LOW (ref >60)
Glucose, Bld: 304 mg/dL — ABNORMAL HIGH (ref 70–99)
Potassium: 4.1 mmol/L (ref 3.5–5.1)
Sodium: 135 mmol/L (ref 135–145)

## 2024-01-24 LAB — CBC
HCT: 31.5 % — ABNORMAL LOW (ref 39.0–52.0)
Hemoglobin: 10.4 g/dL — ABNORMAL LOW (ref 13.0–17.0)
MCH: 29.1 pg (ref 26.0–34.0)
MCHC: 33 g/dL (ref 30.0–36.0)
MCV: 88.2 fL (ref 80.0–100.0)
Platelets: 188 K/uL (ref 150–400)
RBC: 3.57 MIL/uL — ABNORMAL LOW (ref 4.22–5.81)
RDW: 13.2 % (ref 11.5–15.5)
WBC: 5.8 K/uL (ref 4.0–10.5)
nRBC: 0 % (ref 0.0–0.2)

## 2024-01-24 LAB — GLUCOSE, CAPILLARY
Glucose-Capillary: 304 mg/dL — ABNORMAL HIGH (ref 70–99)
Glucose-Capillary: 189 mg/dL — ABNORMAL HIGH (ref 70–99)
Glucose-Capillary: 171 mg/dL — ABNORMAL HIGH (ref 70–99)
Glucose-Capillary: 308 mg/dL — ABNORMAL HIGH (ref 70–99)
Glucose-Capillary: 316 mg/dL — ABNORMAL HIGH (ref 70–99)

## 2024-01-24 LAB — PHOSPHORUS: Phosphorus: 2.7 mg/dL (ref 2.5–4.6)

## 2024-01-24 LAB — MAGNESIUM: Magnesium: 1.9 mg/dL (ref 1.7–2.4)

## 2024-01-24 MED ORDER — LORAZEPAM 0.5 MG PO TABS
0.2500 mg | ORAL_TABLET | ORAL | Status: DC | PRN
  Administered 2024-01-24 (×2): 0.25 mg via ORAL
  Filled 2024-01-24: qty 1

## 2024-01-24 MED ORDER — INSULIN GLARGINE-YFGN 100 UNIT/ML ~~LOC~~ SOLN
16.0000 [IU] | Freq: Every day | SUBCUTANEOUS | Status: DC
  Filled 2024-01-24: qty 0.16

## 2024-01-24 MED ORDER — VANCOMYCIN HCL 125 MG PO CAPS
125.0000 mg | ORAL_CAPSULE | Freq: Four times a day (QID) | ORAL | Status: DC
  Administered 2024-01-24 – 2024-01-25 (×10): 125 mg via ORAL
  Filled 2024-01-24 (×7): qty 1

## 2024-01-24 MED ORDER — INSULIN GLARGINE-YFGN 100 UNIT/ML ~~LOC~~ SOLN
15.0000 [IU] | Freq: Every day | SUBCUTANEOUS | Status: DC
  Administered 2024-01-24 (×2): 15 [IU] via SUBCUTANEOUS
  Filled 2024-01-24: qty 0.15

## 2024-01-24 MED ORDER — CARBAMAZEPINE 200 MG PO TABS
200.0000 mg | ORAL_TABLET | Freq: Three times a day (TID) | ORAL | Status: DC
  Filled 2024-01-24: qty 1

## 2024-01-24 MED ORDER — CARBAMAZEPINE 200 MG PO TABS
200.0000 mg | ORAL_TABLET | Freq: Two times a day (BID) | ORAL | Status: DC
  Administered 2024-01-24 – 2024-01-25 (×4): 200 mg via ORAL
  Filled 2024-01-24 (×2): qty 1

## 2024-01-24 NOTE — Procedures (Addendum)
 Patient Name: Jared Hess  MRN: [id number]  Epilepsy Attending: Arlin MALVA Krebs  Referring Physician/Provider: Niu, Xilin, MD  Date: 01/23/2024 Duration: 30.36 mins  Patient history: 73yo M is admitted for C. difficile colitis.  Has hallucination.  Has left ankle and left lower leg pain, also has left hand pain, with jerking. EEG to evaluate for seizure  Level of alertness: Awake, asleep  AEDs during EEG study: PGB  Technical aspects: This EEG study was done with scalp electrodes positioned according to the 10-20 International system of electrode placement. Electrical activity was reviewed with band pass filter of 1-70Hz , sensitivity of 7 uV/mm, display speed of 80mm/sec with a 60Hz  notched filter applied as appropriate. EEG data were recorded continuously and digitally stored.  Video monitoring was available and reviewed as appropriate.  Description: The posterior dominant rhythm consists of 8.5Hz  activity of moderate voltage (25-35 uV) seen predominantly in posterior head regions, symmetric and reactive to eye opening and eye closing. Sleep was characterized by vertex waves, sleep spindles (12 to 14 Hz), maximal frontocentral region. Hyperventilation and photic stimulation were not performed.     IMPRESSION: This study is within normal limits. No seizures or epileptiform discharges were seen throughout the recording.  A normal interictal EEG does not exclude the diagnosis of epilepsy.   Jared Hess

## 2024-01-24 NOTE — Consult Note (Signed)
 NEUROLOGY CONSULT NOTE   Date of service: January 24, 2024 Patient Name: Jared Hess MRN:  [id number] DOB:  06-25-1950 Chief Complaint: Leg spasms Requesting Provider: Jerelene Critchley, MD  History of Present Illness  Jared Hess is a 73 y.o. retired Occupational hygienist with a past medical history significant for concern for Alzheimer's dementia,  Patient and wife note that 6 hours after developing GI symptoms that he began to have severe pain in the left leg which he describes as a hot poker shooting up the leg, and then going up both arms and into his head.  He was started on Lyrica  and does feel that his symptoms have improved but still fairly significant and preventing p.o. intake due to nausea secondary to the pain.  He was additionally diagnosed with C. difficile colitis for which he is now on antibiotics  Wife also notes that at 1 point he seemed to have trouble moving both of his hands as well as they were locked up in a clawlike position and he could not extend or flex his fingers, and at 1 point this seemed to resolve with changing the elevation of his bed.  She also notes that at one point he was having such frequent spasms that it almost seemed like seizures although he never had loss of consciousness during these events  Of note I last saw him for left foot pain 7/28 at which time there was some concern for intermittent swelling and redness of the foot as well possibly consistent with complex regional pain syndrome.  He reports that this pain fully resolved (consistent with outpatient neurology notes from 8/7   He was seen by Dr. Maree in outpatient follow-up on 01/19/2024, at which time he was advised to wear compression socks and use massage to reduce swelling.  Symptoms were noted to be resolved at the time reducing concern for complex regional pain syndrome.  There was also concern for possible late onset spinal muscular atrophy given worsening numbness of his hands and significant  atrophy of the bilateral hands  Additional prior neurological history:    Per notes from Dr. 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   Of note he also had concern for bilateral carpal tunnel syndrome confirmed electrodiagnostically leading to difficulty buttoning buttons and zippering zippers.  This was diagnosed with EMG/nerve conduction study 05/31/2023.  He had surgery for the same in late February 2025, and of note in follow-up in March 2025 he was noted to have severe pain such that he could not sleep and was unable to move or use his hands, with pain in the medial, ulnar, and radial nerve distributions for which he was started on a Medrol  Dosepak, gabapentin  and had MRI brachial plexus performed which showed no evidence of plexopathy but did demonstrate mild to moderate canal stenosis at C5-T1, degenerative changes of the shoulders, and a 10 mm chest nodule.  It appears that his symptoms did respond to steroids and subsequently he had EMG/nerve conduction study completed which was consistent with bilateral carpal tunnel syndrome without other significant findings   Wife and patient confirmed that the pain he had last late July does seem somewhat similar  to the pain he had after his carpal tunnel surgery   Regarding cognitive testing it appears that a paraneoplastic panel was planned but do not see that this was ever completed; paraneoplastic panel sent 7/29 has not yet resulted He did have serologic testing for Alzheimer's disease: Normal Beta Amyloid 42/40 ratio, normal p-Tau-181, and normal plasma Neurofilament Light chain protein. Elevated p-tau 217  APOE  genetic testing shows E3/E4 variant which is associated with a slight increase risk of development of late-onset Alzheimer's Disease compared to the general population. This lab does not diagnose of Alzheimer's Disease.    11/02/2023  11:33 AM  MOCA  Trails 1  Cube 0  Clock 1  Naming 2  Digit Span 2  Letter A 1  Serial 7s 3  Sentence Repetition 1  Fluency 0  Abstraction 1  Orientation 5  Memory 1  Education level 0  Total Score 18   MMSE: Score 26/30.  Minicog Recall 1 Clock 0 Total 1  ROS   Review of systems is somewhat limited by paroxysms of pain but reviewed as able  Past History   Past Medical History:  Diagnosis Date   Bilateral carpal tunnel syndrome    Chronic bilateral low back pain without sciatica    CKD (chronic kidney disease) stage 3, GFR 30-59 ml/min (HCC)    Dementia (HCC)    DM (diabetes mellitus), type 1 (HCC)    ED (erectile dysfunction)    Hypothyroidism    Neuropathy    Prostate cancer (HCC)    Thyroid disease     Past Surgical History:  Procedure Laterality Date   APPENDECTOMY     BICEPT TENODESIS Right 07/14/2021   Procedure: BICEPS TENODESIS;  Surgeon: Marchia Drivers, MD;  Location: [facility];  Service: Orthopedics;  Laterality: Right;   CARPAL TUNNEL RELEASE Right 07/12/2023   Procedure: RIGHT CARPAL TUNNEL RELEASE WITH ULTRASOUND GUIDANCE;  Surgeon: Claudene Penne ORN, MD;  Location: [facility];  Service: Neurosurgery;  Laterality: Right;   CARPAL TUNNEL RELEASE Left 08/09/2023   Procedure: LEFT CARPAL TUNNEL RELEASE WITH ULTRASOUND GUIDANCE;  Surgeon: Claudene Penne ORN, MD;  Location: [facility];  Service: Neurosurgery;  Laterality: Left;   FOOT SURGERY Left    LUMBAR FUSION     PROSTATE CRYOABLATION  2010   SHOULDER ARTHROSCOPY WITH OPEN ROTATOR CUFF REPAIR AND DISTAL CLAVICLE ACROMINECTOMY Right 07/14/2021   Procedure: SHOULDER ARTHROSCOPY WITH OPEN ROTATOR CUFF REPAIR AND DISTAL CLAVICLE ACROMINECTOMY;  Surgeon: Marchia Drivers, MD;   Location: [facility];  Service: Orthopedics;  Laterality: Right;   shoulder blade surgery  2002    Family History: Family History  Problem Relation Age of Onset   Diabetes Sister    Prostate cancer Neg Hx    Bladder Cancer Neg Hx    Kidney cancer Neg Hx     Social History  reports that he has never smoked. He has never used smokeless tobacco. He reports that he does not currently use alcohol. He reports that he does not use drugs.  Allergies  Allergen Reactions   Armoracia Rusticana Ext (Horseradish) Itching, Nausea And Vomiting, Nausea Only and Swelling   Tree Extract Itching, Anaphylaxis and Shortness Of Breath    Walnuts-Throat closes up   [facility] Anaphylaxis, Shortness Of Breath, Itching, Nausea And Vomiting, Nausea Only and Swelling   Bactrim  [Sulfamethoxazole -Trimethoprim ] Rash    Occurred July 2025    Medications   Current Facility-Administered Medications:    acetaminophen  (TYLENOL ) tablet 650 mg, 650 mg, Oral,  Q6H PRN, Niu, Xilin, MD   dextrose  50 % solution 50 mL, 50 mL, Intravenous, PRN, Niu, Xilin, MD   diphenhydrAMINE  (BENADRYL ) injection 12.5 mg, 12.5 mg, Intravenous, Q8H PRN, Niu, Xilin, MD   enoxaparin  (LOVENOX ) injection 40 mg, 40 mg, Subcutaneous, Q24H, Niu, Xilin, MD, 40 mg at 31-Jan-2024 2225   insulin  aspart (novoLOG ) injection 0-5 Units, 0-5 Units, Subcutaneous, QHS, Niu, Xilin, MD, 4 Units at 01/31/24 2223   insulin  aspart (novoLOG ) injection 0-9 Units, 0-9 Units, Subcutaneous, TID WC, Niu, Xilin, MD, 5 Units at 31-Jan-2024 1818   insulin  glargine-yfgn (SEMGLEE ) injection 10 Units, 10 Units, Subcutaneous, QHS, Ponnala, Shruthi, MD, 10 Units at 2024-01-31 2226   lactated ringers  infusion, , Intravenous, Continuous, Ponnala, Shruthi, MD, Last Rate: 125 mL/hr at 01/24/24 0439, New Bag at 01/24/24 0439   levothyroxine  (SYNTHROID ) tablet 112 mcg, 112 mcg, Oral, Q0600, Niu, Xilin, MD, 112 mcg at 01/24/24 0543   LORazepam  (ATIVAN ) tablet 0.5 mg, 0.5 mg, Oral, Q4H PRN,  Mansy, Jan A, MD, 0.5 mg at 01/31/2024 1416   methocarbamol  (ROBAXIN ) tablet 500 mg, 500 mg, Oral, Q8H PRN, Niu, Xilin, MD, 500 mg at 01/22/24 1341   morphine  (PF) 2 MG/ML injection 2 mg, 2 mg, Intravenous, Q4H PRN, Niu, Xilin, MD, 2 mg at 01/22/24 1233   oxyCODONE -acetaminophen  (PERCOCET/ROXICET) 5-325 MG per tablet 1 tablet, 1 tablet, Oral, Q4H PRN, Niu, Xilin, MD, 1 tablet at 01/21/24 2209   pregabalin  (LYRICA ) capsule 25 mg, 25 mg, Oral, BID, Niu, Xilin, MD, 25 mg at 2024/01/31 2227   vancomycin  (VANCOCIN ) capsule 125 mg, 125 mg, Oral, QID, Ponnala, Shruthi, MD  Vitals   Vitals:   January 31, 2024 1946 31-Jan-2024 2321 01/24/24 0335 01/24/24 0835  BP: 130/79 132/77 (!) 142/85 (!) 148/95  Pulse: 70 69 66 69  Resp: 18 20 18    Temp: 97.7 F (36.5 C) 98 F (36.7 C) 97.9 F (36.6 C) 98 F (36.7 C)  TempSrc: Oral     SpO2: 96% 94% 94% 97%  Weight:      Height:        Body mass index is 24.01 kg/m.   Physical Exam   Constitutional: Appears well-developed and well-nourished.  Paroxysms of pain Psych: Affect cooperative despite pain Eyes: No scleral injection.  HENT: No OP obstruction.  Head: Normocephalic.  Cardiovascular: Normal rate and regular rhythm.  Respiratory: Effort normal, non-labored breathing.  GI: Soft.  No distension. There is no tenderness.  Skin: WDI.   Neurologic Examination   Mental status: Awake, alert, appropriately conversant, at times slightly confused on timeline/details of symptoms Cranial nerves: Face symmetric, EOMI, tongue midline, hearing intact to voice Motor: 5/5 strength including finger extension and flexion, and throughout bilateral lower extremities except foot dorsiflexion and plantarflexion range of motion appear to be pain limited--he does have somewhat pain limited movement of the left foot as documented on my last examination.  Of note he reports this is worsened from a few days ago (at which time felt symptoms have been fully resolved), but certainly  not as bad as when he was last hospitalized, although he reports the pain is worse now  He is having paroxysms of pain which are partially improved with a foot dorsiflexion stretch  Labs/Imaging/Neurodiagnostic studies   CBC:  Recent Labs  Lab 01-31-24 0358 01/24/24 0337  WBC 6.6 5.8  HGB 10.4* 10.4*  HCT 32.0* 31.5*  MCV 90.4 88.2  PLT 155 188   Basic Metabolic Panel:  Lab Results  Component Value Date  NA 135 01/24/2024   K 4.1 01/24/2024   CO2 24 01/24/2024   GLUCOSE 304 (H) 01/24/2024   BUN 18 01/24/2024   CREATININE 1.35 (H) 01/24/2024   CALCIUM 8.0 (L) 01/24/2024   GFRNONAA 56 (L) 01/24/2024   GFRAA >60 04/28/2019   Lipid Panel: No results found for: LDLCALC HgbA1c:  Lab Results  Component Value Date   HGBA1C 7.6 (H) 01/04/2024    MR Brain (Personally reviewed): no acute intracranial process   MRI C-spine (Personally reviewed):  1. Multilevel cervical spondylosis with resultant mild spinal stenosis at C5-6 and C6-7. 2. Multifactorial degenerative changes with resultant multilevel foraminal narrowing as above. Notable findings include moderate left C3 and C4 foraminal stenosis, mild bilateral C6 foraminal narrowing, with mild to moderate bilateral C7 foraminal stenosis. 3. Right foraminal disc protrusion at T1-2. The exiting right T1 nerve root could be affected.  Neurodiagnostics rEEG:  This study is within normal limits. No seizures or epileptiform discharges were seen throughout the recording. A normal interictal EEG does not exclude the diagnosis of epilepsy.    ASSESSMENT   EXODUS KUTZER is a 73 y.o. male presenting with current recurrent pain.  This does seem to be neuropathic in nature, again potentially multifactorial given prior nerve injury, C-spine findings (though none of these seem to be acute enough that I feel inpatient neurosurgical consultation would be needed, especially given preserved strength, may continue to follow-up  outpatient), and potentially hypersensitivity to pain due to an autoimmune condition (highly positive gad 65 antibody in the past).  However with his ongoing concern for infection would not pursue aggressive immunosuppression treatments but continue to treat pain symptomatically.  Improvement of his underlying medical condition (C. difficile infection) may also improve his pain  RECOMMENDATIONS  -Continue Lyrica  25 mg twice daily, uptitrate as able -Start carbamazepine  200 mg twice daily -Expect continued gradual improvement -Inpatient neurology will sign off at this time but please do not hesitate to reach out if new questions or concerns arise -Continued outpatient follow-up with Dr. Maree ______________________________________________________________________   Lola Jernigan MD-PhD [facility] [phone] [facility] coverage for [facility] is from 8 AM to 4 AM in-house and 4 PM to 8 PM by telephone/video. 8 PM to 8 AM emergent questions or overnight urgent questions should be addressed to Teleneurology On-call or Jol[facility] Pack neurohospitalist; contact information can be found on AMION

## 2024-01-24 NOTE — Progress Notes (Signed)
 PROGRESS NOTE    NIKOLA MARONE  FMW:[id number] DOB: 02/03/1951 DOA: 01/21/2024 PCP: Lenon Layman ORN, MD  Chief Complaint  Patient presents with   Altered Mental Status    Hospital Course:  Jared Hess is a 73 y.o. male with medical history significant of type-I DM, hypothyroidism, dementia, CKD-3, prostate cancer, left limb regional pain syndrome (his wife denies this diagnosis), chronic back pain, who presents with hallucination, nausea, vomiting, abdominal pain. Admitted for C.diff colitis. Also had jerkly movements in the LUE and LLE, Seen by Neurology    Patient was recently hospitalized from 7/25 - 7/30 due to left ankle cellulitis and wound, recently completed Augmentin  Subjective: Patient was examined at the bedside. Spouse present at bedside States hallucinations are resolved.  Continues to have intermittent shooting pain in LUE and LLE Diarrhea improving, discontinue IV fluids   Objective: Vitals:   01/23/24 2321 01/24/24 0335 01/24/24 0835 01/24/24 1207  BP: 132/77 (!) 142/85 (!) 148/95 129/89  Pulse: 69 66 69 77  Resp: 20 18    Temp: 98 F (36.7 C) 97.9 F (36.6 C) 98 F (36.7 C) 97.9 F (36.6 C)  TempSrc:    Oral  SpO2: 94% 94% 97% 94%  Weight:      Height:        Intake/Output Summary (Last 24 hours) at 01/24/2024 1604 Last data filed at 01/24/2024 1300 Gross per 24 hour  Intake 1154.45 ml  Output 1250 ml  Net -95.55 ml   Filed Weights   01/21/24 1422  Weight: 82.6 kg    Examination: General: Not in acute distress Cardiac: S1/S2, RRR, No murmurs, No gallops or rubs. Respiratory: No rales, wheezing, rhonchi or rubs. GI: Soft, nondistended, has left lower abdominal tenderness, no rebound pain, no organomegaly, BS present. GU: No hematuria Ext: No pitting leg edema bilaterally. 1+DP/PT pulse bilaterally. Musculoskeletal: No joint deformities, No joint redness or warmth, no limitation of ROM in spin. Skin: No rashes.  Neuro: Alert,  oriented X3, cranial nerves II-XII grossly intact,  Has shooting pain in left ankle and left lower leg, and also in left hand, with jerk in left leg and left hand. Psych: No hallucination.  No suicidal or hemocidal ideation  Assessment & Plan:  Principal Problem:   C. 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 difficile colitis Patient has positive C. difficile antigen and toxin, Afebrile, leukocytosis resolved. No prior episodes of C.diff - Lactic acid normal 1.4 --> 1.4.  Initially hypotensive which responded to IV fluid - CT of abdomen/pelvis negative for acute intra-abdominal issues - Bcx x 2 NGTD - Oral vancomycin  125 mg every 6 hours. Discontinue IV Flagyl  - As needed Zofran  - As needed morphine , Percocet, Tylenol  for pain - discontinue IV fluids   Left ankle and hand pain and Jerking - has shooting pain in the left ankle, left lower leg, associated with jerky of left leg.  Initially patient did not have issues with upper extremity, but he developed shooting pain in left hand in ED. etiology is not clear. left limb regional pain syndrome is on his medical problem list, but his wife denies this diagnosis.  Patient was given 2 mg Ativan  without improvement.  Differential diagnosis include psychosis, spinal stenosis, seizure - Reports have stiffness in both upper  extremities which resolved with change in position - MRI brain negative for acute abnormalities - MRI C-spine Multilevel cervical spondylosis spinal stenosis at C5-C6 and C6-C7 multilevel foraminal narrowing.  Right foraminal disc protrusion T1-2.  Right T1 nerve root could be affected - EEG negative - symptoms improve with As needed Ativan , dec to 0.25 q4 prn - Seen by Neurology,appreciate recs - Continue Lyrica  25mg  bid, uptitrate as able - Start  Carbamazepine  200mg  bid,. 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  Per report, patient had a CBG 50.  Patient is taking Lantus  and lispro at home - Inc Lantus  15u due to hyperglycemia (takes 20u at home), po intake not back to baseline - SSI  Hypophosphatemia Hypomagnesemia - Monitor and replete as needed   CKD stage 3a, GFR 45-59 ml/min (HCC): Stable -Follow-up BMP   Hypothyroidism - Synthroid    Dementia (HCC) -fall precaution   DVT prophylaxis: Lovenox  SQ   Code Status: Full Code Disposition:  pending  Consultants:  Neurology  Procedures:  None  Antimicrobials:  Anti-infectives (From admission, onward)    Start     Dose/Rate Route Frequency Ordered Stop   01/24/24 1000  vancomycin  (VANCOCIN ) capsule 125 mg        125 mg Oral 4 times daily 01/24/24 0759 02/03/24 0959   01/21/24 2045  vancomycin  (VANCOCIN ) capsule 500 mg  Status:  Discontinued        500 mg Oral Every 6 hours 01/21/24 2030 01/24/24 0759   01/21/24 2045  metroNIDAZOLE  (FLAGYL ) IVPB 500 mg  Status:  Discontinued        500 mg 100 mL/hr over 60 Minutes Intravenous Every 8 hours 01/21/24 2030 01/23/24 0906       Data Reviewed: I have personally reviewed following labs and imaging studies CBC: Recent Labs  Lab 01/21/24 1422 01/22/24 0554 01/23/24 0358 01/24/24 0337  WBC 13.0* 8.3 6.6 5.8  HGB 12.7* 9.4* 10.4* 10.4*  HCT 37.8* 28.2* 32.0* 31.5*  MCV 88.5 89.8 90.4 88.2  PLT 200 136* 155 188   Basic Metabolic Panel: Recent Labs  Lab 01/21/24 1422 01/22/24 0554 01/23/24 0358 01/24/24 0337  NA 133* 134* 134* 135  K 4.0 3.7 4.0 4.1  CL 100 104 104 101  CO2 24 22 23 24   GLUCOSE 100* 107* 282*  304*  BUN 27* 22 21 18   CREATININE 1.42* 1.16 1.59* 1.35*  CALCIUM 8.4* 7.4* 7.7* 8.0*  MG 2.0  --  1.7 1.9  PHOS 2.2*  --  2.4* 2.7   GFR: Estimated Creatinine Clearance: 56.7 mL/min (A) (by C-G formula based on SCr of 1.35 mg/dL (H)). Liver Function Tests: Recent Labs  Lab 01/21/24 1422  AST 24  ALT 22  ALKPHOS 101  BILITOT 1.2  PROT 6.7  ALBUMIN 3.7   CBG: Recent Labs  Lab 01/23/24 1243 01/23/24 1609 01/23/24 2123 01/24/24 0832 01/24/24 1230  GLUCAP 221* 283* 303* 304* 189*    Recent Results (from the past 240 hours)  Blood culture (routine x 2)     Status: None (Preliminary result)   Collection Time: 01/21/24  4:52 PM   Specimen: BLOOD  Result Value Ref Range Status  Specimen Description BLOOD BLOOD LEFT FOREARM  Final   Special Requests   Final    BOTTLES DRAWN AEROBIC AND ANAEROBIC Blood Culture adequate volume   Culture   Final    NO GROWTH 2 DAYS Performed at [facility], [address] [place], [place] [address]    Report Status PENDING  Incomplete  Blood culture (routine x 2)     Status: None (Preliminary result)   Collection Time: 01/21/24  4:52 PM   Specimen: BLOOD  Result Value Ref Range Status   Specimen Description BLOOD LEFT ANTECUBITAL  Final   Special Requests   Final    BOTTLES DRAWN AEROBIC AND ANAEROBIC Blood Culture adequate volume   Culture   Final    NO GROWTH 2 DAYS Performed at [facility], [address] [place], [place] [address]    Report Status PENDING  Incomplete  C Difficile Quick Screen w PCR reflex     Status: Abnormal   Collection Time: 01/21/24  7:20 PM   Specimen: STOOL  Result Value Ref Range Status   C Diff antigen POSITIVE (A) NEGATIVE Final   C Diff toxin POSITIVE (A) NEGATIVE Final   C Diff interpretation Toxin producing C. difficile detected.  Final    Comment: CRITICAL RESULT CALLED TO, READ BACK BY AND VERIFIED WITH: CRYSTAL GUALDONI RN 01/21/24 @ 2024 KKG Performed at [facility], [address] [place], [place] [address]   Gastrointestinal Panel by PCR , Stool     Status: None   Collection Time: 01/21/24  7:20 PM   Specimen: STOOL  Result Value Ref Range Status   Campylobacter species NOT DETECTED NOT DETECTED Final   Plesimonas shigelloides NOT DETECTED NOT DETECTED Final   Salmonella species NOT DETECTED NOT DETECTED Final   Yersinia enterocolitica NOT DETECTED NOT DETECTED Final   Vibrio species NOT DETECTED NOT DETECTED Final   Vibrio cholerae NOT DETECTED NOT DETECTED Final   Enteroaggregative E coli (EAEC) NOT DETECTED NOT DETECTED Final   Enteropathogenic E coli (EPEC) NOT DETECTED NOT DETECTED Final   Enterotoxigenic E coli (ETEC) NOT DETECTED NOT DETECTED Final   Shiga like toxin producing E coli (STEC) NOT DETECTED NOT DETECTED Final   Shigella/Enteroinvasive E coli (EIEC) NOT DETECTED NOT DETECTED Final   Cryptosporidium NOT DETECTED NOT DETECTED Final   Cyclospora cayetanensis NOT DETECTED NOT DETECTED Final   Entamoeba histolytica NOT DETECTED NOT DETECTED Final   Giardia lamblia NOT DETECTED NOT DETECTED Final   Adenovirus F40/41 NOT DETECTED NOT DETECTED Final   Astrovirus NOT DETECTED NOT DETECTED Final   Norovirus GI/GII NOT DETECTED NOT DETECTED Final   Rotavirus A NOT DETECTED NOT DETECTED Final   Sapovirus (I, II, IV, and V) NOT DETECTED NOT DETECTED Final    Comment: Performed at [facility], [address], [place], [place] [address]     Radiology Studies: EEG adult Result Date: 01/24/2024 Shelton Arlin KIDD, MD     01/24/2024  2:25 PM Patient Name: CHAS AXEL MRN: [id number] Epilepsy Attending: Arlin KIDD Shelton Referring Physician/Provider: Niu, Xilin, MD Date: 01/23/2024 Duration: 30.36 mins Patient history: 73yo M is admitted for C. difficile colitis.  Has hallucination.  Has left ankle and left lower leg pain, also has left hand pain, with jerking. EEG to evaluate for seizure Level of alertness: Awake,  asleep AEDs during EEG study: PGB Technical aspects: This EEG study was done with scalp electrodes positioned according to the 10-20 International system of  electrode placement. Electrical activity was reviewed with band pass filter of 1-70Hz , sensitivity of 7 uV/mm, display speed of 12mm/sec with a 60Hz  notched filter applied as appropriate. EEG data were recorded continuously and digitally stored.  Video monitoring was available and reviewed as appropriate. Description: The posterior dominant rhythm consists of 8.5Hz  activity of moderate voltage (25-35 uV) seen predominantly in posterior head regions, symmetric and reactive to eye opening and eye closing. Sleep was characterized by vertex waves, sleep spindles (12 to 14 Hz), maximal frontocentral region. Hyperventilation and photic stimulation were not performed.   IMPRESSION: This study is within normal limits. No seizures or epileptiform discharges were seen throughout the recording. A normal interictal EEG does not exclude the diagnosis of epilepsy. Priyanka O Yadav    Scheduled Meds:  carbamazepine   200 mg Oral BID   enoxaparin  (LOVENOX ) injection  40 mg Subcutaneous Q24H   insulin  aspart  0-5 Units Subcutaneous QHS   insulin  aspart  0-9 Units Subcutaneous TID WC   insulin  glargine-yfgn  15 Units Subcutaneous QHS   levothyroxine   112 mcg Oral Q0600   pregabalin   25 mg Oral BID   vancomycin   125 mg Oral QID   Continuous Infusions:     LOS: 3 days  MDM: Patient is high risk for one or more organ failure.  They necessitate ongoing hospitalization for continued IV therapies and subsequent lab monitoring. Total time spent interpreting labs and vitals, reviewing the medical record, coordinating care amongst consultants and care team members, directly assessing and discussing care with the patient and/or family: 55 min  Laree Lock, MD [facility]  To contact the attending physician between 7A-7P please use Epic Chat. To contact the  covering physician during after hours 7P-7A, please review Amion.  01/24/2024, 4:04 PM   *This document has been created with the assistance of dictation software. Please excuse typographical errors. *

## 2024-01-24 NOTE — TOC Initial Note (Signed)
 Transition of Care [facility]) - Initial/Assessment Note    Patient Details  Name: Jared Hess MRN: [id number] Date of Birth: [birth year]  [facility]) CM/SW Contact:    Lauraine JAYSON Carpen, LCSW Phone Number: 01/24/2024, 3:17 PM  Clinical Narrative:   Readmission prevention screen complete. CSW met with patient. Wife at bedside but she was on a call. CSW introduced role and explained that discharge planning would be discussed. PCP is Layman Piety, MD. Patient drives himself to appointments. [facility] [address]. No issues obtaining medications. Patient lives home with his wife. No home health or DME use prior to admission. No further concerns. CSW will continue to follow patient for support and facilitate return home once stable. His wife will transport him home at discharge.              Expected Discharge Plan: Home/Self Care Barriers to Discharge: Continued Medical Work up   Patient Goals and CMS Choice            Expected Discharge Plan and Services       Living arrangements for the past 2 months: Single Family Home                                      Prior Living Arrangements/Services Living arrangements for the past 2 months: Single Family Home Lives with:: Spouse Patient language and need for interpreter reviewed:: Yes Do you feel safe going back to the place where you live?: Yes      Need for Family Participation in Patient Care: Yes (Comment) Care giver support system in place?: Yes (comment)   Criminal Activity/Legal Involvement Pertinent to Current Situation/Hospitalization: No - Comment as needed  Activities of Daily Living   ADL Screening (condition at time of admission) Independently performs ADLs?: Yes (appropriate for developmental age) Is the patient deaf or have difficulty hearing?: No Does the patient have difficulty seeing, even when wearing glasses/contacts?: No Does the patient have difficulty concentrating,  remembering, or making decisions?: No  Permission Sought/Granted                  Emotional Assessment Appearance:: Appears stated age Attitude/Demeanor/Rapport: Engaged, Gracious Affect (typically observed): Accepting, Appropriate, Calm, Pleasant Orientation: : Oriented to Self, Oriented to Place, Oriented to  Time, Oriented to Situation Alcohol / Substance Use: Not Applicable Psych Involvement: No (comment)  Admission diagnosis:  C. difficile colitis [A04.72] Patient Active Problem List   Diagnosis Date Noted   Jerking 01/22/2024   C. difficile colitis 01/21/2024   Hallucination 01/21/2024   Left ankle pain 01/21/2024   Complex regional pain syndrome i of left lower limb 01/10/2024   Enterococcus faecalis infection 01/09/2024   Cellulitis 01/06/2024   CKD stage 3a, GFR 45-59 ml/min (HCC) 01/06/2024   AKI (acute kidney injury) (HCC) 01/06/2024   Rash 01/06/2024   Hypothyroidism 01/06/2024   Wound infection 01/04/2024   Cellulitis of left ankle 01/04/2024   Open wound of left lower leg 01/03/2024   Bilateral carpal tunnel syndrome 10/19/2023   Left carpal tunnel syndrome 08/09/2023   Right carpal tunnel syndrome 06/27/2023   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 [place] [address] Phone: [phone] Fax: [phone]     Social Drivers of Health (SDOH) Social History: SDOH Screenings   Food  Insecurity: No Food Insecurity (01/22/2024)  Housing: Low Risk  (01/22/2024)  Transportation Needs: No Transportation Needs (01/22/2024)  Utilities: Not At Risk (01/22/2024)  Financial Resource Strain: Low Risk  (02/22/2023)   Received from [facility]  Social Connections: Unknown (01/22/2024)  Tobacco Use: Low Risk  (01/21/2024)   SDOH Interventions:     Readmission Risk Interventions    01/24/2024    3:16 PM  Readmission Risk Prevention Plan  Transportation Screening Complete  PCP or Specialist Appt within 3-5 Days Complete  Social Work Consult for Recovery Care Planning/Counseling Complete  Palliative Care Screening Not Applicable  Medication Review [facility]) Complete

## 2024-01-24 NOTE — Plan of Care (Signed)
  Problem: Education: Goal: Knowledge of General Education information will improve Description Including pain rating scale, medication(s)/side effects and non-pharmacologic comfort measures Outcome: Progressing   Problem: Health Behavior/Discharge Planning: Goal: Ability to manage health-related needs will improve Outcome: Progressing   Problem: Nutrition: Goal: Adequate nutrition will be maintained Outcome: Progressing   Problem: Elimination: Goal: Will not experience complications related to bowel motility Outcome: Progressing   

## 2024-01-25 DIAGNOSIS — A0472 Enterocolitis due to Clostridium difficile, not specified as recurrent: Secondary | ICD-10-CM | POA: Diagnosis not present

## 2024-01-25 LAB — MAGNESIUM: Magnesium: 1.7 mg/dL (ref 1.7–2.4)

## 2024-01-25 LAB — BASIC METABOLIC PANEL WITH GFR
Anion gap: 9 (ref 5–15)
BUN: 15 mg/dL (ref 8–23)
CO2: 27 mmol/L (ref 22–32)
Calcium: 8.3 mg/dL — ABNORMAL LOW (ref 8.9–10.3)
Chloride: 100 mmol/L (ref 98–111)
Creatinine, Ser: 1.39 mg/dL — ABNORMAL HIGH (ref 0.61–1.24)
GFR, Estimated: 54 mL/min — ABNORMAL LOW (ref >60)
Glucose, Bld: 289 mg/dL — ABNORMAL HIGH (ref 70–99)
Potassium: 4.1 mmol/L (ref 3.5–5.1)
Sodium: 136 mmol/L (ref 135–145)

## 2024-01-25 LAB — GLUCOSE, CAPILLARY: Glucose-Capillary: 274 mg/dL — ABNORMAL HIGH (ref 70–99)

## 2024-01-25 MED ORDER — INSULIN GLARGINE-YFGN 100 UNIT/ML ~~LOC~~ SOLN
20.0000 [IU] | Freq: Every day | SUBCUTANEOUS | Status: DC
  Filled 2024-01-25: qty 0.2

## 2024-01-25 MED ORDER — VANCOMYCIN HCL 125 MG PO CAPS: 125.0000 mg | ORAL_CAPSULE | Freq: Four times a day (QID) | ORAL | 0 refills | Status: AC

## 2024-01-25 MED ORDER — OXYCODONE-ACETAMINOPHEN 5-325 MG PO TABS: 1.0000 | ORAL_TABLET | Freq: Three times a day (TID) | ORAL | 0 refills | Status: AC | PRN

## 2024-01-25 MED ORDER — INSULIN GLARGINE-YFGN 100 UNIT/ML ~~LOC~~ SOLN: 18.0000 [IU] | Freq: Every day | SUBCUTANEOUS | Status: DC

## 2024-01-25 MED ORDER — INSULIN ASPART 100 UNIT/ML IJ SOLN: 2.0000 [IU] | Freq: Three times a day (TID) | INTRAMUSCULAR | Status: DC

## 2024-01-25 MED ORDER — PREGABALIN 25 MG PO CAPS
25.0000 mg | ORAL_CAPSULE | Freq: Two times a day (BID) | ORAL | 0 refills | Status: AC
Start: 2024-01-25 — End: ?

## 2024-01-25 MED ORDER — CARBAMAZEPINE 200 MG PO TABS: 200.0000 mg | ORAL_TABLET | Freq: Two times a day (BID) | ORAL | 0 refills | Status: AC

## 2024-01-25 NOTE — Plan of Care (Signed)

## 2024-01-25 NOTE — Discharge Summary (Signed)
 Physician Discharge Summary   Patient: Jared Hess MRN: [id number] DOB: 25-May-1951  Admit date:     01/21/2024  Discharge date: 01/25/24  Discharge Physician: Leita Blanch   PCP: Lenon Layman ORN, MD   Recommendations at discharge:    Follow-up PCP in 1 to 2 weeks  Discharge Diagnoses: Principal Problem:   C. 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 male with medical history significant of type-I DM, hypothyroidism, dementia, CKD-3, prostate cancer, left limb regional pain syndrome (his wife denies this diagnosis), chronic back pain, who presents with hallucination, nausea, vomiting, abdominal pain. Admitted for C.diff colitis. Also had jerkly movements in the LUE and LLE, Seen by Neurology    Patient was recently hospitalized from 7/25 - 7/30 due to left ankle cellulitis and wound, recently completed Augmentin  C. difficile colitis Patient has positive C. difficile antigen and toxin, Afebrile, leukocytosis resolved. No prior episodes of C.diff - Lactic acid normal 1.4 --> 1.4.  Initially hypotensive which responded to IV fluid - CT of abdomen/pelvis negative for acute intra-abdominal issues - Bcx x 2 NGTD - Oral vancomycin  125 mg every 6 hours. For 10 days. No diarrhea   Left ankle and hand pain and Jerking - has shooting pain in the left ankle, left lower leg, associated with jerky of left leg.   - Reports have stiffness in both upper extremities which resolved with change in position - MRI brain negative for acute abnormalities - MRI C-spine Multilevel cervical spondylosis spinal stenosis at C5-C6 and C6-C7 multilevel foraminal narrowing.  Right foraminal disc protrusion T1-2.  Right T1 nerve root could be affected - EEG negative - Seen by Neurology,appreciate recs -  Continue Lyrica  25mg  bid, uptitrate as able - Start Carbamazepine  200mg  bid,. 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  Per report, patient had a CBG 50.  Patient is taking Lantus  and lispro at home--resumed - SSI   Hypophosphatemia Hypomagnesemia - Monitor and replete as needed   CKD stage 3a, GFR 45-59 ml/min (HCC): Stable -Follow-up BMP   Hypothyroidism - Synthroid    Dementia (HCC) -fall precaution  Overall patient feels back to baseline. Tolerating regular diet. Discharge plan discussed with patient's wife on the phone.        Consultants: neurology Procedures performed: none Disposition: Home Diet recommendation:  Discharge Diet Orders (From admission, onward)     Start     Ordered   01/25/24 0000  Diet Carb Modified        01/25/24 1021           Cardiac diet DISCHARGE MEDICATION: Allergies as of 01/25/2024       Reactions   Armoracia Rusticana Ext (horseradish) Itching, Nausea And Vomiting, Nausea Only, Swelling   Tree Extract Itching, Anaphylaxis, Shortness Of Breath   Walnuts-Throat closes up   [facility] Anaphylaxis, Shortness Of Breath, Itching, Nausea And Vomiting, Nausea Only, Swelling   Bactrim  [sulfamethoxazole -trimethoprim ] Rash   Occurred July 2025        Medication List     STOP  taking these medications    gabapentin  300 MG capsule Commonly known as: Neurontin        TAKE these medications    acetaminophen  325 MG tablet Commonly known as: TYLENOL  Take 2 tablets (650 mg total) by mouth every 6 (six) hours as needed for mild pain (pain score 1-3) or fever (or Fever >/= 101).   Admelog SoloStar 100 UNIT/ML KwikPen Generic drug: insulin  lispro Inject 0-6 Units into the skin with breakfast, with lunch, and with evening  meal. Sliding scale   BD Pen Needle Nano Ultrafine 32G X 4 MM Misc Generic drug: Insulin  Pen Needle 1 each by Other route.  Use 1 each 4 (four) times daily   carbamazepine  200 MG tablet Commonly known as: TEGRETOL  Take 1 tablet (200 mg total) by mouth 2 (two) times daily.   Lantus  SoloStar 100 UNIT/ML Solostar Pen Generic drug: insulin  glargine INJECT 20 UNITS SUBCUTANEOUSLY AT BEDTIME   levothyroxine  112 MCG tablet Commonly known as: SYNTHROID  Take 112 mcg by mouth daily before breakfast.   oxyCODONE -acetaminophen  5-325 MG tablet Commonly known as: PERCOCET/ROXICET Take 1 tablet by mouth every 8 (eight) hours as needed for moderate pain (pain score 4-6).   pregabalin  25 MG capsule Commonly known as: LYRICA  Take 1 capsule (25 mg total) by mouth 2 (two) times daily.   vancomycin  125 MG capsule Commonly known as: VANCOCIN  Take 1 capsule (125 mg total) by mouth 4 (four) times daily for 35 doses.   Vitamin B-12 2500 MCG Subl Place 2,500 mcg under the tongue every other day.        Follow-up Information     Lenon Layman ORN, MD. Schedule an appointment as soon as possible for a visit in 1 week(s).   Specialty: Internal Medicine Contact information: [address] [facility] [place] [address] [phone]                Discharge Exam: Fredricka Weights   01/21/24 1422  Weight: 82.6 kg   Alert and oriented times three respiratory clear to auscultation cardiovascular both heart sounds are normal, no respiratory distress abdomen is soft benign nontender   Condition at discharge: fair  The results of significant diagnostics from this hospitalization (including imaging, microbiology, ancillary and laboratory) are listed below for reference.   Imaging Studies: EEG adult Result Date: 01/24/2024 Shelton Arlin KIDD, MD     01/24/2024  2:25 PM Patient Name: ALIJAH AKRAM MRN: [id number] Epilepsy Attending: Arlin KIDD Shelton Referring  Physician/Provider: Niu, Xilin, MD Date: 01/23/2024 Duration: 30.36 mins Patient history: 73yo M is admitted for C. difficile colitis.  Has hallucination.  Has left ankle and left lower leg pain, also has left hand pain, with jerking. EEG to evaluate for seizure Level of alertness: Awake, asleep AEDs during EEG study: PGB Technical aspects: This EEG study was done with scalp electrodes positioned according to the 10-20 International system of electrode placement. Electrical activity was reviewed with band pass filter of 1-70Hz , sensitivity of 7 uV/mm, display speed of 23mm/sec with a 60Hz  notched filter applied as appropriate. EEG data were recorded continuously and digitally stored.  Video monitoring was available and reviewed as appropriate. Description: The posterior dominant rhythm consists of 8.5Hz  activity of moderate voltage (25-35 uV) seen predominantly in posterior head regions, symmetric and reactive to eye opening and eye closing. Sleep was characterized by vertex waves, sleep spindles (12 to 14 Hz), maximal frontocentral region. Hyperventilation and photic stimulation were not performed.   IMPRESSION: This  study is within normal limits. No seizures or epileptiform discharges were seen throughout the recording. A normal interictal EEG does not exclude the diagnosis of epilepsy. Arlin MALVA Krebs   MR CERVICAL SPINE WO CONTRAST Result Date: 01/22/2024 CLINICAL DATA:  Initial evaluation for cervical radiculopathy. EXAM: MRI CERVICAL SPINE WITHOUT CONTRAST TECHNIQUE: Multiplanar, multisequence MR imaging of the cervical spine was performed. No intravenous contrast was administered. COMPARISON:  None Available. FINDINGS: Alignment: Mild dextroscoliosis with straightening of the normal cervical lordosis. No significant listhesis. Vertebrae: Vertebral body height maintained without acute or chronic fracture. Bone marrow signal intensity overall within normal limits. No worrisome osseous lesions. Scattered  degenerative reactive endplate changes present about the C3-4 through T1-2 interspaces. No other significant abnormal marrow edema. Cord: Normal signal and morphology. Posterior Fossa, vertebral arteries, paraspinal tissues: Unremarkable. Disc levels: C2-C3: Minimal disc bulge with uncovertebral spurring. Moderate left facet arthrosis. No spinal stenosis. Moderate left C3 foraminal narrowing. Right neural foramen remains patent. C3-C4: Advanced intervertebral disc space narrowing with diffuse disc osteophyte complex. Posterior component flattens and indents the ventral thecal sac. Superimposed bilateral facet arthrosis with bony ankylosis on the right. No significant spinal stenosis. Moderate left C4 foraminal narrowing. Right neural foramen remains patent. C4-C5: Advanced intervertebral disc space narrowing with diffuse disc osteophyte complex. C4 and C5 vertebral bodies are partially ankylosed. Bilateral facet degeneration with ankylosis on the left. No significant spinal stenosis. Foramina remain patent. C5-C6: Advanced intervertebral disc space narrowing with diffuse disc osteophyte complex. Broad posterior component flattens and indents the ventral thecal sac, asymmetric to the left. Minimal cord flattening without cord signal changes. Mild spinal stenosis. Bilateral uncovertebral spurring with mild bilateral C6 foraminal stenosis. C6-C7: Degenerative intervertebral disc space narrowing with diffuse disc osteophyte complex. Broad posterior component flattens and indents the ventral thecal sac. Mild cord flattening without convincing cord signal changes. Superimposed ligament flavum hypertrophy. Mild spinal stenosis with mild to moderate bilateral C7 foraminal narrowing, slightly worse on the right. C7-T1: Degenerative disc space narrowing with diffuse disc osteophyte complex. Broad base right paracentral posterior component flattens and indents the ventral thecal sac. Minimal cord flattening without cord signal  changes. Mild facet hypertrophy. No significant spinal stenosis. Foramina remain patent. T1-2: Seen only on sagittal projection. Diffuse disc bulge with reactive endplate spurring. Superimposed right foraminal disc protrusion (series 5, image 4). No spinal stenosis. Moderate right foraminal narrowing. Left neural foramen remains patent. IMPRESSION: 1. Multilevel cervical spondylosis with resultant mild spinal stenosis at C5-6 and C6-7. 2. Multifactorial degenerative changes with resultant multilevel foraminal narrowing as above. Notable findings include moderate left C3 and C4 foraminal stenosis, mild bilateral C6 foraminal narrowing, with mild to moderate bilateral C7 foraminal stenosis. 3. Right foraminal disc protrusion at T1-2. The exiting right T1 nerve root could be affected. Electronically Signed   By: Morene Hoard M.D.   On: 01/22/2024 02:51   MR BRAIN WO CONTRAST Result Date: 01/22/2024 CLINICAL DATA:  Initial evaluation for acute psychosis. EXAM: MRI HEAD WITHOUT CONTRAST TECHNIQUE: Multiplanar, multiecho pulse sequences of the brain and surrounding structures were obtained without intravenous contrast. COMPARISON:  MRI from 07/30/2021 FINDINGS: Brain: Cerebral volume within normal limits. Scattered patchy T2/FLAIR hyperintensity involving the periventricular deep white matter both cerebral hemispheres, most characteristic of chronic microvascular ischemic disease, less than is typically seen for age. No abnormal foci of restricted diffusion to suggest acute or subacute ischemia. Gray-white matter differentiation maintained. No areas of chronic cortical infarction. No acute intracranial hemorrhage. Single chronic microhemorrhage noted within the right  temporal lobe, stable. No mass lesion, midline shift or mass effect. No hydrocephalus or extra-axial fluid collection. Pituitary gland and suprasellar region within normal limits. Vascular: Major intracranial vascular flow voids are maintained.  Skull and upper cervical spine: Craniocervical junction within normal limits. Bone marrow signal intensity within normal limits. No scalp soft tissue abnormality. Sinuses/Orbits: Globes normal soft tissues within normal limits. Scattered mucosal thickening present about the ethmoidal air cells and maxillary sinuses. No significant mastoid effusion. Other: None. IMPRESSION: Essentially normal brain MRI for age. No acute intracranial abnormality. Electronically Signed   By: Morene Hoard M.D.   On: 01/22/2024 02:43   CT ABDOMEN PELVIS W CONTRAST Result Date: 01/21/2024 CLINICAL DATA:  Diffuse abdominal tenderness and stool changes. EXAM: CT ABDOMEN AND PELVIS WITH CONTRAST TECHNIQUE: Multidetector CT imaging of the abdomen and pelvis was performed using the standard protocol following bolus administration of intravenous contrast. RADIATION DOSE REDUCTION: This exam was performed according to the departmental dose-optimization program which includes automated exposure control, adjustment of the mA and/or kV according to patient size and/or use of iterative reconstruction technique. CONTRAST:  OMNIPAQUE  IOHEXOL  300 MG/ML  SOLN COMPARISON:  None Available. FINDINGS: Lower chest: Mild subsegmental volume loss in the lung bases. Heart size normal. No pericardial effusion. No pleural effusion. Distal esophagus is grossly unremarkable. Hepatobiliary: Subcentimeter low-attenuation lesions in the liver, too small to characterize. No specific follow-up necessary. Liver and gallbladder are otherwise unremarkable. No biliary ductal dilatation. Pancreas: Negative. Spleen: Negative. Adrenals/Urinary Tract: Adrenal glands and kidneys are unremarkable. Ureters are decompressed. Bladder is grossly unremarkable. Stomach/Bowel: Stomach, small bowel, appendix and colon are unremarkable. Vascular/Lymphatic: Atherosclerotic calcification of the aorta. No pathologically enlarged lymph nodes. Reproductive: Prostate appears  atrophic or absent. Other: No free fluid.  Mesenteries and peritoneum are unremarkable. Musculoskeletal: Degenerative changes in the spine. Dextroconvex scoliosis. Likely old L1 compression fracture. IMPRESSION: 1. No acute findings. 2.  Aortic atherosclerosis (ICD10-I70.0). Electronically Signed   By: Newell Eke M.D.   On: 01/21/2024 17:26   US  ARTERIAL ABI (SCREENING LOWER EXTREMITY) Result Date: 01/08/2024 CLINICAL DATA:  Left ankle wound.  History of diabetes. EXAM: NONINVASIVE PHYSIOLOGIC VASCULAR STUDY OF BILATERAL LOWER EXTREMITIES TECHNIQUE: Evaluation of both lower extremities were performed at rest, including calculation of ankle-brachial indices with single level Doppler, pressure and pulse volume recording. COMPARISON:  None Available. FINDINGS: Right ABI:  1.06 Left ABI:  1.20 Right Lower Extremity:  Normal arterial waveforms at the ankle. Left Lower Extremity:  Normal arterial waveforms at the ankle. 1.0-1.4 Normal IMPRESSION: Normal resting ankle-brachial indices and distal waveforms. No evidence of significant arterial occlusive disease in either lower extremity. Electronically Signed   By: Marcey Moan M.D.   On: 01/08/2024 08:12   MR ANKLE LEFT W WO CONTRAST Result Date: 01/04/2024 CLINICAL DATA:  Soft tissue infection suspected, ankle, xray done History of diabetes and chronic kidney disease admitted with cellulitis/osteomyelitis/infected hardware of left ankle, failing outpatient management. Remote ankle surgery with draining medial ankle wound for 3 months. EXAM: MRI OF THE LEFT ANKLE WITHOUT AND WITH CONTRAST TECHNIQUE: Multiplanar, multisequence MR imaging of the ankle was performed before and after the administration of intravenous contrast. CONTRAST:  7.5mL GADAVIST  GADOBUTROL  1 MMOL/ML IV SOLN COMPARISON:  Radiographs 01/03/2024 and 01/01/2024. FINDINGS: Bones/Joint/Cartilage Multifocal susceptibility artifact within the calcaneal body and tuberosity, likely postsurgical or  posttraumatic in etiology. This is suboptimally visualized on the prior radiographs due to overlapping vascular clips posteriorly and medially. No evidence of acute fracture, dislocation or osteomyelitis.  No evidence of ankle joint effusion. Mild subtalar and talonavicular degenerative changes. Ligaments The medial and lateral ankle ligaments appear intact. Mild thickening of the anterior talofibular ligament, possibly related to remote injury. Muscles and Tendons The medial flexor, peroneal, anterior extensor and Achilles tendons are intact. No significant tenosynovitis or abnormal synovial enhancement. No focal muscular abnormalities are identified. Soft tissue In correlation with the prior radiographs, there are vascular clips posteromedially in the distal lower leg and ankle. There is medial subcutaneous edema with skin blistering superficial to the tarsal tunnel. There is mild subcutaneous enhancement in this area without organized fluid collection. There is scarring in Hoffa's fat. IMPRESSION: IMPRESSION 1. Medial subcutaneous edema with skin blistering superficial to the tarsal tunnel. There is mild subcutaneous enhancement in this area without organized fluid collection, consistent with cellulitis. 2. No evidence of deep soft tissue infection, osteomyelitis or septic arthritis. 3. Multifocal susceptibility artifact within the calcaneal body and tuberosity, likely postsurgical or posttraumatic in etiology. 4. The ankle tendons and ligaments appear intact. Electronically Signed   By: Elsie Perone M.D.   On: 01/04/2024 16:31   DG Ankle 2 Views Left Result Date: 01/03/2024 EXAM: 2 VIEW(S) XRAY OF THE LEFT ANKLE 01/03/2024 11:45:00 PM CLINICAL HISTORY: Eval for OM. Patient to ED via POV for wound to left inner ankle. Seen for same on Sunday- given antibiotic. Wound worsening. COMPARISON: None available. FINDINGS: BONES AND JOINTS: No acute fracture. No focal osseous lesion. No joint dislocation. SOFT  TISSUES: Surgical clips, presumably related to prior vein harvest. No radiopaque foreign body. IMPRESSION: 1. No acute osseous abnormality. 2. No radiopaque foreign body. Electronically signed by: Pinkie Pebbles MD 01/03/2024 11:55 PM EDT RP Workstation: [id number]   DG Ankle Complete Left Result Date: 01/01/2024 CLINICAL DATA:  Provided history: Wound on medial malleolus. Previous surgery and hardware. EXAM: LEFT ANKLE COMPLETE - 3+ VIEW COMPARISON:  None Available. FINDINGS: Multiple surgical clips project over the soft tissues posterior and medially. No surgical hardware is seen. No acute or healing/healed fracture. No erosions or periostitis. The ankle mortise is preserved. Small plantar calcaneal spur. No significant joint effusion. No soft tissue gas. Radiopaque foreign body. Site of wound is not delineated by radiograph. IMPRESSION: 1. No acute osseous abnormality. The site of wound is not well delineated by radiograph. 2. Multiple surgical clips project over the soft tissues posterior and medially. No surgical hardware is seen. Electronically Signed   By: Andrea Gasman M.D.   On: 01/01/2024 15:33    Microbiology: Results for orders placed or performed during the hospital encounter of 01/21/24  Blood culture (routine x 2)     Status: None (Preliminary result)   Collection Time: 01/21/24  4:52 PM   Specimen: BLOOD  Result Value Ref Range Status   Specimen Description BLOOD BLOOD LEFT FOREARM  Final   Special Requests   Final    BOTTLES DRAWN AEROBIC AND ANAEROBIC Blood Culture adequate volume   Culture   Final    NO GROWTH 4 DAYS Performed at [facility], [address], [place], [place] [address]    Report Status PENDING  Incomplete  Blood culture (routine x 2)     Status: None (Preliminary result)   Collection Time: 01/21/24  4:52 PM   Specimen: BLOOD  Result Value Ref Range Status   Specimen Description BLOOD LEFT ANTECUBITAL  Final   Special Requests   Final     BOTTLES DRAWN AEROBIC AND ANAEROBIC Blood Culture adequate volume   Culture  Final    NO GROWTH 4 DAYS Performed at [facility] [address] [place], [place] [address]    Report Status PENDING  Incomplete  C Difficile Quick Screen w PCR reflex     Status: Abnormal   Collection Time: 01/21/24  7:20 PM   Specimen: STOOL  Result Value Ref Range Status   C Diff antigen POSITIVE (A) NEGATIVE Final   C Diff toxin POSITIVE (A) NEGATIVE Final   C Diff interpretation Toxin producing C. difficile detected.  Final    Comment: CRITICAL RESULT CALLED TO, READ BACK BY AND VERIFIED WITH: CRYSTAL GUALDONI RN 01/21/24 @ 2024 KKG Performed at [facility], [address] [place], [place] [address]   Gastrointestinal Panel by PCR , Stool     Status: None   Collection Time: 01/21/24  7:20 PM   Specimen: STOOL  Result Value Ref Range Status   Campylobacter species NOT DETECTED NOT DETECTED Final   Plesimonas shigelloides NOT DETECTED NOT DETECTED Final   Salmonella species NOT DETECTED NOT DETECTED Final   Yersinia enterocolitica NOT DETECTED NOT DETECTED Final   Vibrio species NOT DETECTED NOT DETECTED Final   Vibrio cholerae NOT DETECTED NOT DETECTED Final   Enteroaggregative E coli (EAEC) NOT DETECTED NOT DETECTED Final   Enteropathogenic E coli (EPEC) NOT DETECTED NOT DETECTED Final   Enterotoxigenic E coli (ETEC) NOT DETECTED NOT DETECTED Final   Shiga like toxin producing E coli (STEC) NOT DETECTED NOT DETECTED Final   Shigella/Enteroinvasive E coli (EIEC) NOT DETECTED NOT DETECTED Final   Cryptosporidium NOT DETECTED NOT DETECTED Final   Cyclospora cayetanensis NOT DETECTED NOT DETECTED Final   Entamoeba histolytica NOT DETECTED NOT DETECTED Final   Giardia lamblia NOT DETECTED NOT DETECTED Final   Adenovirus F40/41 NOT DETECTED NOT DETECTED Final   Astrovirus NOT DETECTED NOT DETECTED Final   Norovirus GI/GII NOT DETECTED NOT DETECTED Final   Rotavirus A NOT  DETECTED NOT DETECTED Final   Sapovirus (I, II, IV, and V) NOT DETECTED NOT DETECTED Final    Comment: Performed at [facility], [address] [place], [place] [address]    Labs: CBC: Recent Labs  Lab 01/21/24 1422 01/22/24 0554 01/23/24 0358 01/24/24 0337  WBC 13.0* 8.3 6.6 5.8  HGB 12.7* 9.4* 10.4* 10.4*  HCT 37.8* 28.2* 32.0* 31.5*  MCV 88.5 89.8 90.4 88.2  PLT 200 136* 155 188   Basic Metabolic Panel: Recent Labs  Lab 01/21/24 1422 01/22/24 0554 01/23/24 0358 01/24/24 0337 01/25/24 0603  NA 133* 134* 134* 135 136  K 4.0 3.7 4.0 4.1 4.1  CL 100 104 104 101 100  CO2 24 22 23 24 27   GLUCOSE 100* 107* 282* 304* 289*  BUN 27* 22 21 18 15   CREATININE 1.42* 1.16 1.59* 1.35* 1.39*  CALCIUM 8.4* 7.4* 7.7* 8.0* 8.3*  MG 2.0  --  1.7 1.9 1.7  PHOS 2.2*  --  2.4* 2.7  --    Liver Function Tests: Recent Labs  Lab 01/21/24 1422  AST 24  ALT 22  ALKPHOS 101  BILITOT 1.2  PROT 6.7  ALBUMIN 3.7   CBG: Recent Labs  Lab 01/24/24 1230 01/24/24 1642 01/24/24 2049 01/24/24 2224 01/25/24 0756  GLUCAP 189* 171* 308* 316* 274*    Discharge time spent: greater than 30 minutes.  Signed: Leita Blanch, MD [facility] 01/25/2024

## 2024-01-25 NOTE — Discharge Instructions (Signed)
 Keep your scheduled appt with [facility] Keep log of your sugars and review with Endocrinology

## 2024-01-25 NOTE — TOC Transition Note (Signed)
 Transition of Care [facility]) - Discharge Note   Patient Details  Name: Jared Hess MRN: [id number] Date of Birth: 10-18-1950  [facility] CM/SW Contact:  Lauraine JAYSON Carpen, LCSW Phone Number: 01/25/2024, 10:21 AM   Clinical Narrative:   Patient has orders to discharge home today. No further concerns. CSW signing off.  Final next level of care: Home/Self Care Barriers to Discharge: Barriers Resolved   Patient Goals and CMS Choice            Discharge Placement                Patient to be transferred to facility by: Wife   Patient and family notified of of transfer: 01/25/24  Discharge Plan and Services Additional resources added to the After Visit Summary for                                       Social Drivers of Health (SDOH) Interventions SDOH Screenings   Food Insecurity: No Food Insecurity (01/22/2024)  Housing: Low Risk  (01/22/2024)  Transportation Needs: No Transportation Needs (01/22/2024)  Utilities: Not At Risk (01/22/2024)  Financial Resource Strain: Low Risk  (02/22/2023)   [facility] from [facility]  Social Connections: Unknown (01/22/2024)  Tobacco Use: Low Risk  (01/21/2024)     Readmission Risk Interventions    01/24/2024    3:16 PM  Readmission Risk Prevention Plan  Transportation Screening Complete  PCP or Specialist Appt within 3-5 Days Complete  Social Work Consult for Recovery Care Planning/Counseling Complete  Palliative Care Screening Not Applicable  Medication Review Oceanographer) Complete

## 2024-01-25 NOTE — Inpatient Diabetes Management (Signed)
 Inpatient Diabetes Program Recommendations  AACE/ADA: New Consensus Statement on Inpatient Glycemic Control (2015)  Target Ranges:  Prepandial:   less than 140 mg/dL      Peak postprandial:   less than 180 mg/dL (1-2 hours)      Critically ill patients:  140 - 180 mg/dL    Latest Reference Range & Units 01/24/24 08:32 01/24/24 12:30 01/24/24 16:42 01/24/24 20:49 01/24/24 22:24  Glucose-Capillary 70 - 99 mg/dL 695 (H)  7 units Novolog   189 (H)  2 units Novolog   171 (H)  2 units Novolog   308 (H) 316 (H)  4 units Novolog   15 units Semglee   (H): Data is abnormally high  Latest Reference Range & Units 01/25/24 07:56  Glucose-Capillary 70 - 99 mg/dL 725 (H)  5 units Novolog    (H): Data is abnormally high  Admit:  Hallucination, nausea, vomiting, abdominal pain  C. difficile colitis  Left ankle and hand pain and Jerking   History: Type 1 diabetes, Dementia, CKD3  Home DM Meds: Lantus  20 units at HS       Admelog 0-6 units TID with meals  Current Orders: Semglee  15 units at HS      Novolog  Sensitive Correction Scale/ SSI (0-9 units) TID AC + HS    MD- Please consider:  1. Increase Semglee  to 20 units at HS (home dose)  2. Start low dose Novolog  Meal Coverage: Novolog  2 units TID with meals HOLD if pt NPO HOLD if pt eats <50% meals   ENDO: Meta Mean, NP with [facility] Last Seen 07/25/2023 Latent autoimmune diabetes mellitus  Current diabetes regimen: -Tresiba 20 units bedtime  -Admelog 0-4 units before meals (if eating 50-60 grams on average) then 4-6 units to correct if sugar is high hours later - averages 12 units per day per wife    --Will follow patient during hospitalization--  Adina Rudolpho Arrow RN, MSN, CDCES Diabetes Coordinator Inpatient Glycemic Control Team Team Pager: [phone] (8a-5p)

## 2024-01-26 LAB — CULTURE, BLOOD (ROUTINE X 2)
Culture: NO GROWTH
Culture: NO GROWTH
Special Requests: ADEQUATE
Special Requests: ADEQUATE

## 2024-02-07 LAB — MISC LABCORP TEST (SEND OUT): Labcorp test code: 9985

## 2024-05-31 ENCOUNTER — Ambulatory Visit: Payer: Self-pay

## 2024-05-31 DIAGNOSIS — F4310 Post-traumatic stress disorder, unspecified: Secondary | ICD-10-CM | POA: Insufficient documentation
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
## 2024-06-18 ENCOUNTER — Ambulatory Visit

## 2024-06-18 DIAGNOSIS — F4310 Post-traumatic stress disorder, unspecified: Secondary | ICD-10-CM
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
## 2024-06-25 ENCOUNTER — Ambulatory Visit (INDEPENDENT_AMBULATORY_CARE_PROVIDER_SITE_OTHER)

## 2024-06-25 DIAGNOSIS — F4310 Post-traumatic stress disorder, unspecified: Secondary | ICD-10-CM
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
## 2024-06-28 NOTE — Progress Notes (Signed)
" ° °  THERAPIST PROGRESS NOTE  Session Time: 74  Participation Level: Active  Behavioral Response: CasualAlertEuthymic  Type of Therapy: Individual Therapy  Treatment Goals addressed: STG: Jared Hess will participate in EMDR and other trauma resolution therapies to reduce the intensity of trauma reactivity from a 10 to no more than a 2 or 3.  STG: Jared Hess will develop and learn emotion regulation skills to manage the symptoms of trauma reactivity to reduce disturbance from daily to no more than 1 time per week.   ProgressTowards Goals: Progressing  Interventions: CBT  Summary: Jared Hess is a 74 y.o. male who presents with a history of trauma. Therapist greeted Jared Hess warmly and spent a few minutes checking in and discussing how things have been since the last session. Jared Hess presents for session alert and oriented; mood and affect neutral. Speech is clear and coherent at normal rate and tone. Engaged and cooperative in session. Therapist spent the first part of session reviewing treatment plan and goals to assess management of behavioral symptoms and any safety concerns and current level of functioning. Jared Hess denies all safety concerns and continues to work towards goals.    Therapist spent the second part of session on psychoeducation, skill development and therapeutic interventions in session. Jared Hess shared that he has not had any trauma reactivity since EMDR last week.  Jared Hess reported he has been having some difficulty with memory loss and is working with a furniture conservator/restorer and his primary care.  Therapist used some CBT and supportive strategies to help Jared Hess think through steps to cope with symptoms of memory loss.  Motivational interviewing was used to help Jared Hess, with strategies Jared Hess responded well to the interventions and shared that they sound good and he was actually using zolpidem already. A wrap up meditation activity was done at the end of the session and Jared Hess  responded well to it.   Suicidal/Homicidal: Nowithout intent/plan  Therapist Response: Therapist used Motivational Interviewing to facilitate discussion, elicit pertinent information and summarized thoughts and feelings for clarity and accuracy. Used supportive language and validation to provide a safe space to process thoughts and feelings. Therapist used CBT and supportive strategies as well as motivational interviewing in session as therapeutic techniques to address presenting concerns. Therapist noted trauma reactivity remained at a 0 and Jared Hess had not experienced any reactivity symptoms since EMDR last week.  Therapist reviewed and summarized what was discussed in session today and asked Jared Hess if there were any questions and provided clarity as needed. No safety concerns were noted during session  and SI/HI/AVH were not present. Homework was assigned. Therapist wrapped up with a mindfulness based mindfulness activity and a follow up meeting time was scheduled.   Plan: Return again in 5 weeks.  Diagnosis: PTSD (post-traumatic stress disorder)  Collaboration of Care: Other Patient not currently under med management for behavioral concerns. No collaboration of care done at this time.  Patient/Guardian was advised Release of Information must be obtained prior to any record release in order to collaborate their care with an outside provider. Patient/Guardian was advised if they have not already done so to contact the registration department to sign all necessary forms in order for us  to release information regarding their care.   Consent: Patient/Guardian gives verbal consent for treatment and assignment of benefits for services provided during this visit. Patient/Guardian expressed understanding and agreed to proceed.   Jared Hess, Jared Hess 07/02/2024  "

## 2024-07-02 ENCOUNTER — Ambulatory Visit (INDEPENDENT_AMBULATORY_CARE_PROVIDER_SITE_OTHER)

## 2024-07-02 DIAGNOSIS — F4310 Post-traumatic stress disorder, unspecified: Secondary | ICD-10-CM

## 2024-08-09 ENCOUNTER — Ambulatory Visit
